# Patient Record
Sex: Male | Born: 1955
Health system: Southern US, Community
[De-identification: ages and names within clinical notes are randomized; demographics above are authoritative.]

## PROBLEM LIST (undated history)

## (undated) DIAGNOSIS — Z8719 Personal history of other diseases of the digestive system: Secondary | ICD-10-CM

## (undated) DIAGNOSIS — R51 Headache: Secondary | ICD-10-CM

## (undated) DIAGNOSIS — N400 Enlarged prostate without lower urinary tract symptoms: Secondary | ICD-10-CM

## (undated) DIAGNOSIS — K219 Gastro-esophageal reflux disease without esophagitis: Secondary | ICD-10-CM

## (undated) DIAGNOSIS — R519 Headache, unspecified: Secondary | ICD-10-CM

## (undated) DIAGNOSIS — K5792 Diverticulitis of intestine, part unspecified, without perforation or abscess without bleeding: Secondary | ICD-10-CM

## (undated) DIAGNOSIS — M1990 Unspecified osteoarthritis, unspecified site: Secondary | ICD-10-CM

## (undated) DIAGNOSIS — J449 Chronic obstructive pulmonary disease, unspecified: Secondary | ICD-10-CM

## (undated) HISTORY — DX: Unspecified osteoarthritis, unspecified site: M19.90

## (undated) HISTORY — DX: Diverticulitis of intestine, part unspecified, without perforation or abscess without bleeding: K57.92

## (undated) HISTORY — PX: NO PAST SURGERIES: SHX2092

## (undated) HISTORY — DX: Chronic obstructive pulmonary disease, unspecified: J44.9

---

## 2001-07-24 ENCOUNTER — Encounter: Admission: RE | Admit: 2001-07-24 | Discharge: 2001-10-22 | Payer: Self-pay | Admitting: Family Medicine

## 2002-02-12 ENCOUNTER — Encounter: Admission: RE | Admit: 2002-02-12 | Discharge: 2002-05-13 | Payer: Self-pay | Admitting: Family Medicine

## 2002-08-04 ENCOUNTER — Ambulatory Visit (HOSPITAL_COMMUNITY): Admission: RE | Admit: 2002-08-04 | Discharge: 2002-08-04 | Payer: Self-pay | Admitting: Family Medicine

## 2002-08-05 ENCOUNTER — Ambulatory Visit (HOSPITAL_BASED_OUTPATIENT_CLINIC_OR_DEPARTMENT_OTHER): Admission: RE | Admit: 2002-08-05 | Discharge: 2002-08-05 | Payer: Self-pay | Admitting: Family Medicine

## 2002-08-13 ENCOUNTER — Encounter: Admission: RE | Admit: 2002-08-13 | Discharge: 2002-11-11 | Payer: Self-pay | Admitting: Family Medicine

## 2002-11-25 ENCOUNTER — Emergency Department (HOSPITAL_COMMUNITY): Admission: EM | Admit: 2002-11-25 | Discharge: 2002-11-25 | Payer: Self-pay | Admitting: Emergency Medicine

## 2002-11-25 ENCOUNTER — Encounter: Payer: Self-pay | Admitting: Emergency Medicine

## 2012-09-11 ENCOUNTER — Emergency Department (HOSPITAL_COMMUNITY): Payer: Worker's Compensation

## 2012-09-11 ENCOUNTER — Encounter (HOSPITAL_COMMUNITY): Payer: Self-pay | Admitting: Family Medicine

## 2012-09-11 ENCOUNTER — Emergency Department (HOSPITAL_COMMUNITY)
Admission: EM | Admit: 2012-09-11 | Discharge: 2012-09-11 | Disposition: A | Discharge status: Home / Self Care | Payer: Worker's Compensation | Attending: Emergency Medicine | Admitting: Emergency Medicine

## 2012-09-11 DIAGNOSIS — Y9389 Activity, other specified: Secondary | ICD-10-CM | POA: Insufficient documentation

## 2012-09-11 DIAGNOSIS — F17200 Nicotine dependence, unspecified, uncomplicated: Secondary | ICD-10-CM | POA: Insufficient documentation

## 2012-09-11 DIAGNOSIS — Z23 Encounter for immunization: Secondary | ICD-10-CM | POA: Insufficient documentation

## 2012-09-11 DIAGNOSIS — Y9289 Other specified places as the place of occurrence of the external cause: Secondary | ICD-10-CM | POA: Insufficient documentation

## 2012-09-11 DIAGNOSIS — Y990 Civilian activity done for income or pay: Secondary | ICD-10-CM | POA: Insufficient documentation

## 2012-09-11 DIAGNOSIS — S61219A Laceration without foreign body of unspecified finger without damage to nail, initial encounter: Secondary | ICD-10-CM

## 2012-09-11 DIAGNOSIS — S61209A Unspecified open wound of unspecified finger without damage to nail, initial encounter: Secondary | ICD-10-CM | POA: Insufficient documentation

## 2012-09-11 DIAGNOSIS — W230XXA Caught, crushed, jammed, or pinched between moving objects, initial encounter: Secondary | ICD-10-CM | POA: Insufficient documentation

## 2012-09-11 DIAGNOSIS — S62609B Fracture of unspecified phalanx of unspecified finger, initial encounter for open fracture: Secondary | ICD-10-CM | POA: Insufficient documentation

## 2012-09-11 MED ORDER — CEPHALEXIN 500 MG PO CAPS: 500.0000 mg | ORAL_CAPSULE | Freq: Four times a day (QID) | ORAL | Status: DC

## 2012-09-11 MED ORDER — OXYCODONE-ACETAMINOPHEN 5-325 MG PO TABS
2.0000 | ORAL_TABLET | Freq: Once | ORAL | Status: AC
  Administered 2012-09-11: 2 via ORAL
  Filled 2012-09-11: qty 2

## 2012-09-11 MED ORDER — TETANUS-DIPHTH-ACELL PERTUSSIS 5-2.5-18.5 LF-MCG/0.5 IM SUSP
0.5000 mL | Freq: Once | INTRAMUSCULAR | Status: AC
  Administered 2012-09-11: 0.5 mL via INTRAMUSCULAR
  Filled 2012-09-11 (×2): qty 0.5

## 2012-09-11 MED ORDER — HYDROCODONE-ACETAMINOPHEN 5-325 MG PO TABS: 1.0000 | ORAL_TABLET | Freq: Four times a day (QID) | ORAL | Status: DC | PRN

## 2012-09-11 NOTE — ED Notes (Signed)
MD at bedside. 

## 2012-09-11 NOTE — ED Notes (Signed)
Per pt closed finger in a door at work. Pt has lac to left small finger. Still bleeding.

## 2012-09-11 NOTE — ED Provider Notes (Signed)
History     CSN: [id number]  Arrival date & time 09/11/12  1229   First MD Initiated Contact with Patient 09/11/12 1340      Chief Complaint  Patient presents with  . Extremity Laceration    (Consider location/radiation/quality/duration/timing/severity/associated sxs/prior treatment) HPI Comments: Patient is a 57 year old male who presents for pain to his distal left fifth finger. Patient states that symptoms began 2 hours ago after closing his finger in a door at work. Patient states the pain is constant and throbbing in nature, aggravated with palpation, and without alleviating factors. Patient with laceration to distal phalanx 2/2 mechanical injury. Bleeding controlled. Patient denies fevers, joint swelling, numbness or tingling in his distal phalanx, and weakness in his right hand or fingers. Patient unsure of the date his last tetanus shot.  The history is provided by the patient. No language interpreter was used.    History reviewed. No pertinent past medical history.  History reviewed. No pertinent past surgical history.  History reviewed. No pertinent family history.  History  Substance Use Topics  . Smoking status: Current Every Day Smoker  . Smokeless tobacco: Not on file  . Alcohol Use: No     Review of Systems  Constitutional: Negative for fever.  Musculoskeletal: Positive for myalgias and arthralgias. Negative for joint swelling.  Skin: Positive for wound. Negative for color change and pallor.  Neurological: Negative for weakness and numbness.  All other systems reviewed and are negative.    Allergies  Review of patient's allergies indicates no known allergies.  Home Medications   Current Outpatient Rx  Name  Route  Sig  Dispense  Refill  . acetaminophen (TYLENOL) 325 MG tablet   Oral   Take by mouth every 6 (six) hours as needed for pain.         . Multiple Vitamins-Minerals (MULTIVITAMIN WITH MINERALS) tablet   Oral   Take 1 tablet by mouth  daily.         . cephALEXin (KEFLEX) 500 MG capsule   Oral   Take 1 capsule (500 mg total) by mouth 4 (four) times daily.   40 capsule   0   . HYDROcodone-acetaminophen (NORCO/VICODIN) 5-325 MG per tablet   Oral   Take 1 tablet by mouth every 6 (six) hours as needed for pain.   15 tablet   0     BP 143/76  Pulse 73  Temp(Src) 98.1 F (36.7 C) (Oral)  Resp 16  SpO2 99%  Physical Exam  Nursing note and vitals reviewed. Constitutional: He is oriented to person, place, and time. He appears well-developed and well-nourished. No distress.  HENT:  Head: Normocephalic and atraumatic.  Eyes: Conjunctivae and EOM are normal. No scleral icterus.  Neck: Normal range of motion.  Cardiovascular: Normal rate, regular rhythm and intact distal pulses.   Distal radial pulses 2+ bilaterally. Capillary refill normal and RUE and L 5th finger.   Pulmonary/Chest: Effort normal. No respiratory distress.  Musculoskeletal: Normal range of motion. He exhibits tenderness.  3 cm laceration to the fifth left distal phalanx extending from the lateral edge of the nailbed to finger pad distal to DIP joint. There is no pallor and capillary refill is normal. 5/5 strength against resistance of FDP, FDS, and extensors. +TTP  Neurological: He is alert and oriented to person, place, and time.  Equal grip strength bilaterally with no sensory or motor deficits appreciated  Skin: Skin is warm and dry. No rash noted. He is not diaphoretic. No  erythema. No pallor.  Psychiatric: He has a normal mood and affect. His behavior is normal.    ED Course  Procedures (including critical care time)  Labs Reviewed - No data to display Dg Finger Little Left  09/11/2012   *RADIOLOGY REPORT*  Clinical Data: Recent traumatic injury to the distal fifth digit  LEFT LITTLE FINGER 2+V  Comparison: None.  Findings: There is soft tissue abnormality consistent with the patient's given clinical history.  Fracture of the phalangeal  tuft laterally is noted with mild displacement.  No other focal abnormality is seen.  IMPRESSION: Fifth Distal phalangeal tuft fracture with associated soft tissue injury.   Original Report Authenticated By: Alcide Clever, M.D.   LACERATION REPAIR Performed by: Jari Favre PA-S Authorized by: Antony Madura Consent: Verbal consent obtained. Risks and benefits: risks, benefits and alternatives were discussed Consent given by: patient Patient identity confirmed: provided demographic data Prepped and Draped in normal sterile fashion Wound explored  Laceration Location: Distal L 5th finger  Laceration Length: 3cm  No Foreign Bodies seen or palpated  Anesthesia: digital block  Local anesthetic: lidocaine 2% without epinephrine  Anesthetic total: 3.5 ml  Irrigation method: syringe Amount of cleaning: standard  Skin closure: 4-0 Chromic  Number of sutures: 7  Technique: 2 subcutaneous and 5 simple interrupted  Patient tolerance: Patient tolerated the procedure well with no immediate complications.   1. Finger laceration, initial encounter   2. Open fracture of distal phalangeal tuft, initial encounter     MDM  Uncomplicated finger laceration with open tuft's fracture of L little finger. Patient neurovascularly intact with 5 out of 5 strength against resistance of FDS, FDP, and extensors. There is no pallor, pulselessness, poikilothermia, or paresthesias appreciated. Laceration closed with 4-0 chromic sutures in ED and dressed with gauze dressing. Patient appropriate for discharge with hand specialist followup and course of Keflex to prevent wound infection and osteomyelitis. Norco prescribed as needed for pain control. Indications for ED return provided. Patient verbalizes comfort and understanding with plan with no unaddressed concerns.        Antony Madura, PA-C 09/11/12 [phone]

## 2012-09-11 NOTE — ED Notes (Signed)
PT comfortable with d/c and f/u instructions. Prescriptions x2 

## 2012-09-12 NOTE — ED Provider Notes (Signed)
Medical screening examination/treatment/procedure(s) were performed by non-physician practitioner and as supervising physician I was immediately available for consultation/collaboration.   Carleene Cooper III, MD 09/12/12 1007

## 2013-11-04 ENCOUNTER — Emergency Department (HOSPITAL_COMMUNITY)
Admission: EM | Admit: 2013-11-04 | Discharge: 2013-11-04 | Disposition: A | Discharge status: Home / Self Care | Payer: 59 | Attending: Emergency Medicine | Admitting: Emergency Medicine

## 2013-11-04 ENCOUNTER — Encounter (HOSPITAL_COMMUNITY): Payer: Self-pay | Admitting: Emergency Medicine

## 2013-11-04 ENCOUNTER — Emergency Department (HOSPITAL_COMMUNITY): Payer: 59

## 2013-11-04 DIAGNOSIS — F17200 Nicotine dependence, unspecified, uncomplicated: Secondary | ICD-10-CM | POA: Diagnosis not present

## 2013-11-04 DIAGNOSIS — R079 Chest pain, unspecified: Secondary | ICD-10-CM | POA: Insufficient documentation

## 2013-11-04 DIAGNOSIS — B359 Dermatophytosis, unspecified: Secondary | ICD-10-CM | POA: Diagnosis not present

## 2013-11-04 DIAGNOSIS — Z79899 Other long term (current) drug therapy: Secondary | ICD-10-CM | POA: Insufficient documentation

## 2013-11-04 DIAGNOSIS — R071 Chest pain on breathing: Secondary | ICD-10-CM | POA: Insufficient documentation

## 2013-11-04 LAB — CBC
HEMATOCRIT: 46.1 % (ref 39.0–52.0)
Hemoglobin: 15.5 g/dL (ref 13.0–17.0)
MCH: 30.9 pg (ref 26.0–34.0)
MCHC: 33.6 g/dL (ref 30.0–36.0)
MCV: 91.8 fL (ref 78.0–100.0)
Platelets: 260 10*3/uL (ref 150–400)
RBC: 5.02 MIL/uL (ref 4.22–5.81)
RDW: 13.2 % (ref 11.5–15.5)
WBC: 9.6 10*3/uL (ref 4.0–10.5)

## 2013-11-04 LAB — I-STAT TROPONIN, ED
TROPONIN I, POC: 0 ng/mL (ref 0.00–0.08)
Troponin i, poc: 0 ng/mL (ref 0.00–0.08)

## 2013-11-04 LAB — BASIC METABOLIC PANEL
Anion gap: 14 (ref 5–15)
BUN: 16 mg/dL (ref 6–23)
CHLORIDE: 105 meq/L (ref 96–112)
CO2: 26 mEq/L (ref 19–32)
Calcium: 9.4 mg/dL (ref 8.4–10.5)
Creatinine, Ser: 1 mg/dL (ref 0.50–1.35)
GFR calc Af Amer: >90 mL/min (ref >90)
GFR calc non Af Amer: 80 mL/min — ABNORMAL LOW (ref >90)
GLUCOSE: 99 mg/dL (ref 70–99)
Potassium: 4.6 mEq/L (ref 3.7–5.3)
Sodium: 145 mEq/L (ref 137–147)

## 2013-11-04 MED ORDER — KETOROLAC TROMETHAMINE 60 MG/2ML IM SOLN
60.0000 mg | Freq: Once | INTRAMUSCULAR | Status: AC
  Administered 2013-11-04: 60 mg via INTRAMUSCULAR
  Filled 2013-11-04: qty 2

## 2013-11-04 MED ORDER — CLOTRIMAZOLE 1 % EX CREA: TOPICAL_CREAM | CUTANEOUS | Status: DC

## 2013-11-04 MED ORDER — OXYCODONE-ACETAMINOPHEN 5-325 MG PO TABS: 1.0000 | ORAL_TABLET | ORAL | Status: DC | PRN

## 2013-11-04 NOTE — Discharge Instructions (Signed)
Chest Pain (Nonspecific) °It is often hard to give a specific diagnosis for the cause of chest pain. There is always a chance that your pain could be related to something serious, such as a heart attack or a blood clot in the lungs. You need to follow up with your health care provider for further evaluation. °CAUSES  °· Heartburn. °· Pneumonia or bronchitis. °· Anxiety or stress. °· Inflammation around your heart (pericarditis) or lung (pleuritis or pleurisy). °· A blood clot in the lung. °· A collapsed lung (pneumothorax). It can develop suddenly on its own (spontaneous pneumothorax) or from trauma to the chest. °· Shingles infection (herpes zoster virus). °The chest wall is composed of bones, muscles, and cartilage. Any of these can be the source of the pain. °· The bones can be bruised by injury. °· The muscles or cartilage can be strained by coughing or overwork. °· The cartilage can be affected by inflammation and become sore (costochondritis). °DIAGNOSIS  °Lab tests or other studies may be needed to find the cause of your pain. Your health care provider may have you take a test called an ambulatory electrocardiogram (ECG). An ECG records your heartbeat patterns over a 24-hour period. You may also have other tests, such as: °· Transthoracic echocardiogram (TTE). During echocardiography, sound waves are used to evaluate how blood flows through your heart. °· Transesophageal echocardiogram (TEE). °· Cardiac monitoring. This allows your health care provider to monitor your heart rate and rhythm in real time. °· Holter monitor. This is a portable device that records your heartbeat and can help diagnose heart arrhythmias. It allows your health care provider to track your heart activity for several days, if needed. °· Stress tests by exercise or by giving medicine that makes the heart beat faster. °TREATMENT  °· Treatment depends on what may be causing your chest pain. Treatment may include: °¨ Acid blockers for  heartburn. °¨ Anti-inflammatory medicine. °¨ Pain medicine for inflammatory conditions. °¨ Antibiotics if an infection is present. °· You may be advised to change lifestyle habits. This includes stopping smoking and avoiding alcohol, caffeine, and chocolate. °· You may be advised to keep your head raised (elevated) when sleeping. This reduces the chance of acid going backward from your stomach into your esophagus. °Most of the time, nonspecific chest pain will improve within 2-3 days with rest and mild pain medicine.  °HOME CARE INSTRUCTIONS  °· If antibiotics were prescribed, take them as directed. Finish them even if you start to feel better. °· For the next few days, avoid physical activities that bring on chest pain. Continue physical activities as directed. °· Do not use any tobacco products, including cigarettes, chewing tobacco, or electronic cigarettes. °· Avoid drinking alcohol. °· Only take medicine as directed by your health care provider. °· Follow your health care provider's suggestions for further testing if your chest pain does not go away. °· Keep any follow-up appointments you made. If you do not go to an appointment, you could develop lasting (chronic) problems with pain. If there is any problem keeping an appointment, call to reschedule. °SEEK MEDICAL CARE IF:  °· Your chest pain does not go away, even after treatment. °· You have a rash with blisters on your chest. °· You have a fever. °SEEK IMMEDIATE MEDICAL CARE IF:  °· You have increased chest pain or pain that spreads to your arm, neck, jaw, back, or abdomen. °· You have shortness of breath. °· You have an increasing cough, or you cough   up blood. °· You have severe back or abdominal pain. °· You feel nauseous or vomit. °· You have severe weakness. °· You faint. °· You have chills. °This is an emergency. Do not wait to see if the pain will go away. Get medical help at once. Call your local emergency services (911 in [place] Do not drive  yourself to the hospital. °MAKE SURE YOU:  °· Understand these instructions. °· Will watch your condition. °· Will get help right away if you are not doing well or get worse. °Document Released: 12/21/2004 Document Revised: 03/18/2013 Document Reviewed: 10/17/2007 °ExitCare® Patient Information ©2015 [employer]. This information is not intended to replace advice given to you by your health care provider. Make sure you discuss any questions you have with your health care provider. ° ° °Emergency Department Resource Guide °1) Find a Doctor and Pay Out of Pocket °Although you won't have to find out who is covered by your insurance plan, it is a good idea to ask around and get recommendations. You will then need to call the office and see if the doctor you have chosen will accept you as a new patient and what types of options they offer for patients who are self-pay. Some doctors offer discounts or will set up payment plans for their patients who do not have insurance, but you will need to ask so you aren't surprised when you get to your appointment. ° °2) Contact Your Local Health Department °Not all health departments have doctors that can see patients for sick visits, but many do, so it is worth a call to see if yours does. If you don't know where your local health department is, you can check in your phone book. The CDC also has a tool to help you locate your state's health department, and many state websites also have listings of all of their local health departments. ° °3) Find a Walk-in Clinic °If your illness is not likely to be very severe or complicated, you may want to try a walk in clinic. These are popping up all over the country in pharmacies, drugstores, and shopping centers. They're usually staffed by nurse practitioners or physician assistants that have been trained to treat common illnesses and complaints. They're usually fairly quick and inexpensive. However, if you have serious medical issues or  chronic medical problems, these are probably not your best option. ° °No Primary Care Doctor: °- Call [facility] at  832-8000 - they can help you locate a primary care doctor that  accepts your insurance, provides certain services, etc. °- Physician Referral Service- 1-800-533-3463 ° °Chronic Pain Problems: °Organization         Address  Phone   Notes  ° [facility]  (336) 297-2271 Patients need to be referred by their primary care doctor.  ° °Medication Assistance: °Organization         Address  Phone   Notes  °[place] [facility] [address] °[place], [place] [address] (336) 641-8030 --Must be a resident of [place] °-- Must have NO insurance coverage whatsoever (no Medicaid/ Medicare, etc.) °-- The pt. MUST have a primary care doctor that directs their care regularly and follows them in the community °  °[facility]  (866) 331-1348   °[employer]  (888) 892-1162   ° °Agencies that provide inexpensive medical care: °Organization         Address  Phone   Notes  °[facility]  (  336) 832-8035   °[facility]    (336) 832-7272   °[facility] [address] °[place], [place] [address] (336) 832-4777   °[facility] [address], °[place] (336) 271-4999   °[facility]    (336) 373-0678   °[facility]    (336) 272-1050   °[facility] ° [address], [place] Phone:  (336) 832-4444, Fax:  (336) 832-4440 Hours of Operation:  9 am - 6 pm, M-F.  Also accepts Medicaid/Medicare and self-pay.  °[facility] ° [address], [place] Phone: (336) 832-3150, Fax: (336) [phone]. Hours of Operation:  8:30 am - 5:30 pm, M-F.  Also accepts Medicaid and self-pay.  °[facility] [place] [address], [place] Phone: (336) 878-6027   °[facility] [address], [place], [place]  (336)723-1848, Ext. 123 Mondays & Thursdays: 7-9 AM.  First 15 patients are seen on a first come, first serve basis. °  ° °Medicaid-accepting [place] Providers: ° °Organization         Address  Phone   Notes  °[facility] [address], [place] (336) 641-2100 Also accepts self-pay patients.  °[facility] [address], [place] ° (336) 856-9996   °[facility] [address], [place] (336) 288-8857   °[facility] [address] [place] [address], [place] (336) 299-7000   °Veita Bland [address], [place]  ° (336) 373-1557 Only accepts [place] Access Medicaid patients after they have their name applied to their card.  ° °Self-Pay (no insurance) in [place]: ° °Organization         Address  Phone   Notes  °Sickle Cell Patients, [facility] [address], [place] (336) 832-1970   °[facility] [address], [place] (336) 832-4400   ° [facility] ° [address], [place] (336) 992-4800   °[facility]/Dr. Osei-Bonsu ° [address] [place] [address], [place] or [address], [place] (336) [phone] Phone number for both [place] and [place] locations is the same.  °[facility] [address], [place] (336) 299-0000   °[facility] [address] [place] [address], [place] or [address] (336) 852-7530 °(336) 878-2260   °[facility] [address], [place] (336) 350-1642, phone; (336) 294-5005, fax Sees patients 1st and 3rd Saturday of every month.  Must not qualify for public or private insurance (i.e. Medicaid, Medicare, [place] Health Choice, Veterans' Benefits) • Household income should be no more than 200% of the poverty level •The clinic cannot treat you if you are pregnant or think you are pregnant • Sexually transmitted  diseases are not treated at the clinic.  ° ° °Dental Care: °Organization         Address  Phone  Notes  °[facility] [address],  (336) 641-6152 Accepts children up to age 21 who are enrolled in Medicaid or [place] Health Choice; pregnant women with a Medicaid card; and children who have applied for Medicaid or  Health Choice, but were declined, whose parents can pay a reduced fee at time of service.  °[facility] [place]    [address], [place] (336) 641-7733 Accepts children up to age 21 who are enrolled in Medicaid or [place] Health Choice; pregnant women with a Medicaid card; and children who have applied for Medicaid or [place] Health Choice, but were declined, whose parents can pay a reduced fee at time of service.  °[facility] ° [address], [place] (336) 641-4533 Patients are seen by appointment only. Walk-ins are not accepted. [facility] will see patients 18 years of age and older. °Monday - Tuesday (8am-5pm) °Most Wednesdays (8:30-5pm) °$30 per visit, cash only  °[facility] ° [address], [place] (336) 641-4533 Patients are seen by appointment only. Walk-ins are not accepted. [facility] will see patients 18 years of age and older. °One Wednesday Evening (Monthly: Volunteer Based).  $30 per visit, cash only  °[facility]  (919) 537-3737 for adults; Children under age 4, call Graduate Pediatric Dentistry at ([phone]) [phone]. Children aged 4-14, please call (919) [phone] to request a pediatric application. ° Dental services are provided in all areas of dental care including fillings, crowns and bridges, complete and partial dentures, implants, gum treatment, root canals, and extractions. Preventive care is also provided. Treatment is provided to both adults and children. °Patients are selected via a  lottery and there is often a waiting list. °  °[facility] [address], °[place] ° (336) 763-8833 [email or web address] °  °[facility] [address], [place], [place] (336)723-1848, Ext. 123 Second and Fourth Thursday of each month, opens at 6:30 AM; Clinic ends at 9 AM.  Patients are seen on a first-come first-served basis, and a limited number are seen during each clinic.  ° °[facility] ° [address], [place], [place] (336) 723-7904   Eligibility Requirements °You must have lived in [place], [place], or [place] counties for at least the last three months. °  You cannot be eligible for state or federal sponsored healthcare insurance, including Veterans Administration, Medicaid, or Medicare. °  You generally cannot be eligible for healthcare insurance through your employer.  °  How to apply: °Eligibility screenings are held every Tuesday and Wednesday afternoon from 1:00 pm until 4:00 pm. You do not need an appointment for the interview!  °[facility] [address], [place], [place] 336-631-2330   °[facility]  336-342-8273   °[facility]  336-703-3100   °[facility]  336-570-6415   ° °Behavioral Health Resources in the Community: °Intensive Outpatient Programs °Organization         Address  Phone  Notes  °[facility] [address], [place], [place] 336-878-6098   °[facility] [address], [place], [place] 336-832-9800   °[facility] [address], [place], [place] ° 336-882-2125   °[facility] [address],  °[place], [place] 1-800-853-5163 or 336-641-4981   °Substance Abuse Resources °Organization         Address  Phone  Notes  °[facility]  336-882-2125   °[facility]  336-784-9470   °[facility] [place] [facility]  336-285-9073   °[facility]  336-845-3988   °Residential &  Outpatient Substance Abuse Program  1-800-659-3381   °Psychological Services °Organization         Address  Phone  Notes  °[facility]  336- 832-9600   °  [facility]  336- 378-7881   °[facility] [address], [place] 1-800-853-5163 or 336-641-4981   ° °Mobile Crisis Teams °Organization         Address  Phone  Notes  °[facility], Mobile Crisis Care Unit  1-877-626-1772   °Assertive °[facility] ° [address] [place], [place] 336-834-9664   °Sharon DeEsch [address] °[place] 336-554-5454   ° °Self-Help/Support Groups °Organization         Address  Phone             Notes  °[facility] [place] - variety of support groups  336- 373-1402 Call for more information  °Narcotics Anonymous (NA), [facility] [address], °[place]  2 meetings at this location  ° °Residential Treatment Programs °Organization         Address  Phone  Notes  °[facility] [address],    °[place]  1-866-801-8205   °[facility] ° [address], [place], [place] 704-293-8524   °[facility] [address], [place] 336-845-3988 Admissions: 8am-3pm M-F  °[facility] [address]    °[place], [place] 336-841-1104   °[facility] [address], [place], [place] 336-379-7146   °[facility] [place] [facility] [address]  °[place], [place] 336-285-9073   °[facility] - Intensive Outpatient [address], [place], [place] 336-852-3033   °[facility] [address]  °[place] 1-877-615-2722 or 336-784-9470   °[facility] [address] [place], [place] 336-227-7417 Accepts Medicaid  °[facility] [address]  °[place] 1-800-659-3381 Substance Abuse/Addiction Treatment  ° °[place] Behavioral Health Resources °Organization          Address  Phone  Notes  °[facility]  (888) 581-9988   °Julie Brannon, PhD [address] [place], [place]   (336) 349-5553 or (336) 951-0000   °[facility]   [address] °[place], [place] (336) 349-4454   °[facility] [address], [place], [place] (336) 342-8316 Insurance/Medicaid/sponsorship through [facility]  °[facility] [address]                                    [place], [place] (336) 342-8316 Therapy/tele-psych/case  °[facility] [address]  ° [place], [place] (336) 349-2233    °Dr. Arfeen  (336) 349-4544   °[facility] [place]  [employer] [place] [facility] 1) [address], [place] °2) [address], [place] °3)  [address], [place] (336) 349-3220 °(336) 342-7768 ° °(336) 342-8140   °[place] [facility] (336) 342-1394 or (336) 342-3537 (After Hours)    ° ° ° °

## 2013-11-04 NOTE — ED Notes (Signed)
Pt c/o left sided CP worse with some movements and inspiration; pt sts some SOB

## 2013-11-04 NOTE — ED Notes (Signed)
MD in to evaluate rash.

## 2013-11-04 NOTE — ED Notes (Signed)
Pt reports he has had a rash on lower back, buttocks and upper posterior legs x 6 months, baseball size red raised rash on right side near axillary area since Friday night.  Itching, sometimes burns.

## 2013-11-05 NOTE — ED Provider Notes (Addendum)
CSN: [id number]     Arrival date & time 11/04/13  1705 History   First MD Initiated Contact with Patient 11/04/13 1842     Chief Complaint  Patient presents with  . Chest Pain     (Consider location/radiation/quality/duration/timing/severity/associated sxs/prior Treatment) Patient is a 58 y.o. male presenting with chest pain. The history is provided by the patient.  Chest Pain Pain location:  L lateral chest Pain quality: aching   Pain radiates to:  Does not radiate Pain radiates to the back: no   Pain severity:  Severe Onset quality:  Sudden Timing:  Constant Progression:  Unchanged Chronicity:  New Context comment:  Was pulling himself into his truck with his L arm and began having L sided chest pains Relieved by:  Rest Worsened by:  Movement Ineffective treatments:  None tried Associated symptoms: no abdominal pain, no back pain, no cough, no dizziness, no fever, no shortness of breath and not vomiting     History reviewed. No pertinent past medical history. History reviewed. No pertinent past surgical history. History reviewed. No pertinent family history. History  Substance Use Topics  . Smoking status: Current Every Day Smoker  . Smokeless tobacco: Not on file  . Alcohol Use: Yes     Comment: occ    Review of Systems  Constitutional: Negative for fever and chills.  Respiratory: Negative for cough and shortness of breath.   Cardiovascular: Positive for chest pain.  Gastrointestinal: Negative for vomiting and abdominal pain.  Musculoskeletal: Negative for back pain.  Neurological: Negative for dizziness.  All other systems reviewed and are negative.     Allergies  Review of patient's allergies indicates no known allergies.  Home Medications   Prior to Admission medications   Medication Sig Start Date End Date Taking? Authorizing Provider  Ascorbic Acid (VITAMIN C) 1000 MG tablet Take 1,000 mg by mouth daily.   Yes Historical Provider, MD   aspirin-acetaminophen-caffeine (EXCEDRIN MIGRAINE) [phone] MG per tablet Take 1 tablet by mouth every 6 (six) hours as needed for headache.   Yes Historical Provider, MD  Multiple Vitamins-Minerals (MULTIVITAMIN WITH MINERALS) tablet Take 1 tablet by mouth daily.   Yes Historical Provider, MD  clotrimazole (LOTRIMIN) 1 % cream Apply to affected area 2 times daily 11/04/13   Elwin MochaBlair Durward Matranga, MD  oxyCODONE-acetaminophen (PERCOCET) 5-325 MG per tablet Take 1 tablet by mouth every 4 (four) hours as needed. 11/04/13   Elwin MochaBlair Jhayden Demuro, MD   BP 120/70  Pulse 55  Temp(Src) 98.5 F (36.9 C) (Oral)  Resp 16  SpO2 96% Physical Exam  Constitutional: He is oriented to person, place, and time. He appears well-developed and well-nourished. No distress.  HENT:  Head: Normocephalic and atraumatic.  Mouth/Throat: Oropharynx is clear and moist. No oropharyngeal exudate.  Eyes: EOM are normal. Pupils are equal, round, and reactive to light.  Neck: Normal range of motion. Neck supple.  Cardiovascular: Normal rate and regular rhythm.  Exam reveals no friction rub.   No murmur heard. Pulmonary/Chest: Effort normal and breath sounds normal. No respiratory distress. He has no wheezes. He has no rales. He exhibits tenderness (L chest wall).  Abdominal: Soft. He exhibits no distension. There is no tenderness. There is no rebound.  Musculoskeletal: Normal range of motion. He exhibits no edema.  Neurological: He is alert and oriented to person, place, and time.  Skin: Skin is warm. Rash (No left chest wall rash. Does have annular patches of erythematous macules that are excoriated from scratching.. No surrounding cellulitis)  noted. He is not diaphoretic.    ED Course  Procedures (including critical care time) Labs Review Labs Reviewed  BASIC METABOLIC PANEL - Abnormal; Notable for the following:    GFR calc non Af Amer 80 (*)    All other components within normal limits  CBC  I-STAT TROPOININ, ED  Rosezena Sensor, ED    Imaging Review Dg Chest 2 View  11/04/2013   CLINICAL DATA:  Left-sided sharp chest pain. Shortness of breath. Smoker.  EXAM: CHEST  2 VIEW  COMPARISON:  None.  FINDINGS: Mild hyperinflation. Mild convex right thoracic spine curvature. Midline trachea. Normal heart size and mediastinal contours. No pleural effusion or pneumothorax. Mild biapical pleural thickening.  IMPRESSION: Mild hyperinflation/COPD.  No acute superimposed process.   Electronically Signed   By: Jeronimo Greaves M.D.   On: 11/04/2013 17:56     EKG Interpretation   Date/Time:  Tuesday November 04 2013 17:07:55 EDT Ventricular Rate:  75 PR Interval:  138 QRS Duration: 84 QT Interval:  356 QTC Calculation: 397 R Axis:   101 Text Interpretation:  Normal sinus rhythm Rightward axis Borderline ECG  Similar to prior Confirmed by Gwendolyn Grant  MD, Robin Petrakis ([id number]) on 11/05/2013  3:44:22 PM      MDM   Final diagnoses:  Chest pain, unspecified chest pain type  Ringworm    [name] here with L sided chest pain that began when using his L arm to get into his truck. Worse with movement, palpation. EKG clear. On exam, has L sided chest pain with palpation, diffusely through L lateral chest. No fevers, cough, N/V. Clear lungs. Exam and history c/w left chest wall pain. Serial troponins ok.  Also has ringworm on lower back, R lateral chest, given anti-fungal Rx. Stable for discharge.     Elwin Mocha, MD 11/05/13 [phone]

## 2017-06-05 ENCOUNTER — Ambulatory Visit: Payer: Self-pay | Admitting: Urology

## 2017-06-05 ENCOUNTER — Encounter: Payer: Self-pay | Admitting: Urology

## 2017-07-27 ENCOUNTER — Ambulatory Visit (INDEPENDENT_AMBULATORY_CARE_PROVIDER_SITE_OTHER): Payer: BLUE CROSS/BLUE SHIELD | Admitting: Urology

## 2017-07-27 ENCOUNTER — Encounter: Payer: Self-pay | Admitting: Urology

## 2017-07-27 VITALS — BP 143/82 | HR 68 | Ht 77.0 in | Wt 209.4 lb

## 2017-07-27 DIAGNOSIS — R3129 Other microscopic hematuria: Secondary | ICD-10-CM

## 2017-07-27 DIAGNOSIS — R39198 Other difficulties with micturition: Secondary | ICD-10-CM

## 2017-07-27 DIAGNOSIS — Z125 Encounter for screening for malignant neoplasm of prostate: Secondary | ICD-10-CM

## 2017-07-27 DIAGNOSIS — N400 Enlarged prostate without lower urinary tract symptoms: Secondary | ICD-10-CM

## 2017-07-27 LAB — BLADDER SCAN AMB NON-IMAGING: Scan Result: 132

## 2017-07-27 LAB — URINALYSIS, COMPLETE
BILIRUBIN UA: NEGATIVE
Glucose, UA: NEGATIVE
Ketones, UA: NEGATIVE
Leukocytes, UA: NEGATIVE
Nitrite, UA: NEGATIVE
PH UA: 6.5 (ref 5.0–7.5)
Protein, UA: NEGATIVE
Specific Gravity, UA: 1.015 (ref 1.005–1.030)
UUROB: 0.2 mg/dL (ref 0.2–1.0)

## 2017-07-27 LAB — MICROSCOPIC EXAMINATION
BACTERIA UA: NONE SEEN
Epithelial Cells (non renal): NONE SEEN /hpf (ref 0–10)
WBC UA: NONE SEEN /HPF (ref 0–5)

## 2017-07-27 MED ORDER — TAMSULOSIN HCL 0.4 MG PO CAPS: 0.4000 mg | ORAL_CAPSULE | Freq: Every day | ORAL | 11 refills | Status: DC

## 2017-07-27 NOTE — Progress Notes (Signed)
07/27/2017 10:44 AM   Ryan Gallagher 03/04/1956 [id number]  Referring provider: No referring provider defined for this encounter.  CC: Trouble urinating  HPI: The patient is a 62 year old gentleman who presents today with chief complaint of trouble urinating.  1.  BPH The patient complains of trouble with urination that began  and worsened over the last year.  His biggest complaint is nocturia x2.  He also has urinary hesitancy up to 2 to 3 minutes.  He has a very weak stream.  He is unsure if he empties his bladder.  He does have significant urgency especially at night.  He does have occasional urge incontinence as well.  He is very bothered by symptoms and is interested in medication to improve his urinary quality of life.  PVR was 132 cc.   2.  Microscopic hematuria Today's urine analysis shows 3-10 red blood cells per high-powered field.  Patient denies any history of gross hematuria.  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  He has never been worked up for this.  He has no history of nephrolithiasis.  He is a current smoker with a 20-pack-year history.  No industrial chemical exposure.  3.  Prostate cancer screening The patient has never had a PSA or DRE.  PMH: History reviewed. No pertinent past medical history.  Surgical History: History reviewed. No pertinent surgical history.  Home Medications:  Allergies as of 07/27/2017   No Known Allergies     Medication List        Accurate as of 07/27/17 10:44 AM. Always use your most recent med list.          aspirin-acetaminophen-caffeine 250-250-65 MG tablet Commonly known as:  EXCEDRIN MIGRAINE Take 1 tablet by mouth every 6 (six) hours as needed for headache.   clotrimazole 1 % cream Commonly known as:  LOTRIMIN Apply to affected area 2 times daily   multivitamin with minerals tablet Take 1 tablet by mouth daily.   tamsulosin 0.4 MG Caps capsule Commonly known as:  FLOMAX Take 1  capsule (0.4 mg total) by mouth daily.   vitamin C 1000 MG tablet Take 1,000 mg by mouth daily.       Allergies: No Known Allergies  Family History: Family History  Problem Relation Age of Onset  . Prostate cancer Neg Hx   . Bladder Cancer Neg Hx   . Kidney cancer Neg Hx     Social History:  reports that he has been smoking.  He has never used smokeless tobacco. He reports that he drinks alcohol. He reports that he does not use drugs.  ROS: UROLOGY Frequent Urination?: Yes Hard to postpone urination?: Yes Burning/pain with urination?: No Get up at night to urinate?: Yes Leakage of urine?: Yes Urine stream starts and stops?: Yes Trouble starting stream?: Yes Do you have to strain to urinate?: Yes Blood in urine?: No Urinary tract infection?: No Sexually transmitted disease?: No Injury to kidneys or bladder?: No Painful intercourse?: No Weak stream?: Yes Erection problems?: No Penile pain?: No  Gastrointestinal Nausea?: No Vomiting?: No Indigestion/heartburn?: No Diarrhea?: No Constipation?: No  Constitutional Fever: No Night sweats?: Yes Weight loss?: No Fatigue?: Yes  Skin Skin rash/lesions?: Yes Itching?: Yes  Eyes Blurred vision?: Yes Double vision?: No  Ears/Nose/Throat Sore throat?: No Sinus problems?: No  Hematologic/Lymphatic Swollen glands?: No Easy bruising?: No  Cardiovascular Leg swelling?: No Chest pain?: No  Respiratory Cough?: Yes Shortness of breath?: No  Endocrine Excessive thirst?: No  Musculoskeletal Back pain?: Yes Joint pain?: Yes  Neurological Headaches?: Yes Dizziness?: Yes  Psychologic Depression?: No Anxiety?: Yes  Physical Exam: BP (!) 143/82 (BP Location: Right Arm, Patient Position: Sitting, Cuff Size: Normal)   Pulse 68   Ht  (1.956 m)   Wt 209 lb 6.4 oz (95 kg)   BMI 24.83 kg/m   Constitutional:  Alert and oriented, No acute distress. HEENT: [place] AT, moist mucus membranes.  Trachea  midline, no masses. Cardiovascular: No clubbing, cyanosis, or edema. Respiratory: Normal respiratory effort, no increased work of breathing. GI: Abdomen is soft, nontender, nondistended, no abdominal masses GU: No CVA tenderness.  Normal phallus.  Testicles descended equally bilaterally.  Benign.  DRE: 40 g benign. Skin: No rashes, bruises or suspicious lesions. Lymph: No cervical or inguinal adenopathy. Neurologic: Grossly intact, no focal deficits, moving all 4 extremities. Psychiatric: Normal mood and affect.  Laboratory Data: Lab Results  Component Value Date   WBC 9.6 11/04/2013   HGB 15.5 11/04/2013   HCT 46.1 11/04/2013   MCV 91.8 11/04/2013   PLT 260 11/04/2013    Lab Results  Component Value Date   CREATININE 1.00 11/04/2013    No results found for: PSA  No results found for: TESTOSTERONE  No results found for: HGBA1C  Urinalysis No results found for: COLORURINE, APPEARANCEUR, LABSPEC, PHURINE, GLUCOSEU, HGBUR, BILIRUBINUR, KETONESUR, PROTEINUR, UROBILINOGEN, NITRITE, LEUKOCYTESUR   Assessment & Plan:    1. BPH We will start the patient on Flomax for his urinary symptoms.  He was warned of the risk of orthostatic hypotension and retrograde ejaculation.  2.  Microscopic hematuria He has never been worked up for this before.  Given his smoking history, he will undergo formal hematuria work-up with a CT urogram followed by office cystoscopy.  3.  Prostate cancer screening DRE unremarkable.  Will check PSA today.  Return for after CT for cysto.  Hildred Laser, MD  [facility] [address] [place], [place] [address] [phone]

## 2017-07-28 LAB — PSA: Prostate Specific Ag, Serum: 2.4 ng/mL (ref 0.0–4.0)

## 2017-07-30 ENCOUNTER — Telehealth: Payer: Self-pay

## 2017-07-30 NOTE — Telephone Encounter (Signed)
-----   Message from Hildred Laser, MD sent at 07/30/2017 10:05 AM EDT ----- Please let patient know PSA is normal. F/u as scheduled. Thanks.   ----- Message ----- From: Vickki Hearing, CMA Sent: 07/30/2017   9:34 AM To: Hildred Laser, MD    ----- Message ----- From: Interface, [facility] Lab Results In Sent: 07/27/2017  11:38 AM To: Jennette [facility]

## 2017-07-30 NOTE — Telephone Encounter (Signed)
Pt informed

## 2017-08-21 ENCOUNTER — Ambulatory Visit: Payer: BLUE CROSS/BLUE SHIELD

## 2017-08-23 ENCOUNTER — Telehealth: Payer: Self-pay | Admitting: Urology

## 2017-08-23 NOTE — Telephone Encounter (Signed)
Patient canceled CT and CT due to the cost. Said that once he gets the CT done he will call back to reschd the cysto. But he has not met his deductible and it will cost him $3000.00 for the CT scan and he can not afford I t.   Sharyn Lull

## 2017-08-24 ENCOUNTER — Other Ambulatory Visit: Payer: BLUE CROSS/BLUE SHIELD

## 2018-03-23 ENCOUNTER — Encounter: Payer: Self-pay | Admitting: Emergency Medicine

## 2018-03-23 ENCOUNTER — Other Ambulatory Visit: Payer: Self-pay

## 2018-03-23 ENCOUNTER — Emergency Department
Admission: EM | Admit: 2018-03-23 | Discharge: 2018-03-24 | Disposition: A | Discharge status: Home / Self Care | Payer: BLUE CROSS/BLUE SHIELD | Attending: Emergency Medicine | Admitting: Emergency Medicine

## 2018-03-23 DIAGNOSIS — F17210 Nicotine dependence, cigarettes, uncomplicated: Secondary | ICD-10-CM | POA: Insufficient documentation

## 2018-03-23 DIAGNOSIS — R339 Retention of urine, unspecified: Secondary | ICD-10-CM

## 2018-03-23 DIAGNOSIS — R079 Chest pain, unspecified: Secondary | ICD-10-CM

## 2018-03-23 DIAGNOSIS — Z79899 Other long term (current) drug therapy: Secondary | ICD-10-CM | POA: Insufficient documentation

## 2018-03-23 DIAGNOSIS — N390 Urinary tract infection, site not specified: Secondary | ICD-10-CM

## 2018-03-23 HISTORY — DX: Benign prostatic hyperplasia without lower urinary tract symptoms: N40.0

## 2018-03-23 NOTE — ED Triage Notes (Signed)
Pt says he's been taking Flomax since June for enlarged prostate; pt says for the last 7-8 hours he's voiding only in small amounts; does not feel like he's emptying his bladder; immense suprapubic pain; feels the need to void frequently

## 2018-03-23 NOTE — ED Notes (Signed)
Pt room att c/o pressure in bladder with hx of BPH,   Bladder scan post void

## 2018-03-23 NOTE — ED Provider Notes (Signed)
[facility] Emergency Department Provider Note   ____________________________________________   First MD Initiated Contact with Patient 03/23/18 2321     (approximate)  I have reviewed the triage vital signs and the nursing notes.   HISTORY  Chief Complaint Urinary Retention    HPI Ryan Gallagher is a 62 y.o. male who presents to the ED from home with a chief complaint of urinary retention.  Patient has a history of BPH, takes Flomax daily.  [facility].  Reports for the last 8 hours or so he is only voiding tiny amounts and does not feel like he is completely emptying his bladder.  Complaining of suprapubic discomfort.  Denies associated fever, chills, chest pain, shortness of breath, dysuria, nausea or vomiting.  Denies recent travel or trauma.  Denies constipation.   Past Medical History:  Diagnosis Date  . Enlarged prostate     There are no active problems to display for this patient.   History reviewed. No pertinent surgical history.  Prior to Admission medications   Medication Sig Start Date End Date Taking? Authorizing Provider  Ascorbic Acid (VITAMIN C) 1000 MG tablet Take 1,000 mg by mouth daily.    [provider]  aspirin-acetaminophen-caffeine (EXCEDRIN MIGRAINE) [id number] MG per tablet Take 1 tablet by mouth every 6 (six) hours as needed for headache.    [provider]  ciprofloxacin (CIPRO) 500 MG tablet Take 1 tablet (500 mg total) by mouth 2 (two) times daily. 03/24/18   Irean HongSung, Jade J, MD  clotrimazole (LOTRIMIN) 1 % cream Apply to affected area 2 times daily 11/04/13   Elwin MochaWalden, Blair, MD  Multiple Vitamins-Minerals (MULTIVITAMIN WITH MINERALS) tablet Take 1 tablet by mouth daily.    [provider]  tamsulosin (FLOMAX) 0.4 MG CAPS capsule Take 1 capsule (0.4 mg total) by mouth daily. 07/27/17   Hildred LaserBudzyn, Brian James, MD    Allergies Patient has no known  allergies.  Family History  Problem Relation Age of Onset  . Prostate cancer Neg Hx   . Bladder Cancer Neg Hx   . Kidney cancer Neg Hx     Social History Social History   Tobacco Use  . Smoking status: Current Every Day Smoker    Types: Cigarettes  . Smokeless tobacco: Never Used  Substance Use Topics  . Alcohol use: Yes    Comment: occ  . Drug use: No    Review of Systems  Constitutional: No fever/chills Eyes: No visual changes. ENT: No sore throat. Cardiovascular: Denies chest pain. Respiratory: Denies shortness of breath. Gastrointestinal: No abdominal pain.  No nausea, no vomiting.  No diarrhea.  No constipation. Genitourinary: Positive for urinary retention. Negative for dysuria. Musculoskeletal: Negative for back pain. Skin: Negative for rash. Neurological: Negative for headaches, focal weakness or numbness.   ____________________________________________   PHYSICAL EXAM:  VITAL SIGNS: ED Triage Vitals  Enc Vitals Group     BP 03/23/18 2225 135/77     Pulse Rate 03/23/18 2225 94     Resp 03/23/18 2225 18     Temp 03/23/18 2225 98.1 F (36.7 C)     Temp Source 03/23/18 2225 Oral     SpO2 03/23/18 2225 100 %     Weight 03/23/18 2229 220 lb (99.8 kg)     Height 03/23/18 2229 6\' 5"  (1.956 m)     Head Circumference --      Peak Flow --      Pain  Score --      Pain Loc --      Pain Edu? --      Excl. in GC? --    Examined after Foley insertion: Constitutional: Alert and oriented. Well appearing and in no acute distress. Eyes: Conjunctivae are normal. PERRL. EOMI. Head: Atraumatic. Nose: No congestion/rhinnorhea. Mouth/Throat: Mucous membranes are moist.  Oropharynx non-erythematous. Neck: No stridor.   Cardiovascular: Normal rate, regular rhythm. Grossly normal heart sounds.  Good peripheral circulation. Respiratory: Normal respiratory effort.  No retractions. Lungs CTAB. Gastrointestinal: Soft and nontender to light or deep palpation. No  distention. No abdominal bruits. No CVA tenderness. Genitourinary: Foley catheter in place with return of amber urine. Musculoskeletal: No lower extremity tenderness nor edema.  No joint effusions. Neurologic:  Normal speech and language. No gross focal neurologic deficits are appreciated. No gait instability. Skin:  Skin is warm, dry and intact. No rash noted. Psychiatric: Mood and affect are normal. Speech and behavior are normal.  ____________________________________________   LABS (all labs ordered are listed, but only abnormal results are displayed)  Labs Reviewed  CBC WITH DIFFERENTIAL/PLATELET - Abnormal; Notable for the following components:      Result Value   WBC 12.4 (*)    Neutro Abs 9.7 (*)    All other components within normal limits  BASIC METABOLIC PANEL - Abnormal; Notable for the following components:   Glucose, Bld 158 (*)    All other components within normal limits  URINALYSIS, COMPLETE (UACMP) WITH MICROSCOPIC - Abnormal; Notable for the following components:   Color, Urine YELLOW (*)    APPearance CLEAR (*)    Hgb urine dipstick MODERATE (*)    Leukocytes, UA TRACE (*)    All other components within normal limits  URINE CULTURE  TROPONIN I   ____________________________________________  EKG  ED ECG REPORT I, SUNG,JADE J, the attending physician, personally viewed and interpreted this ECG.   Date: 03/24/2018  EKG Time: 0042  Rate: 68  Rhythm: normal EKG, normal sinus rhythm  Axis: RAD  Intervals:none  ST&T Change: Nonspecific  ____________________________________________  RADIOLOGY  ED MD interpretation:  None  Official radiology report(s): No results found.  ____________________________________________   PROCEDURES  Procedure(s) performed: None  Procedures  Critical Care performed: No  ____________________________________________   INITIAL IMPRESSION / ASSESSMENT AND PLAN / ED COURSE  As part of my medical decision making, I  reviewed the following data within the electronic MEDICAL RECORD NUMBER History obtained from family, Nursing notes reviewed and incorporated, Labs reviewed, EKG interpreted, Old chart reviewed and Notes from prior ED visits   62 year old male with a history of BPH on Flomax who presents with urinary retention. Differential diagnosis includes, but is not limited to, acute appendicitis, renal colic, testicular torsion, urinary tract infection/pyelonephritis, prostatitis,  epididymitis, diverticulitis, small bowel obstruction or ileus, colitis, abdominal aortic aneurysm, gastroenteritis, hernia, etc.  Bladder scan revealed 771 mL post void.  Foley catheter was inserted with immediate relief of patient's symptoms.  Will check electrolytes and urinalysis.   Clinical Course as of Mar 25 235  Wynelle LinkSun Mar 24, 2018  0119 Patient complained of chest pain; thus EKG and troponin were added.  Chest is reproducible to palpation.  Patient has no history of CAD.  Toradol given with relief of symptoms.   [JS]  0235 Updated patient and spouse of all test results.  Patient had immediate relief of chest pain upon changing his position and chest pain was gone before Toradol was administered.  Very  low suspicion for ACS.  Do not think patient requires repeat troponin.  Will provide cardiology information for follow-up.  Instructed patient to wear Foley catheter for 7 to 10 days and to follow-up with his urologist.  He will continue Flomax daily.  Will discharge home with Cipro and nurse will instruct patient on Foley catheter care.  Strict return precautions given.  Both verbalize understanding and agree with plan of care.   [JS]    Clinical Course User Index [JS] Irean Hong, MD     ____________________________________________   FINAL CLINICAL IMPRESSION(S) / ED DIAGNOSES  Final diagnoses:  Urinary retention  Lower urinary tract infectious disease  Chest pain, unspecified type     ED Discharge Orders          Ordered    ciprofloxacin (CIPRO) 500 MG tablet  2 times daily     03/24/18 0157           Note:  This document was prepared using Dragon voice recognition software and may include unintentional dictation errors.    Irean Hong, MD 03/24/18 [phone]

## 2018-03-24 LAB — CBC WITH DIFFERENTIAL/PLATELET
Abs Immature Granulocytes: 0.05 10*3/uL (ref 0.00–0.07)
BASOS ABS: 0.1 10*3/uL (ref 0.0–0.1)
BASOS PCT: 1 %
EOS ABS: 0.1 10*3/uL (ref 0.0–0.5)
Eosinophils Relative: 1 %
HEMATOCRIT: 46 % (ref 39.0–52.0)
Hemoglobin: 15.2 g/dL (ref 13.0–17.0)
IMMATURE GRANULOCYTES: 0 %
LYMPHS ABS: 1.9 10*3/uL (ref 0.7–4.0)
Lymphocytes Relative: 15 %
MCH: 30.3 pg (ref 26.0–34.0)
MCHC: 33 g/dL (ref 30.0–36.0)
MCV: 91.8 fL (ref 80.0–100.0)
Monocytes Absolute: 0.6 10*3/uL (ref 0.1–1.0)
Monocytes Relative: 5 %
NEUTROS PCT: 78 %
Neutro Abs: 9.7 10*3/uL — ABNORMAL HIGH (ref 1.7–7.7)
PLATELETS: 258 10*3/uL (ref 150–400)
RBC: 5.01 MIL/uL (ref 4.22–5.81)
RDW: 13.1 % (ref 11.5–15.5)
WBC: 12.4 10*3/uL — AB (ref 4.0–10.5)
nRBC: 0 % (ref 0.0–0.2)

## 2018-03-24 LAB — BASIC METABOLIC PANEL
Anion gap: 8 (ref 5–15)
BUN: 16 mg/dL (ref 8–23)
CO2: 23 mmol/L (ref 22–32)
CREATININE: 0.85 mg/dL (ref 0.61–1.24)
Calcium: 8.9 mg/dL (ref 8.9–10.3)
Chloride: 105 mmol/L (ref 98–111)
GFR calc non Af Amer: >60 mL/min (ref >60)
Glucose, Bld: 158 mg/dL — ABNORMAL HIGH (ref 70–99)
Potassium: 3.9 mmol/L (ref 3.5–5.1)
SODIUM: 136 mmol/L (ref 135–145)

## 2018-03-24 LAB — URINALYSIS, COMPLETE (UACMP) WITH MICROSCOPIC
Bacteria, UA: NONE SEEN
Bilirubin Urine: NEGATIVE
Glucose, UA: NEGATIVE mg/dL
Ketones, ur: NEGATIVE mg/dL
Nitrite: NEGATIVE
Protein, ur: NEGATIVE mg/dL
SPECIFIC GRAVITY, URINE: 1.009 (ref 1.005–1.030)
pH: 6 (ref 5.0–8.0)

## 2018-03-24 LAB — TROPONIN I: Troponin I: <0.03 ng/mL (ref <0.03)

## 2018-03-24 MED ORDER — KETOROLAC TROMETHAMINE 30 MG/ML IJ SOLN
INTRAMUSCULAR | Status: AC
  Administered 2018-03-24: 15 mg via INTRAVENOUS
  Filled 2018-03-24: qty 1

## 2018-03-24 MED ORDER — KETOROLAC TROMETHAMINE 30 MG/ML IJ SOLN
15.0000 mg | Freq: Once | INTRAMUSCULAR | Status: AC
  Administered 2018-03-24: 15 mg via INTRAVENOUS

## 2018-03-24 MED ORDER — CIPROFLOXACIN HCL 500 MG PO TABS
500.0000 mg | ORAL_TABLET | Freq: Once | ORAL | Status: AC
  Administered 2018-03-24: 500 mg via ORAL
  Filled 2018-03-24: qty 1

## 2018-03-24 MED ORDER — CIPROFLOXACIN HCL 500 MG PO TABS: 500.0000 mg | ORAL_TABLET | Freq: Two times a day (BID) | ORAL | 0 refills | Status: DC

## 2018-03-24 NOTE — ED Notes (Signed)
Peripheral IV discontinued. Catheter intact. No signs of infiltration or redness. Gauze applied to IV site.   Discharge instructions reviewed with patient. Questions fielded by this RN. Patient verbalizes understanding of instructions. Patient discharged home in stable condition per sung. No acute distress noted at time of discharge.    

## 2018-03-24 NOTE — ED Notes (Signed)
Pt reports left chest pain 6/10 Dr Dolores FrameSung informed

## 2018-03-24 NOTE — Discharge Instructions (Addendum)
Keep Foley catheter in place until seen by your urologist in 7-10 days. Continue Flomax daily. Take antibiotic as prescribed (Cipro 500mg  twice daily x 7 days). Return to the ER for worsening symptoms, persistent vomiting, fever, difficulty breathing or other concerns.

## 2018-03-25 LAB — URINE CULTURE: CULTURE: NO GROWTH

## 2018-03-28 ENCOUNTER — Encounter: Payer: Self-pay | Admitting: Urology

## 2018-03-28 ENCOUNTER — Ambulatory Visit (INDEPENDENT_AMBULATORY_CARE_PROVIDER_SITE_OTHER): Payer: Self-pay | Admitting: Urology

## 2018-03-28 VITALS — BP 141/71 | HR 76 | Ht 77.0 in | Wt 212.0 lb

## 2018-03-28 DIAGNOSIS — N401 Enlarged prostate with lower urinary tract symptoms: Secondary | ICD-10-CM

## 2018-03-28 DIAGNOSIS — N138 Other obstructive and reflux uropathy: Secondary | ICD-10-CM

## 2018-03-28 DIAGNOSIS — R339 Retention of urine, unspecified: Secondary | ICD-10-CM

## 2018-03-28 DIAGNOSIS — R3129 Other microscopic hematuria: Secondary | ICD-10-CM

## 2018-03-28 DIAGNOSIS — R39198 Other difficulties with micturition: Secondary | ICD-10-CM

## 2018-03-28 NOTE — Progress Notes (Signed)
Fill and Pull Catheter Removal  Patient is present today for a catheter removal.  Patient was cleaned and prepped in a sterile fashion 240ml of sterile water/ saline was instilled into the bladder when the patient felt the urge to urinate. 8ml of water was then drained from the balloon.  A 14FR foley cath was removed from the bladder no complications were noted .  Patient as then given some time to void on their own.  Patient can void  220ml on their own after some time.  Patient tolerated well.  Preformed by: Eligha BridegroomSarah Kinnick Maus, CMA  Follow up/ Additional notes: cysto  Continuous Intermittent Catheterization  Due to urinary retention due to BPH patient is present today for a teaching of self I & O Catheterization. Patient was given detailed verbal and printed instructions of self catheterization. Patient was cleaned and prepped in a sterile fashion.  With instruction and assistance patient inserted a 16FR flex coude and urine return was noted 20 ml, urine was yellow in color. Patient tolerated well, no complications were noted Patient was given a sample bag with supplies to take home.  Patient is only to use if unable to urinate. He passed voiding trial today.   Preformed by: Eligha BridegroomSarah Lewanda Perea, CMA  Additional Notes: if patient is not able to urinate later today or this week he is to use flex coude and contact the office for more samples and a script will be [facility] to [facility]

## 2018-03-28 NOTE — Patient Instructions (Addendum)
Transurethral Resection of the Prostate    Transurethral resection of the prostate (TURP) is the removal (resection) of part of the gland that produces semen (prostate gland). This procedure is done to treat benign prostatic hyperplasia (BPH). BPH is an abnormal, noncancerous (benign) increase in the number of cells that make up the prostate tissue. BPH causes the prostate to get bigger. The enlarged prostate can push against or block the tube that drains urine from the bladder out of the body (urethra). BPH can affect normal urine flow by causing bladder infections, difficulty controlling bladder function, and difficulty emptying the bladder. The goal of TURP is to remove enough prostate tissue to allow for a normal flow of urine.  In a transurethral resection, a thin telescope with a light, a tiny camera, and an electric cutting edge (resectoscope) is passed through the urethra and into the prostate. The opening of the urethra is at the end of the penis.  Tell a health care provider about:   Any allergies you have.   All medicines you are taking, including vitamins, herbs, eye drops, creams, and over-the-counter medicines.   Any problems you or family members have had with anesthetic medicines.   Any blood disorders you have.   Any surgeries you have had.   Any medical conditions you have.   Any prostate infections you have had.  What are the risks?  Generally, this is a safe procedure. However, problems may occur, including:   Infection.   Bleeding.   Allergic reactions to medicines.   Damage to other structures or organs, such as:  ? The urethra.  ? The bladder.  ? Muscles that surround the prostate.   Difficulty getting an erection.   Difficulty controlling urination (incontinence).   Scarring, which may cause problems with urine flow.  What happens before the procedure?   Follow instructions from your health care provider about eating or drinking restrictions.   Ask your health care provider  about:  ? Changing or stopping your regular medicines. This is especially important if you are taking diabetes medicines or blood thinners.  ? Taking medicines such as aspirin and ibuprofen. These medicines can thin your blood. Do not take these medicines before your procedure if your health care provider instructs you not to.   You may have a physical exam.   You may have a blood or urine sample taken.   You may be given antibiotic medicine to help prevent infection.   Ask your health care provider how your surgical site will be marked or identified.   Plan to have someone take you home after the procedure. You may not be able to drive for up to 10 days after your procedure.  What happens during the procedure?   To reduce your risk of infection:  ? Your health care team will wash or sanitize their hands.  ? Your skin will be washed with soap.   An IV tube will be inserted into one of your veins.   You will be given one or more of the following:  ? A medicine to help you relax (sedative).  ? A medicine to make you fall asleep (general anesthetic).  ? A medicine that is injected into your spine to numb the area below and slightly above the injection site (spinal anesthetic).   Your legs will be placed in foot rests (stirrups) so that your legs are apart and your knees are bent.   The resectoscope will be passed through your urethra   to your prostate.   Parts of your prostate will be resected using the cutting edge of the resectoscope.   The resectoscope will be removed.   A thin, flexible tube (catheter) will be passed through your urethra and into your bladder. The catheter will drain urine into a bag outside of your body.  ? Fluid may be passed through the catheter to keep the catheter open.  The procedure may vary among health care providers and hospitals.  What happens after the procedure?   Your blood pressure, heart rate, breathing rate, and blood oxygen level will be monitored often until the  medicines you were given have worn off.   You may continue to receive fluids and medicines through an IV tube.   You may have some pain. Pain medicine will be available to help you.   You will have a catheter draining your urine.  ? You may have blood in your urine. Your catheter may be kept in until your urine is clear.  ? Your urinary drainage will be monitored. If necessary, your bladder may be rinsed out (irrigated) through your catheter.   You will be encouraged to walk around as soon as possible.   You may have to wear compression stockings. These stockings help prevent blood clots and reduce swelling in your legs.   Do not drive for 24 hours if you received a sedative.  This information is not intended to replace advice given to you by your health care provider. Make sure you discuss any questions you have with your health care provider.  Document Released: 03/13/2005 Document Revised: 11/14/2015 Document Reviewed: 12/03/2014  Elsevier Interactive Patient Education  2019 [employer]

## 2018-03-28 NOTE — Progress Notes (Signed)
   03/28/2018 4:03 PM   Ronnell Freshwater 30-May-1955 [id number]  Reason for visit: Follow up urinary retention  HPI: I saw Mr. Kozub in urology clinic today for the first time for urinary retention.  He was previously followed by Dr. Sherryl Barters.  He is a 63 year old healthy male who was originally seen in May 2019 with urinary symptoms and weak stream as well as microscopic hematuria.  He was started on Flomax at that time.  He did not follow-up for CT urogram and cystoscopy as recommended.  He reports he developed acute onset of urinary retention on 03/24/2018 after having 4-5 alcoholic drinks.  A Foley catheter was placed in the emergency department with return of clear yellow urine.  Urine culture was negative, but there was persistent microscopic hematuria with 6-10 RBCs.  There are no aggravating or alleviating factors.  Severity is moderate.  His DRE in May was reportedly 40 g with no abnormalities.  PSA was 2.4.   ROS: Please see flowsheet from today's date for complete review of systems.  Physical Exam: BP (!) 141/71 (BP Location: Left Arm, Patient Position: Sitting)   Pulse 76   Ht 6\' 5"  (1.956 m)   Wt 212 lb (96.2 kg)   BMI 25.14 kg/m    Constitutional:  Alert and oriented, No acute distress. Respiratory: Normal respiratory effort, no increased work of breathing. GI: Abdomen is soft, nontender, nondistended, no abdominal masses GU: No CVA tenderness, phallus without lesions, widely patent meatus, urine clear yellow per Foley Skin: No rashes, bruises or suspicious lesions. Neurologic: Grossly intact, no focal deficits, moving all 4 extremities. Psychiatric: Normal mood and affect  Laboratory Data: PSA 2.4 Urine culture negative, 6-10 RBCs  Pertinent Imaging: None to review  Assessment & Plan:   In summary, Mr. Nong is a healthy 63 year old male with history of urinary symptoms, including weak stream and urinary frequency, urinary retention 03/24/2018 managed with  Foley catheter, and history of microscopic hematuria that has not yet undergone work-up.  He passed a void trial today in clinic.  I suspect his acute onset of urinary retention was secondary to heavy alcohol use on top of his baseline BPH.  We discussed common possible etiologies of hematuria including BPH, malignancy, urolithiasis, medical renal disease, and idiopathic. Standard workup recommended by the AUA includes imaging with CT urogram to assess the upper tracts, and cystoscopy. Cytology is performed on patient's with gross hematuria to look for malignant cells in the urine.  We briefly discussed transurethral resection of the prostate and other outlet procedures including HOLEP.  He would like to proceed with TURP if hematuria work-up is negative.  We also discussed the importance of prostate size and choosing a surgery for bladder outlet obstruction, which the CT will provide [place].  Follow-up for CT urogram and cystoscopy, discuss outlet procedures further based on prostate size  Sondra Come, MD  [facility] [address], [address] [place], [place] [address] [phone]

## 2018-04-05 ENCOUNTER — Ambulatory Visit: Payer: Self-pay | Admitting: Family Medicine

## 2018-04-05 ENCOUNTER — Ambulatory Visit
Admission: RE | Admit: 2018-04-05 | Discharge: 2018-04-05 | Disposition: A | Discharge status: Home / Self Care | Payer: Self-pay | Source: Ambulatory Visit | Attending: Urology | Admitting: Urology

## 2018-04-05 DIAGNOSIS — R339 Retention of urine, unspecified: Secondary | ICD-10-CM

## 2018-04-05 DIAGNOSIS — R3129 Other microscopic hematuria: Secondary | ICD-10-CM

## 2018-04-05 LAB — MICROSCOPIC EXAMINATION
BACTERIA UA: NONE SEEN
EPITHELIAL CELLS (NON RENAL): NONE SEEN /HPF (ref 0–10)
RBC, UA: >30 /hpf — ABNORMAL HIGH (ref 0–2)
WBC, UA: NONE SEEN /hpf (ref 0–5)

## 2018-04-05 LAB — URINALYSIS, COMPLETE
Bilirubin, UA: NEGATIVE
Glucose, UA: NEGATIVE
Ketones, UA: NEGATIVE
Leukocytes, UA: NEGATIVE
NITRITE UA: NEGATIVE
PH UA: 7 (ref 5.0–7.5)
Protein, UA: NEGATIVE
Specific Gravity, UA: 1.015 (ref 1.005–1.030)
UUROB: 0.2 mg/dL (ref 0.2–1.0)

## 2018-04-05 LAB — BLADDER SCAN AMB NON-IMAGING: Scan Result: 770

## 2018-04-05 MED ORDER — CIPROFLOXACIN HCL 500 MG PO TABS: 500.0000 mg | ORAL_TABLET | Freq: Two times a day (BID) | ORAL | 0 refills | Status: DC

## 2018-04-05 MED ORDER — IOPAMIDOL (ISOVUE-370) INJECTION 76%
125.0000 mL | Freq: Once | INTRAVENOUS | Status: AC | PRN
  Administered 2018-04-05: 125 mL via INTRAVENOUS

## 2018-04-05 MED ORDER — IOPAMIDOL (ISOVUE-300) INJECTION 61%: 125.0000 mL | Freq: Once | INTRAVENOUS | Status: DC | PRN

## 2018-04-05 NOTE — Progress Notes (Signed)
Cath placement  Patient is present today for a catheter change due to urinary retention. A 16 FR foley cath was placed into the bladder no complications were noted Urine return was noted and urine was dark yellow in color. The balloon was filled with 84ml of sterile water. A leg bag was attached for drainage.  A night bag was also given to the patient and patient was given instruction on how to change from one bag to another. Patient was given proper instruction on catheter care.    Preformed by: Teressa Lower, CMA

## 2018-04-08 ENCOUNTER — Other Ambulatory Visit: Payer: Self-pay | Admitting: Radiology

## 2018-04-08 ENCOUNTER — Telehealth: Payer: Self-pay | Admitting: Radiology

## 2018-04-08 ENCOUNTER — Ambulatory Visit (INDEPENDENT_AMBULATORY_CARE_PROVIDER_SITE_OTHER): Payer: Self-pay | Admitting: Urology

## 2018-04-08 ENCOUNTER — Encounter: Payer: Self-pay | Admitting: Urology

## 2018-04-08 VITALS — BP 135/72 | HR 72 | Ht 77.0 in | Wt 212.0 lb

## 2018-04-08 DIAGNOSIS — R339 Retention of urine, unspecified: Secondary | ICD-10-CM

## 2018-04-08 DIAGNOSIS — R3129 Other microscopic hematuria: Secondary | ICD-10-CM

## 2018-04-08 DIAGNOSIS — N138 Other obstructive and reflux uropathy: Secondary | ICD-10-CM

## 2018-04-08 DIAGNOSIS — N401 Enlarged prostate with lower urinary tract symptoms: Secondary | ICD-10-CM

## 2018-04-08 NOTE — Progress Notes (Signed)
Cystoscopy Procedure Note:  Indication: Microscopic hematuria, recurrent urinary retention  After informed consent and discussion of the procedure and its risks, Tresten Elwood was positioned and prepped in the standard fashion. Cystoscopy was performed with a flexible cystoscope. The urethra, bladder neck and entire bladder was visualized in a standard fashion. The prostate was large with coapting lateral lobes and high bladder neck. Mild irritation at posterior bladder wall from indwelling foley catheter, no tumors or lesions. Retroflexion demonstrated significant intravesical protrusion of the prostate.  Imaging: CT urogram 04/05/2018 personally reviewed. Prostate measures 90cc, no urolithiasis or hydronephrosis, no renal masses or filling defects.  Findings: Enlarged prostate, no suspicious bladder lesions  Assessment and Plan: In summary, Mr. Merrithew is a healthy 63 year old male with recurrent Foley dependent urinary retention and 90 cc prostate on CT.  His microscopic hematuria work-up is negative, aside from an enlarged prostate.  PSA is within normal limits at 2.4.  We discussed the risks and benefits of HOLEP at length.  The procedure requires general anesthesia and takes 2 to 3 hours, and a holmium laser is used to enucleate the prostate and push this tissue into the bladder.  A morcellator is then used to remove this tissue, which is sent for pathology.  Majority of patients are able to discharge the same day with a catheter in place for 2 to 3 days, and will follow-up in clinic for a voiding trial.  Approximately 10% of patients will be admitted overnight to monitor the urine, or if they have multiple comorbidities.  We specifically discussed the risks of bleeding, infection, retrograde ejaculation, temporary urgency and urge incontinence, very low risk of long-term incontinence, and possible need for additional procedures.  Schedule HOLEP, plan to [place]  Legrand Rams, MD 04/08/2018

## 2018-04-08 NOTE — Telephone Encounter (Signed)
Patient was given the [facility] Surgery Information form below as well as the Instructions for Pre-Admission Testing form & a map of [facility].   [address], [address] [place], [place] [address] Telephone: [phone] Fax: [phone]   Thank you for choosing [facility] for your upcoming surgery!  We are always here to assist in your urological needs.  Please read the following information with specific details for your upcoming appointments related to your surgery. Please contact Tayjah Lobdell at [phone] Option 3 with any questions.  The Name of Your Surgery: Holmium laser enucleation of the prostate Your Surgery Date: 04/19/2018 Your Surgeon: Legrand Rams  Please call Same Day Surgery at [phone] between the hours of 1pm-3pm one day prior to your surgery. They will inform you of the time to arrive at Same Day Surgery which is located on the second floor of the [facility].   Please refer to the attached letter regarding instructions for Pre-Admission Testing. You will receive a call from the Pre-Admission Testing office regarding your appointment with them.  The Pre-Admission Testing office is located at [address] [facility], on the first floor of the [facility] in [address] (office is to the right as you enter through the [employer] of the [employer]). Please have all medications you are currently taking and your insurance card available.   Patient was advised to have nothing to eat or drink after midnight the night prior to surgery except that he may have only water until 2 hours before surgery with nothing to drink within 2 hours of surgery.  The patient states he currently takes no blood thinners. Patient's questions were answered and he expressed understanding of these instructions.

## 2018-04-08 NOTE — Progress Notes (Signed)
Catheter Removal  Patient is present today for a catheter removal.  53ml of water was drained from the balloon. A 16FR foley cath was removed from the bladder no complications were noted . Patient tolerated well.  Preformed by: sarah watts, CMA  Bladder Scan Patient can void: 0 ml Performed By: Eligha Bridegroom, CMA  CIC instructions were reviewed again with patient and wife and samples of 45fr flex coude to use as needed. Will call if more samples are needed prior to surgery

## 2018-04-12 ENCOUNTER — Other Ambulatory Visit: Payer: Self-pay

## 2018-04-12 DIAGNOSIS — N138 Other obstructive and reflux uropathy: Secondary | ICD-10-CM

## 2018-04-12 DIAGNOSIS — R339 Retention of urine, unspecified: Secondary | ICD-10-CM

## 2018-04-12 DIAGNOSIS — N401 Enlarged prostate with lower urinary tract symptoms: Secondary | ICD-10-CM

## 2018-04-12 LAB — MICROSCOPIC EXAMINATION
BACTERIA UA: NONE SEEN
Epithelial Cells (non renal): NONE SEEN /hpf (ref 0–10)
WBC, UA: NONE SEEN /hpf (ref 0–5)

## 2018-04-12 LAB — URINALYSIS, COMPLETE
Bilirubin, UA: NEGATIVE
Glucose, UA: NEGATIVE
Ketones, UA: NEGATIVE
Leukocytes, UA: NEGATIVE
Nitrite, UA: NEGATIVE
PH UA: 7 (ref 5.0–7.5)
PROTEIN UA: NEGATIVE
Specific Gravity, UA: 1.015 (ref 1.005–1.030)
Urobilinogen, Ur: 0.2 mg/dL (ref 0.2–1.0)

## 2018-04-14 LAB — CULTURE, URINE COMPREHENSIVE

## 2018-04-16 ENCOUNTER — Other Ambulatory Visit: Payer: Self-pay | Admitting: Urology

## 2018-04-16 ENCOUNTER — Other Ambulatory Visit: Payer: Self-pay

## 2018-04-16 ENCOUNTER — Encounter
Admission: RE | Admit: 2018-04-16 | Discharge: 2018-04-16 | Disposition: A | Discharge status: Home / Self Care | Payer: Self-pay | Source: Ambulatory Visit | Attending: Urology | Admitting: Urology

## 2018-04-16 HISTORY — DX: Headache: R51

## 2018-04-16 HISTORY — DX: Headache, unspecified: R51.9

## 2018-04-16 HISTORY — DX: Personal history of other diseases of the digestive system: Z87.19

## 2018-04-16 HISTORY — DX: Gastro-esophageal reflux disease without esophagitis: K21.9

## 2018-04-16 NOTE — Patient Instructions (Signed)
Your procedure is scheduled on: 04-19-18 Report to Same Day Surgery 2nd floor medical mall [facility] Entrance-take elevator on left to 2nd floor.  Check in with surgery information desk.) To find out your arrival time please call [phone] between 1PM - 3PM on 04-18-18  Remember: Instructions that are not followed completely may result in serious medical risk, up to and including death, or upon the discretion of your surgeon and anesthesiologist your surgery may need to be rescheduled.    _x___ 1. Do not eat food after midnight the night before your procedure. You may drink clear liquids up to 2 hours before you are scheduled to arrive at the hospital for your procedure.  Do not drink clear liquids within 2 hours of your scheduled arrival to the hospital.  Clear liquids include  --Water or Apple juice without pulp  --Clear carbohydrate beverage such as ClearFast or Gatorade  --Black Coffee or Clear Tea (No milk, no creamers, do not add anything to the coffee or Tea   ____Ensure clear carbohydrate drink on the way to the hospital for bariatric patients  ____Ensure clear carbohydrate drink 3 hours before surgery for Dr Rutherford Nail patients if physician instructed.   No gum chewing or hard candies.     __x__ 2. No Alcohol for 24 hours before or after surgery.   __x__3. No Smoking or e-cigarettes for 24 prior to surgery.  Do not use any chewable tobacco products for at least 6 hour prior to surgery   ____  4. Bring all medications with you on the day of surgery if instructed.    __x__ 5. Notify your doctor if there is any change in your medical condition     (cold, fever, infections).    x___6. On the morning of surgery brush your teeth with toothpaste and water.  You may rinse your mouth with mouth wash if you wish.  Do not swallow any toothpaste or mouthwash.   Do not wear jewelry, make-up, hairpins, clips or nail polish.  Do not wear lotions, powders, or perfumes. You may wear  deodorant.  Do not shave 48 hours prior to surgery. Men may shave face and neck.  Do not bring valuables to the hospital.    [facility] is not responsible for any belongings or valuables.               Contacts, dentures or bridgework may not be worn into surgery.  Leave your suitcase in the car. After surgery it may be brought to your room.  For patients admitted to the hospital, discharge time is determined by your treatment team.  _  Patients discharged the day of surgery will not be allowed to drive home.  You will need someone to drive you home and stay with you the night of your procedure.    Please read over the following fact sheets that you were given:   [facility] Preparing for Surgery   _x___ TAKE THE FOLLOWING MEDICATION THE MORNING OF SURGERY WITH A SMALL SIP OF WATER. These include:  1. FLOMAX  2.  3.  4.  5.  6.  ____Fleets enema or Magnesium Citrate as directed.   ____ Use CHG Soap or sage wipes as directed on instruction sheet   ____ Use inhalers on the day of surgery and bring to hospital day of surgery  ____ Stop Metformin and Janumet 2 days prior to surgery.    ____ Take 1/2 of usual insulin dose the night before surgery  and none on the morning surgery.   ____ Follow recommendations from Cardiologist, Pulmonologist or PCP regarding stopping Aspirin, Coumadin, Plavix ,Eliquis, Effient, or Pradaxa, and Pletal.  X____Stop Anti-inflammatories such as Advil, Aleve, Ibuprofen, Motrin, Naproxen, Naprosyn, Goodies powders or aspirin products NOW-OK to take Tylenol    ____ Stop supplements until after surgery.     ____ Bring C-Pap to the hospital.

## 2018-04-18 MED ORDER — CIPROFLOXACIN IN D5W 400 MG/200ML IV SOLN
400.0000 mg | INTRAVENOUS | Status: AC
  Administered 2018-04-19: 400 mg via INTRAVENOUS

## 2018-04-19 ENCOUNTER — Other Ambulatory Visit: Payer: Self-pay

## 2018-04-19 ENCOUNTER — Ambulatory Visit: Payer: Self-pay | Admitting: Anesthesiology

## 2018-04-19 ENCOUNTER — Encounter
Admission: RE | Disposition: A | Discharge status: Home / Self Care | Payer: Self-pay | Source: Home / Self Care | Attending: Urology

## 2018-04-19 ENCOUNTER — Observation Stay
Admission: RE | Admit: 2018-04-19 | Discharge: 2018-04-20 | Disposition: A | Discharge status: Home / Self Care | Payer: Self-pay | Attending: Urology | Admitting: Urology

## 2018-04-19 DIAGNOSIS — N401 Enlarged prostate with lower urinary tract symptoms: Secondary | ICD-10-CM

## 2018-04-19 DIAGNOSIS — N138 Other obstructive and reflux uropathy: Secondary | ICD-10-CM | POA: Diagnosis present

## 2018-04-19 DIAGNOSIS — Z23 Encounter for immunization: Secondary | ICD-10-CM | POA: Insufficient documentation

## 2018-04-19 DIAGNOSIS — R338 Other retention of urine: Secondary | ICD-10-CM

## 2018-04-19 DIAGNOSIS — R339 Retention of urine, unspecified: Secondary | ICD-10-CM

## 2018-04-19 HISTORY — PX: HOLEP-LASER ENUCLEATION OF THE PROSTATE WITH MORCELLATION: SHX6641

## 2018-04-19 SURGERY — ENUCLEATION, PROSTATE, USING LASER, WITH MORCELLATION
Anesthesia: General

## 2018-04-19 MED ORDER — SEVOFLURANE IN SOLN
RESPIRATORY_TRACT | Status: AC
  Filled 2018-04-19: qty 250

## 2018-04-19 MED ORDER — FENTANYL CITRATE (PF) 100 MCG/2ML IJ SOLN
INTRAMUSCULAR | Status: DC | PRN
  Administered 2018-04-19 (×2): 50 ug via INTRAVENOUS
  Administered 2018-04-19: 100 ug via INTRAVENOUS

## 2018-04-19 MED ORDER — DEXAMETHASONE SODIUM PHOSPHATE 10 MG/ML IJ SOLN
INTRAMUSCULAR | Status: DC | PRN
  Administered 2018-04-19: 10 mg via INTRAVENOUS

## 2018-04-19 MED ORDER — INFLUENZA VAC SPLIT QUAD 0.5 ML IM SUSY
0.5000 mL | PREFILLED_SYRINGE | INTRAMUSCULAR | Status: AC
  Administered 2018-04-20: 0.5 mL via INTRAMUSCULAR
  Filled 2018-04-19: qty 0.5

## 2018-04-19 MED ORDER — ACETAMINOPHEN 10 MG/ML IV SOLN
INTRAVENOUS | Status: AC
  Filled 2018-04-19: qty 100

## 2018-04-19 MED ORDER — HYDROMORPHONE HCL 1 MG/ML IJ SOLN
INTRAMUSCULAR | Status: AC
  Filled 2018-04-19: qty 1

## 2018-04-19 MED ORDER — SUGAMMADEX SODIUM 200 MG/2ML IV SOLN
INTRAVENOUS | Status: AC
  Filled 2018-04-19: qty 2

## 2018-04-19 MED ORDER — EPHEDRINE SULFATE 50 MG/ML IJ SOLN
INTRAMUSCULAR | Status: DC | PRN
  Administered 2018-04-19 (×5): 5 mg via INTRAVENOUS

## 2018-04-19 MED ORDER — ONDANSETRON HCL 4 MG/2ML IJ SOLN
INTRAMUSCULAR | Status: AC
  Filled 2018-04-19: qty 2

## 2018-04-19 MED ORDER — HYDROMORPHONE HCL 1 MG/ML IJ SOLN
0.5000 mg | INTRAMUSCULAR | Status: DC | PRN
  Administered 2018-04-19 (×2): 0.5 mg via INTRAVENOUS
  Filled 2018-04-19 (×2): qty 1

## 2018-04-19 MED ORDER — PHENYLEPHRINE HCL 10 MG/ML IJ SOLN
INTRAMUSCULAR | Status: DC | PRN
  Administered 2018-04-19 (×4): 100 ug via INTRAVENOUS
  Administered 2018-04-19: 200 ug via INTRAVENOUS
  Administered 2018-04-19 (×2): 100 ug via INTRAVENOUS
  Administered 2018-04-19: 200 ug via INTRAVENOUS
  Administered 2018-04-19 (×3): 100 ug via INTRAVENOUS
  Administered 2018-04-19: 200 ug via INTRAVENOUS

## 2018-04-19 MED ORDER — LACTATED RINGERS IV SOLN
INTRAVENOUS | Status: DC
  Administered 2018-04-19 (×2): via INTRAVENOUS

## 2018-04-19 MED ORDER — PROPOFOL 10 MG/ML IV BOLUS
INTRAVENOUS | Status: DC | PRN
  Administered 2018-04-19: 200 mg via INTRAVENOUS
  Administered 2018-04-19: 50 mg via INTRAVENOUS

## 2018-04-19 MED ORDER — ROCURONIUM BROMIDE 100 MG/10ML IV SOLN
INTRAVENOUS | Status: DC | PRN
  Administered 2018-04-19: 45 mg via INTRAVENOUS
  Administered 2018-04-19: 20 mg via INTRAVENOUS
  Administered 2018-04-19: 10 mg via INTRAVENOUS
  Administered 2018-04-19: 5 mg via INTRAVENOUS
  Administered 2018-04-19: 10 mg via INTRAVENOUS

## 2018-04-19 MED ORDER — SUGAMMADEX SODIUM 200 MG/2ML IV SOLN
INTRAVENOUS | Status: DC | PRN
  Administered 2018-04-19: 200 mg via INTRAVENOUS

## 2018-04-19 MED ORDER — FAMOTIDINE 20 MG PO TABS
20.0000 mg | ORAL_TABLET | Freq: Once | ORAL | Status: AC
  Administered 2018-04-19: 20 mg via ORAL

## 2018-04-19 MED ORDER — FAMOTIDINE 20 MG PO TABS
ORAL_TABLET | ORAL | Status: AC
  Administered 2018-04-19: 20 mg via ORAL
  Filled 2018-04-19: qty 1

## 2018-04-19 MED ORDER — BELLADONNA ALKALOIDS-OPIUM 16.2-60 MG RE SUPP
1.0000 | Freq: Four times a day (QID) | RECTAL | Status: DC | PRN
  Administered 2018-04-19: 1 via RECTAL
  Filled 2018-04-19: qty 1

## 2018-04-19 MED ORDER — SUCCINYLCHOLINE CHLORIDE 20 MG/ML IJ SOLN
INTRAMUSCULAR | Status: AC
  Filled 2018-04-19: qty 1

## 2018-04-19 MED ORDER — LIDOCAINE HCL (CARDIAC) PF 100 MG/5ML IV SOSY
PREFILLED_SYRINGE | INTRAVENOUS | Status: DC | PRN
  Administered 2018-04-19: 100 mg via INTRAVENOUS

## 2018-04-19 MED ORDER — SODIUM CHLORIDE 0.9 % IR SOLN
3000.0000 mL | Status: DC
  Administered 2018-04-19: 3000 mL

## 2018-04-19 MED ORDER — PHENYLEPHRINE HCL 10 MG/ML IJ SOLN
INTRAMUSCULAR | Status: AC
  Filled 2018-04-19: qty 1

## 2018-04-19 MED ORDER — HYDROMORPHONE HCL 1 MG/ML IJ SOLN
0.2500 mg | INTRAMUSCULAR | Status: DC | PRN
  Administered 2018-04-19 (×4): 0.25 mg via INTRAVENOUS

## 2018-04-19 MED ORDER — ONDANSETRON HCL 4 MG/2ML IJ SOLN
INTRAMUSCULAR | Status: DC | PRN
  Administered 2018-04-19: 4 mg via INTRAVENOUS

## 2018-04-19 MED ORDER — MIDAZOLAM HCL 2 MG/2ML IJ SOLN
INTRAMUSCULAR | Status: AC
  Filled 2018-04-19: qty 2

## 2018-04-19 MED ORDER — OXYCODONE-ACETAMINOPHEN 5-325 MG PO TABS
1.0000 | ORAL_TABLET | ORAL | Status: DC | PRN
  Administered 2018-04-19: 2 via ORAL
  Administered 2018-04-19: 1 via ORAL
  Filled 2018-04-19: qty 2
  Filled 2018-04-19: qty 1

## 2018-04-19 MED ORDER — LIDOCAINE HCL (PF) 2 % IJ SOLN
INTRAMUSCULAR | Status: AC
  Filled 2018-04-19: qty 10

## 2018-04-19 MED ORDER — ACETAMINOPHEN 325 MG PO TABS: 650.0000 mg | ORAL_TABLET | ORAL | Status: DC | PRN

## 2018-04-19 MED ORDER — PROPOFOL 10 MG/ML IV BOLUS
INTRAVENOUS | Status: AC
  Filled 2018-04-19: qty 40

## 2018-04-19 MED ORDER — FENTANYL CITRATE (PF) 250 MCG/5ML IJ SOLN
INTRAMUSCULAR | Status: AC
  Filled 2018-04-19: qty 5

## 2018-04-19 MED ORDER — ONDANSETRON HCL 4 MG/2ML IJ SOLN: 4.0000 mg | INTRAMUSCULAR | Status: DC | PRN

## 2018-04-19 MED ORDER — DEXAMETHASONE SODIUM PHOSPHATE 10 MG/ML IJ SOLN
INTRAMUSCULAR | Status: AC
  Filled 2018-04-19: qty 1

## 2018-04-19 MED ORDER — ACETAMINOPHEN 10 MG/ML IV SOLN
INTRAVENOUS | Status: DC | PRN
  Administered 2018-04-19: 1000 mg via INTRAVENOUS

## 2018-04-19 MED ORDER — MIDAZOLAM HCL 2 MG/2ML IJ SOLN
INTRAMUSCULAR | Status: DC | PRN
  Administered 2018-04-19: 2 mg via INTRAVENOUS

## 2018-04-19 MED ORDER — BELLADONNA ALKALOIDS-OPIUM 16.2-60 MG RE SUPP: 1.0000 | Freq: Four times a day (QID) | RECTAL | Status: DC | PRN

## 2018-04-19 MED ORDER — EPHEDRINE SULFATE 50 MG/ML IJ SOLN
INTRAMUSCULAR | Status: AC
  Filled 2018-04-19: qty 1

## 2018-04-19 MED ORDER — SUCCINYLCHOLINE CHLORIDE 20 MG/ML IJ SOLN
INTRAMUSCULAR | Status: DC | PRN
  Administered 2018-04-19: 120 mg via INTRAVENOUS

## 2018-04-19 MED ORDER — ROCURONIUM BROMIDE 50 MG/5ML IV SOLN
INTRAVENOUS | Status: AC
  Filled 2018-04-19: qty 1

## 2018-04-19 MED ORDER — CIPROFLOXACIN IN D5W 400 MG/200ML IV SOLN
INTRAVENOUS | Status: AC
  Filled 2018-04-19: qty 200

## 2018-04-19 SURGICAL SUPPLY — 36 items
ADAPTER IRRIG TUBE 2 SPIKE SOL (ADAPTER) ×6 IMPLANT
BAG URINE DRAINAGE (UROLOGICAL SUPPLIES) IMPLANT
BAG URO DRAIN 4000ML (MISCELLANEOUS) ×2 IMPLANT
CATH FOL 2WAY LX 20X30 (CATHETERS) IMPLANT
CATH FOL 2WAY LX 22X30 (CATHETERS) IMPLANT
CATH FOLEY 3WAY 30CC 22FR (CATHETERS) IMPLANT
CATH FOLEY 3WAY 30CC 24FR (CATHETERS) ×2
CATH URETL 5X70 OPEN END (CATHETERS) ×3 IMPLANT
CATH URTH STD 24FR FL 3W 2 (CATHETERS) IMPLANT
CONTAINER COLLECT MORCELLATR (MISCELLANEOUS) ×1 IMPLANT
DRAPE SHEET LG 3/4 BI-LAMINATE (DRAPES) ×3 IMPLANT
DRAPE UTILITY 15X26 TOWEL STRL (DRAPES) ×2 IMPLANT
FILTER OVERFLOW MORCELLATOR (FILTER) ×1 IMPLANT
GLOVE BIOGEL PI IND STRL 7.5 (GLOVE) ×1 IMPLANT
GLOVE BIOGEL PI INDICATOR 7.5 (GLOVE) ×6
GOWN STRL REUS W/ TWL LRG LVL3 (GOWN DISPOSABLE) ×1 IMPLANT
GOWN STRL REUS W/ TWL XL LVL3 (GOWN DISPOSABLE) ×1 IMPLANT
GOWN STRL REUS W/TWL LRG LVL3 (GOWN DISPOSABLE) ×4
GOWN STRL REUS W/TWL XL LVL3 (GOWN DISPOSABLE) ×2
HOLDER FOLEY CATH W/STRAP (MISCELLANEOUS) ×3 IMPLANT
KIT TURNOVER CYSTO (KITS) ×3 IMPLANT
LASER FIBER 550M SMARTSCOPE (Laser) ×3 IMPLANT
MORCELLATOR COLLECT CONTAINER (MISCELLANEOUS) ×3
MORCELLATOR OVERFLOW FILTER (FILTER) ×3
MORCELLATOR ROTATION 4.75 335 (MISCELLANEOUS) ×3 IMPLANT
PACK CYSTO AR (MISCELLANEOUS) ×3 IMPLANT
SENSORWIRE 0.038 NOT ANGLED (WIRE)
SET CYSTO W/LG BORE CLAMP LF (SET/KITS/TRAYS/PACK) ×3 IMPLANT
SET IRRIG Y TYPE TUR BLADDER L (SET/KITS/TRAYS/PACK) ×3 IMPLANT
SLEEVE PROTECTION STRL DISP (MISCELLANEOUS) ×6 IMPLANT
SOL .9 NS 3000ML IRR  AL (IV SOLUTION) ×22
SOL .9 NS 3000ML IRR UROMATIC (IV SOLUTION) ×4 IMPLANT
SYRINGE IRR TOOMEY STRL 70CC (SYRINGE) ×3 IMPLANT
TUBE PUMP MORCELLATOR PIRANHA (TUBING) ×3 IMPLANT
WATER STERILE IRR 1000ML POUR (IV SOLUTION) ×3 IMPLANT
WIRE SENSOR 0.038 NOT ANGLED (WIRE) IMPLANT

## 2018-04-19 NOTE — Anesthesia Preprocedure Evaluation (Addendum)
Anesthesia Evaluation  Patient identified by MRN, date of birth, ID band Patient awake    Reviewed: Allergy & Precautions, H&P , NPO status , Patient's Chart, lab work & pertinent test results  Airway Mallampati: III       Dental  (+) Teeth Intact   Pulmonary neg COPD, neg recent URI, Current Smoker,           Cardiovascular (-) angina(-) DOE negative cardio ROS       Neuro/Psych  Headaches, negative psych ROS   GI/Hepatic Neg liver ROS, hiatal hernia, GERD  Controlled,  Endo/Other  negative endocrine ROS  Renal/GU      Musculoskeletal   Abdominal   Peds  Hematology negative hematology ROS (+)   Anesthesia Other Findings Past Medical History: No date: Enlarged prostate No date: GERD (gastroesophageal reflux disease)     Comment:  OCC No date: Headache     Comment:  MIGRAINES No date: History of hiatal hernia  Past Surgical History: No date: NO PAST SURGERIES  BMI    Body Mass Index:  25.85 kg/m      Reproductive/Obstetrics negative OB ROS                            Anesthesia Physical Anesthesia Plan  ASA: II  Anesthesia Plan: General ETT   Post-op Pain Management:    Induction:   PONV Risk Score and Plan: Ondansetron, Dexamethasone, Midazolam and Treatment may vary due to age or medical condition  Airway Management Planned:   Additional Equipment:   Intra-op Plan:   Post-operative Plan:   Informed Consent: I have reviewed the patients History and Physical, chart, labs and discussed the procedure including the risks, benefits and alternatives for the proposed anesthesia with the patient or authorized representative who has indicated his/her understanding and acceptance.     Dental Advisory Given  Plan Discussed with: Anesthesiologist and CRNA  Anesthesia Plan Comments:         Anesthesia Quick Evaluation

## 2018-04-19 NOTE — Anesthesia Procedure Notes (Signed)
Procedure Name: Intubation Date/Time: 04/19/2018 7:46 AM Performed by: Oletta LamasBlackwell, Letica Giaimo Delores, CRNA Pre-anesthesia Checklist: Patient identified, Emergency Drugs available, Suction available, Patient being monitored and Timeout performed Patient Re-evaluated:Patient Re-evaluated prior to induction Oxygen Delivery Method: Circle system utilized Preoxygenation: Pre-oxygenation with 100% oxygen Induction Type: IV induction Ventilation: Mask ventilation without difficulty Laryngoscope Size: Glidescope Grade View: Grade I Tube type: Oral Tube size: 7.5 mm Number of attempts: 1 Airway Equipment and Method: Stylet Placement Confirmation: ETT inserted through vocal cords under direct vision,  positive ETCO2 and breath sounds checked- equal and bilateral Secured at: 23 cm Tube secured with: Tape Dental Injury: Teeth and Oropharynx as per pre-operative assessment

## 2018-04-19 NOTE — H&P (Signed)
UROLOGY H&P UPDATE  Agree with prior H&P dated 03/28/2018. 63 yo healthy M with 90g gland on CT, multiple episodes of urinary retention and LUTS.  Cardiac: RRR Lungs: CTA bilaterally  Laterality: N/A Procedure: HOLEP  Urine: Urine cx 1/17 no growth  We discussed the risks and benefits of HOLEP at length.  The procedure requires general anesthesia and takes 2 to 3 hours, and a holmium laser is used to enucleate the prostate and push this tissue into the bladder.  A morcellator is then used to remove this tissue, which is sent for pathology.  Majority of patients are able to discharge the same day with a catheter in place for 2 to 3 days, and will follow-up in clinic for a voiding trial.  Approximately 10% of patients will be admitted overnight to monitor the urine, or if they have multiple comorbidities.  We specifically discussed the risks of bleeding, infection, retrograde ejaculation, temporary urgency and urge incontinence, very low risk of long-term incontinence, and possible need for additional procedures.  Will plan to admit [place]   Sondra Come, MD 04/19/2018

## 2018-04-19 NOTE — Op Note (Signed)
Date of procedure: 04/19/18  Preoperative diagnosis:  1. BPH with urinary retention  Postoperative diagnosis:  1. Same  Procedure: 1. HoLEP  Surgeon: Legrand Rams, MD  Anesthesia: General  Complications: None  Intraoperative findings:  1.  Severe bladder trabeculations, ureteral orifices orthotopic bilaterally 2.  Lateral lobe prostatic hypertrophy, high bladder neck  EBL: 25 cc  Specimens: Prostate chips  Enucleation time: 75 mins  Morcellation time: 8 minutes  Intra-op weight: 31g  Drains: 24 French three-way catheter  Indication: Ryan Gallagher is a 63 y.o. patient with BPH and multiple episodes of urinary retention.  Prostate measured 90 cc on CT scan.  After reviewing the management options for treatment, they elected to proceed with the above surgical procedure(s). We have discussed the potential benefits and risks of the procedure, side effects of the proposed treatment, the likelihood of the patient achieving the goals of the procedure, and any potential problems that might occur during the procedure or recuperation.  We specifically discussed the risks of bleeding, infection, hematuria and clot retention, need for additional procedures, possible overnight hospital stay, temporary urgency and urge incontinence, and retrograde ejaculation.  Informed consent has been obtained.   Description of procedure:  The patient was taken to the operating room and general anesthesia was induced.  The patient was placed in the dorsal lithotomy position, prepped and draped in the usual sterile fashion, and preoperative antibiotics(Cipro) were administered.  SCDs were placed for DVT prophylaxis.  A preoperative time-out was performed.   The 22 French continuous flow resectoscope was inserted into the urethra using the visual obturator  The prostate was moderate in size with a high bladder neck and lateral lobe hypertrophy. The bladder was thoroughly inspected and there were severe  bladder trabeculations.  There were no tumors, stones, or other suspicious lesions.  The ureteral orifices were located in orthotopic position.  The laser was set to 2 J and 50 Hz and was used to make an incision at the 6 o'clock position to the level of the capsule from the bladder neck to the verumontanum.  The lateral lobes were then incised circumferentially until they were disconnected from the surrounding tissue.  The capsule was examined and laser was used for meticulous hemostasis.  The 37 French resectoscope was then switched out for the 26 French nephroscope and the lobes were morcellated and the tissue sent to pathology.  A 22 French three-way catheter was inserted easily into the bladder, and CBI was initiated.  30cc were placed in the balloon.  Urine was faint pink on fast CBI.  The patient tolerated the procedure well without any immediate complications and was extubated and transferred to the recovery room in stable condition.  Urine was clear on fast CBI.  Disposition: Stable to PACU  Plan: Admit overnight, wean CBI in the morning Follow-up Monday for voiding trial in clinic  Legrand Rams, MD

## 2018-04-19 NOTE — Transfer of Care (Signed)
Immediate Anesthesia Transfer of Care Note  Patient: Ryan Gallagher  Procedure(s) Performed: HOLEP-LASER ENUCLEATION OF THE PROSTATE WITH MORCELLATION (N/A )  Patient Location: PACU  Anesthesia Type:General  Level of Consciousness: drowsy  Airway & Oxygen Therapy: Patient Spontanous Breathing and Patient connected to face mask oxygen  Post-op Assessment: Report given to RN and Post -op Vital signs reviewed and stable  Post vital signs: stable  Last Vitals:  Vitals Value Taken Time  BP 115/72 04/19/2018 10:04 AM  Temp    Pulse 81 04/19/2018 10:10 AM  Resp 15 04/19/2018 10:10 AM  SpO2 100 % 04/19/2018 10:10 AM  Vitals shown include unvalidated device data.  Last Pain:  Vitals:   04/19/18 0618  TempSrc: Temporal  PainSc: 0-No pain         Complications: No apparent anesthesia complications

## 2018-04-19 NOTE — Anesthesia Post-op Follow-up Note (Signed)
Anesthesia QCDR form completed.        

## 2018-04-20 DIAGNOSIS — N400 Enlarged prostate without lower urinary tract symptoms: Secondary | ICD-10-CM

## 2018-04-20 MED ORDER — OXYCODONE-ACETAMINOPHEN 5-325 MG PO TABS: 1.0000 | ORAL_TABLET | ORAL | 0 refills | Status: DC | PRN

## 2018-04-20 MED ORDER — OXYBUTYNIN CHLORIDE ER 10 MG PO TB24: 10.0000 mg | ORAL_TABLET | Freq: Every day | ORAL | 0 refills | Status: AC

## 2018-04-20 MED ORDER — OXYBUTYNIN CHLORIDE ER 5 MG PO TB24
10.0000 mg | ORAL_TABLET | Freq: Once | ORAL | Status: AC
  Administered 2018-04-20: 10 mg via ORAL
  Filled 2018-04-20: qty 2

## 2018-04-20 MED ORDER — SULFAMETHOXAZOLE-TRIMETHOPRIM 800-160 MG PO TABS: 1.0000 | ORAL_TABLET | Freq: Every day | ORAL | 0 refills | Status: DC

## 2018-04-20 NOTE — Progress Notes (Signed)
   Subjective Afebrile, NAE, pain well controlled Urine with light red urine with CBI off  Physical Exam: BP (!) 99/59 (BP Location: Left Arm)   Pulse (!) 52   Temp 98.2 F (36.8 C) (Oral)   Resp 20   Ht 6\' 5"  (1.956 m)   Wt 98.9 kg   SpO2 96%   BMI 25.85 kg/m    Constitutional:  Alert and oriented, No acute distress. Respiratory: Normal respiratory effort, no increased work of breathing. GI: Abdomen is soft, non-tender, non-distended Drains: 54F 3-way light red with CBI off  Foley irrigated with 500cc NS, return of 10cc old dark clot. 20cc added to balloon for a total of 50cc. Foley irrigated easily with return of clear fluid.  Assessment & Plan:   Healthy 63 yo M POD#1 HOLEP for BPH with history of urinary retention x2. Recovering appropriately, urine clear this morning after irrigation and adding 20cc to balloon for a total of 50cc in setting of large resection.  Cap CBI, continue foley to drainage Discharge home today Follow up Monday in clinic for foley removal  Ryan ComeBrian C Chrysta Fulcher, MD

## 2018-04-20 NOTE — Discharge Summary (Signed)
Physician Discharge Summary  Patient ID: Ryan Gallagher MRN: [id number] DOB/AGE: 65/20/1957 65 y.o.  Admit date: 04/19/2018 Discharge date: 04/20/2018  Admission Diagnoses: BPH with urinary obstruction/retention  Discharge Diagnoses:  Active Problems:   BPH with obstruction/lower urinary tract symptoms   Discharged Condition: good  Hospital Course:  Patient underwent uncomplicated HOLEP on 04/19/2018.  He was [place] for [place].  On postop day 1 catheter was irrigated for minimal old clot and urine remained faint pink off CBI.  A total of 50 cc were in the catheter balloon.  His pain was controlled, he was tolerating general diet, and he was discharged home in good condition.  Follow-up Monday 1/27 for Foley removal in clinic.  Consults: None  Significant Diagnostic Studies: None  Discharge Exam: Blood pressure (!) 99/59, pulse (!) 52, temperature 98.2 F (36.8 C), temperature source Oral, resp. rate 20, height 6\' 5"  (1.956 m), weight 98.9 kg, SpO2 96 %.  Alert, no acute distress Abdomen soft, nontender 24 French three-way Foley with faint pink urine off CBI  Disposition:   Discharge Instructions    Discharge instructions   Complete by:  As directed    You underwent HOLEP for BPH with urinary obstruction.  It is normal to have some pink to red urine in the catheter over the weekend.  Drink plenty of fluids to keep the bladder flushed.  No strenuous activity for 2 to 3 weeks.  Call the urology clinic or present to the emergency department if your catheter is not draining with lower abdominal pain and pressure, large clots bigger than a quarter in the urine, or inability to urinate.  Take Tylenol as needed for mild pain, narcotic as needed for more severe pain.  Take oxybutynin Saturday and Sunday to prevent bladder spasms with the catheter in place.  Do not take oxybutynin on Monday.  Follow-up Monday in  clinic for Foley removal.     Allergies as of 04/20/2018   No Known Allergies     Medication List    STOP taking these medications   ciprofloxacin 500 MG tablet Commonly known as:  CIPRO   tamsulosin 0.4 MG Caps capsule Commonly known as:  FLOMAX     TAKE these medications   aspirin-acetaminophen-caffeine 250-250-65 MG tablet Commonly known as:  EXCEDRIN MIGRAINE Take 1 tablet by mouth every 6 (six) hours as needed for headache.   calcium carbonate 500 MG chewable tablet Commonly known as:  TUMS - dosed in mg elemental calcium Chew 1 tablet by mouth as needed for indigestion or heartburn.   clotrimazole 1 % cream Commonly known as:  LOTRIMIN Apply to affected area 2 times daily   multivitamin with minerals tablet Take 1 tablet by mouth daily.   oxybutynin 10 MG 24 hr tablet Commonly known as:  DITROPAN XL Take 1 tablet (10 mg total) by mouth daily for 2 days. Take Saturday 1/25 and Sunday 1/26 in morning to prevent bladder spasms   oxyCODONE-acetaminophen 5-325 MG tablet Commonly known as:  PERCOCET/ROXICET Take 1-2 tablets by mouth every 4 (four) hours as needed for moderate pain.   sulfamethoxazole-trimethoprim 800-160 MG tablet Commonly known as:  BACTRIM DS,SEPTRA DS Take 1 tablet by mouth daily.        Signed: Sondra Come 04/20/2018, 9:06 AM

## 2018-04-21 NOTE — Anesthesia Postprocedure Evaluation (Signed)
Anesthesia Post Note  Patient: Ryan Gallagher  Procedure(s) Performed: HOLEP-LASER ENUCLEATION OF THE PROSTATE WITH MORCELLATION (N/A )  Patient location during evaluation: PACU Anesthesia Type: General Level of consciousness: awake and alert Pain management: pain level controlled Vital Signs Assessment: post-procedure vital signs reviewed and stable Respiratory status: spontaneous breathing, nonlabored ventilation and respiratory function stable Cardiovascular status: blood pressure returned to baseline and stable Postop Assessment: no apparent nausea or vomiting Anesthetic complications: no     Last Vitals:  Vitals:   04/19/18 2012 04/20/18 0412  BP: 101/64 (!) 99/59  Pulse: 63 (!) 52  Resp: 20 20  Temp: 36.7 C 36.8 C  SpO2: 95% 96%    Last Pain:  Vitals:   04/20/18 0730  TempSrc:   PainSc: 3                  Jovita GammaKathryn L Fitzgerald

## 2018-04-22 ENCOUNTER — Ambulatory Visit (INDEPENDENT_AMBULATORY_CARE_PROVIDER_SITE_OTHER): Payer: Self-pay | Admitting: Family Medicine

## 2018-04-22 DIAGNOSIS — N138 Other obstructive and reflux uropathy: Secondary | ICD-10-CM

## 2018-04-22 DIAGNOSIS — N401 Enlarged prostate with lower urinary tract symptoms: Secondary | ICD-10-CM

## 2018-04-22 NOTE — Progress Notes (Signed)
Catheter Removal  Patient is present today for a catheter removal.  84ml of water was drained from the balloon. A 24FR foley cath was removed from the bladder no complications were noted . Patient tolerated well.  Preformed by: Teressa Lower, CMA  Follow up/ Additional notes: 6 weeks

## 2018-04-23 ENCOUNTER — Telehealth: Payer: Self-pay

## 2018-04-23 LAB — SURGICAL PATHOLOGY

## 2018-04-23 NOTE — Telephone Encounter (Signed)
-----   Message from Sondra Come, MD sent at 04/23/2018  1:54 PM EST ----- No cancer was seen in the prostate chips from HOLEP surgery. Keep scheduled follow up in 6 weeks.  Legrand Rams, MD 04/23/2018

## 2018-04-23 NOTE — Telephone Encounter (Signed)
Left patient mess to call 

## 2018-04-26 NOTE — Telephone Encounter (Signed)
Patient notified

## 2018-05-30 ENCOUNTER — Ambulatory Visit (INDEPENDENT_AMBULATORY_CARE_PROVIDER_SITE_OTHER): Payer: Self-pay | Admitting: Urology

## 2018-05-30 ENCOUNTER — Encounter: Payer: Self-pay | Admitting: Urology

## 2018-05-30 VITALS — BP 136/78 | HR 76 | Ht 77.0 in | Wt 212.0 lb

## 2018-05-30 DIAGNOSIS — N138 Other obstructive and reflux uropathy: Secondary | ICD-10-CM

## 2018-05-30 DIAGNOSIS — N401 Enlarged prostate with lower urinary tract symptoms: Secondary | ICD-10-CM

## 2018-05-30 LAB — BLADDER SCAN AMB NON-IMAGING

## 2018-05-30 NOTE — Patient Instructions (Signed)
Kegel Exercises  Kegel exercises help strengthen the muscles that support the rectum, vagina, small intestine, bladder, and uterus. Doing Kegel exercises can help:   Improve bladder and bowel control.   Improve sexual response.   Reduce problems and discomfort during pregnancy.  Kegel exercises involve squeezing your pelvic floor muscles, which are the same muscles you squeeze when you try to stop the flow of urine. The exercises can be done while sitting, standing, or lying down, but it is best to vary your position.  Exercises  1. Squeeze your pelvic floor muscles tight. You should feel a tight lift in your rectal area. If you are a male, you should also feel a tightness in your vaginal area. Keep your stomach, buttocks, and legs relaxed.  2. Hold the muscles tight for up to 10 seconds.  3. Relax your muscles.  Repeat this exercise 50 times a day or as many times as told by your health care provider. Continue to do this exercise for at least 4-6 weeks or for as long as told by your health care provider.  This information is not intended to replace advice given to you by your health care provider. Make sure you discuss any questions you have with your health care provider.  Document Released: 02/28/2012 Document Revised: 07/24/2016 Document Reviewed: 01/31/2015  Elsevier Interactive Patient Education  2019 [employer]

## 2018-05-30 NOTE — Progress Notes (Signed)
   05/30/2018 8:46 AM   Ryan Gallagher [birth year] [id number]  Reason for visit: Follow up s/p HOLEP  HPI: I saw [name] back in urology clinic today in follow-up after HOLEP on 04/19/2018.  Briefly, he is a healthy 63 year old male with a 90g prostate that was in Foley dependent urinary retention.  He had severe bladder trabeculations on cystoscopy.  Pathology showed 39 g of benign prostatic tissue.  He reports he is doing well since surgery and voiding with a strong stream.  PVR in clinic today is 26 cc.  He is having mild to moderate urgency and frequency, but minimal leakage.  He has nocturia 1-2x per night.  Overall, he is very happy with his symptoms.  In summary, the patient is a healthy 63 year old male recovering as expected after HOLEP.  I encouraged him to perform Kegel exercises to strengthen the pelvic floor and help improve his residual LUTS.  RTC 4 months for PVR and symptom check  A total of 10 minutes were spent face-to-face with the patient, greater than 50% was spent in patient education, counseling, and coordination of care regarding postop expectations.   Sondra Come, MD  [facility] [address] [place], [place] [address] [phone]

## 2018-06-03 ENCOUNTER — Ambulatory Visit: Payer: Self-pay | Admitting: Urology

## 2018-07-25 ENCOUNTER — Ambulatory Visit (INDEPENDENT_AMBULATORY_CARE_PROVIDER_SITE_OTHER): Payer: Medicare Other | Admitting: Adult Health

## 2018-07-25 ENCOUNTER — Other Ambulatory Visit: Payer: Self-pay

## 2018-07-25 ENCOUNTER — Encounter: Payer: Self-pay | Admitting: Adult Health

## 2018-07-25 ENCOUNTER — Other Ambulatory Visit: Payer: Self-pay | Admitting: Adult Health

## 2018-07-25 VITALS — BP 118/68 | HR 67 | Resp 16 | Ht 77.0 in | Wt 214.0 lb

## 2018-07-25 DIAGNOSIS — G43119 Migraine with aura, intractable, without status migrainosus: Secondary | ICD-10-CM

## 2018-07-25 DIAGNOSIS — H9313 Tinnitus, bilateral: Secondary | ICD-10-CM

## 2018-07-25 DIAGNOSIS — G8929 Other chronic pain: Secondary | ICD-10-CM

## 2018-07-25 DIAGNOSIS — H9191 Unspecified hearing loss, right ear: Secondary | ICD-10-CM | POA: Diagnosis not present

## 2018-07-25 DIAGNOSIS — M25512 Pain in left shoulder: Secondary | ICD-10-CM | POA: Diagnosis not present

## 2018-07-25 DIAGNOSIS — Z1211 Encounter for screening for malignant neoplasm of colon: Secondary | ICD-10-CM | POA: Diagnosis not present

## 2018-07-25 DIAGNOSIS — R0683 Snoring: Secondary | ICD-10-CM

## 2018-07-25 DIAGNOSIS — M25511 Pain in right shoulder: Secondary | ICD-10-CM

## 2018-07-25 MED ORDER — LIDOCAINE 4 % EX PTCH: 1.0000 | MEDICATED_PATCH | Freq: Every day | CUTANEOUS | 1 refills | Status: DC | PRN

## 2018-07-25 MED ORDER — CELECOXIB 100 MG PO CAPS: 100.0000 mg | ORAL_CAPSULE | Freq: Two times a day (BID) | ORAL | 1 refills | Status: DC

## 2018-07-25 MED ORDER — PNEUMOCOCCAL 13-VAL CONJ VACC IM SUSP: 0.5000 mL | INTRAMUSCULAR | 0 refills | Status: AC

## 2018-07-25 NOTE — Progress Notes (Signed)
[facility] [address] [place], [place] [address]  Internal MEDICINE  Office Visit Note  Patient Name: Ryan Gallagher  [id number]  [id number]  Date of Service: 07/25/2018   Complaints/HPI Pt is here for establishment of PCP. Chief Complaint  Patient presents with  . Medical Management of Chronic Issues    new patient establish care, shoulders constant pain, pops and cracks can not get comfortable in the bed, pt complains of ringing in both ears   . Quality Metric Gaps    colonoscopy needed   HPI Pt is here to establish care. Pt reports he has not seen a provider in quite some time.  His main issue is that he has bilateral shoulder pain with constant "pops and cracks".  He has been to have difficulty sleeping due to this pain.  He denies any trauma or injuries.  He does have a intensely physical job that he is been doing for most of his life.  He is also complaining of some ringing in his ears.  He is in need of a colonoscopy to close his quality gaps.  He denies tobacco, alcohol, or illicit drug use.  Current Medication: Outpatient Encounter Medications as of 07/25/2018  Medication Sig  . aspirin-acetaminophen-caffeine (EXCEDRIN MIGRAINE) 250-250-65 MG per tablet Take 1 tablet by mouth every 6 (six) hours as needed for headache.  . calcium carbonate (TUMS - DOSED IN MG ELEMENTAL CALCIUM) 500 MG chewable tablet Chew 1 tablet by mouth as needed for indigestion or heartburn.  . Multiple Vitamins-Minerals (MULTIVITAMIN WITH MINERALS) tablet Take 1 tablet by mouth daily.  . [DISCONTINUED] clotrimazole (LOTRIMIN) 1 % cream Apply to affected area 2 times daily (Patient not taking: Reported on 04/09/2018)   No facility-administered encounter medications on file as of 07/25/2018.     Surgical History: Past Surgical History:  Procedure Laterality Date  . HOLEP-LASER ENUCLEATION OF THE PROSTATE WITH MORCELLATION N/A 04/19/2018   Procedure: HOLEP-LASER ENUCLEATION OF THE  PROSTATE WITH MORCELLATION;  Surgeon: Sondra ComeSninsky, Brian C, MD;  Location: [facility] ORS;  Service: Urology;  Laterality: N/A;  . NO PAST SURGERIES      Medical History: Past Medical History:  Diagnosis Date  . Arthritis   . Diverticulitis   . Enlarged prostate   . GERD (gastroesophageal reflux disease)    OCC  . Headache    MIGRAINES  . History of hiatal hernia     Family History: Family History  Problem Relation Age of Onset  . Alzheimer's disease Mother   . Alzheimer's disease Father   . Diabetes Paternal Uncle   . Prostate cancer Neg Hx   . Bladder Cancer Neg Hx   . Kidney cancer Neg Hx     Social History   Socioeconomic History  . Marital status: Single    Spouse name: Not on file  . Number of children: Not on file  . Years of education: Not on file  . Highest education level: Not on file  Occupational History  . Not on file  Social Needs  . Financial resource strain: Not on file  . Food insecurity:    Worry: Not on file    Inability: Not on file  . Transportation needs:    Medical: Not on file    Non-medical: Not on file  Tobacco Use  . Smoking status: Current Every Day Smoker    Packs/day: 0.50    Years: 50.00    Pack years: 25.00    Types: Cigarettes  . Smokeless  tobacco: Never Used  Substance and Sexual Activity  . Alcohol use: Yes    Comment: occ  . Drug use: Never  . Sexual activity: Yes    Birth control/protection: None  Lifestyle  . Physical activity:    Days per week: Not on file    Minutes per session: Not on file  . Stress: Not on file  Relationships  . Social connections:    Talks on phone: Not on file    Gets together: Not on file    Attends religious service: Not on file    Active member of club or organization: Not on file    Attends meetings of clubs or organizations: Not on file    Relationship status: Not on file  . Intimate partner violence:    Fear of current or ex partner: Not on file    Emotionally abused: Not on file     Physically abused: Not on file    Forced sexual activity: Not on file  Other Topics Concern  . Not on file  Social History Narrative  . Not on file     Review of Systems  Constitutional: Negative.  Negative for chills, fatigue and unexpected weight change.  HENT: Negative.  Negative for congestion, rhinorrhea, sneezing and sore throat.   Eyes: Negative for redness.  Respiratory: Negative.  Negative for cough, chest tightness and shortness of breath.   Cardiovascular: Negative.  Negative for chest pain and palpitations.  Gastrointestinal: Negative.  Negative for abdominal pain, constipation, diarrhea, nausea and vomiting.  Endocrine: Negative.   Genitourinary: Negative.  Negative for dysuria and frequency.  Musculoskeletal: Negative.  Negative for arthralgias, back pain, joint swelling and neck pain.  Skin: Negative.  Negative for rash.  Allergic/Immunologic: Negative.   Neurological: Negative.  Negative for tremors and numbness.  Hematological: Negative for adenopathy. Does not bruise/bleed easily.  Psychiatric/Behavioral: Negative.  Negative for behavioral problems, sleep disturbance and suicidal ideas. The patient is not nervous/anxious.     Vital Signs: BP 118/68 (BP Location: Left Arm, Patient Position: Sitting, Cuff Size: Normal)   Pulse 67   Resp 16   Ht  (1.956 m)   Wt 214 lb (97.1 kg)   SpO2 98%   BMI 25.38 kg/m    Physical Exam Vitals signs and nursing note reviewed.  Constitutional:      General: He is not in acute distress.    Appearance: He is well-developed. He is not diaphoretic.  HENT:     Head: Normocephalic and atraumatic.     Mouth/Throat:     Pharynx: No oropharyngeal exudate.  Eyes:     Pupils: Pupils are equal, round, and reactive to light.  Neck:     Musculoskeletal: Normal range of motion and neck supple.     Thyroid: No thyromegaly.     Vascular: No JVD.     Trachea: No tracheal deviation.  Cardiovascular:     Rate and Rhythm: Normal  rate and regular rhythm.     Heart sounds: Normal heart sounds. No murmur. No friction rub. No gallop.   Pulmonary:     Effort: Pulmonary effort is normal. No respiratory distress.     Breath sounds: Normal breath sounds. No wheezing or rales.  Chest:     Chest wall: No tenderness.  Abdominal:     Palpations: Abdomen is soft.     Tenderness: There is no abdominal tenderness. There is no guarding.  Musculoskeletal: Normal range of motion.  Lymphadenopathy:  Cervical: No cervical adenopathy.  Skin:    General: Skin is warm and dry.  Neurological:     Mental Status: He is alert and oriented to person, place, and time.     Cranial Nerves: No cranial nerve deficit.  Psychiatric:        Behavior: Behavior normal.        Thought Content: Thought content normal.        Judgment: Judgment normal.       Assessment/Plan: 1. Intractable migraine with aura without status migrainosus PT has migraines at times with aura. He reports about 7 headache days per month. We discussed injectable medications for management of these headaches.  2. Tinnitus of both ears ENT eval for Tinnitus as well as hearing loss.   3. Hearing loss of right ear, unspecified hearing loss type PT would like to see ENT for this hearing loss eval.   4. Loud snoring We discussed home sleep study, patient does not wish to do this currently, would like to get some things completed before.   5. Chronic pain of both shoulders Will try lidocaine patches and celebrex to help with arthritis pain in shoulders. PT will get xrays of each shoulder done in the coming weeks. - Lidocaine (HM LIDOCAINE PATCH) 4 % PTCH; Apply 1 patch topically daily as needed.  Dispense: 12 patch; Refill: 1 - celecoxib (CELEBREX) 100 MG capsule; Take 1 capsule (100 mg total) by mouth 2 (two) times daily.  Dispense: 60 capsule; Refill: 1 - DG Shoulder Left; Future - DG Shoulder Right; Future  6. Screening for colon cancer Cologuard forms sent  for patient.   General Counseling: kilyn pingleton understanding of the findings of todays visit and agrees with plan of treatment. I have discussed any further diagnostic evaluation that may be needed or ordered today. We also reviewed his medications today. he has been encouraged to call the office with any questions or concerns that should arise related to todays visit.  No orders of the defined types were placed in this encounter.   No orders of the defined types were placed in this encounter.   Time spent: 30 Minutes   This patient was seen by Blima Ledger Gallagher in Collaboration with Dr Lyndon Code as a part of collaborative care agreement  Ryan Gallagher Internal Medicine

## 2018-07-25 NOTE — Patient Instructions (Signed)

## 2018-07-26 ENCOUNTER — Telehealth: Payer: Self-pay | Admitting: Adult Health

## 2018-07-26 NOTE — Telephone Encounter (Signed)
Cologuard order form faxed to [employer].  [phone]

## 2018-08-05 ENCOUNTER — Encounter: Payer: Self-pay | Admitting: Adult Health

## 2018-08-07 DIAGNOSIS — Z1212 Encounter for screening for malignant neoplasm of rectum: Secondary | ICD-10-CM | POA: Diagnosis not present

## 2018-08-07 DIAGNOSIS — Z1211 Encounter for screening for malignant neoplasm of colon: Secondary | ICD-10-CM | POA: Diagnosis not present

## 2018-08-15 ENCOUNTER — Telehealth: Payer: Self-pay

## 2018-08-15 NOTE — Telephone Encounter (Signed)
lmom to call us back cologuard

## 2018-08-15 NOTE — Telephone Encounter (Signed)
Pt advised cologuard  Came back positive we send referral for GI consultation to [facility] clinic

## 2018-08-21 ENCOUNTER — Encounter: Payer: Self-pay | Admitting: Adult Health

## 2018-08-21 LAB — COLOGUARD: Cologuard: POSITIVE — AB

## 2018-08-23 ENCOUNTER — Other Ambulatory Visit: Payer: Self-pay

## 2018-08-23 ENCOUNTER — Ambulatory Visit
Admission: RE | Admit: 2018-08-23 | Discharge: 2018-08-23 | Disposition: A | Discharge status: Home / Self Care | Payer: Medicare Other | Attending: Adult Health | Admitting: Adult Health

## 2018-08-23 ENCOUNTER — Ambulatory Visit
Admission: RE | Admit: 2018-08-23 | Discharge: 2018-08-23 | Disposition: A | Discharge status: Home / Self Care | Payer: Medicare Other | Source: Ambulatory Visit | Attending: Adult Health | Admitting: Adult Health

## 2018-08-23 DIAGNOSIS — G8929 Other chronic pain: Secondary | ICD-10-CM | POA: Diagnosis not present

## 2018-08-23 DIAGNOSIS — M25511 Pain in right shoulder: Secondary | ICD-10-CM | POA: Diagnosis not present

## 2018-08-23 DIAGNOSIS — M25512 Pain in left shoulder: Secondary | ICD-10-CM | POA: Insufficient documentation

## 2018-09-09 ENCOUNTER — Ambulatory Visit (INDEPENDENT_AMBULATORY_CARE_PROVIDER_SITE_OTHER): Payer: Medicare Other | Admitting: Adult Health

## 2018-09-09 ENCOUNTER — Other Ambulatory Visit: Payer: Self-pay

## 2018-09-09 ENCOUNTER — Encounter: Payer: Self-pay | Admitting: Adult Health

## 2018-09-09 VITALS — BP 124/70 | HR 66 | Resp 16 | Ht 78.0 in | Wt 220.0 lb

## 2018-09-09 DIAGNOSIS — H9313 Tinnitus, bilateral: Secondary | ICD-10-CM | POA: Diagnosis not present

## 2018-09-09 DIAGNOSIS — M25511 Pain in right shoulder: Secondary | ICD-10-CM

## 2018-09-09 DIAGNOSIS — G8929 Other chronic pain: Secondary | ICD-10-CM

## 2018-09-09 DIAGNOSIS — G43119 Migraine with aura, intractable, without status migrainosus: Secondary | ICD-10-CM

## 2018-09-09 DIAGNOSIS — M25512 Pain in left shoulder: Secondary | ICD-10-CM

## 2018-09-09 NOTE — Progress Notes (Signed)
[facility] [address] [place], [place] [address]  Internal MEDICINE  Office Visit Note  Patient Name: Ryan SilvanKenneth G Gallagher  [id number] 24, 2057  [id number]  Date of Service: 09/09/2018  Chief Complaint  Patient presents with  . Medical Management of Chronic Issues    6 week follow up bilateral shoulders, review x-rays , shoulder pain coming and going waking him up at night     HPI Pt is here for follow up on shoulder pains and to review x-rays.  Pt's x-rays show bilateral degenerative changes but no acute abnormalities. Pt is taking Celebrex, and lidocaine patches. He reports some relief.  No new symptoms at this time.      Current Medication: Outpatient Encounter Medications as of 09/09/2018  Medication Sig  . aspirin-acetaminophen-caffeine (EXCEDRIN MIGRAINE) 250-250-65 MG per tablet Take 1 tablet by mouth every 6 (six) hours as needed for headache.  . calcium carbonate (TUMS - DOSED IN MG ELEMENTAL CALCIUM) 500 MG chewable tablet Chew 1 tablet by mouth as needed for indigestion or heartburn.  . celecoxib (CELEBREX) 100 MG capsule Take 1 capsule (100 mg total) by mouth 2 (two) times daily.  . Lidocaine (HM LIDOCAINE PATCH) 4 % PTCH Apply 1 patch topically daily as needed.  . Multiple Vitamins-Minerals (MULTIVITAMIN WITH MINERALS) tablet Take 1 tablet by mouth daily.   No facility-administered encounter medications on file as of 09/09/2018.     Surgical History: Past Surgical History:  Procedure Laterality Date  . HOLEP-LASER ENUCLEATION OF THE PROSTATE WITH MORCELLATION N/A 04/19/2018   Procedure: HOLEP-LASER ENUCLEATION OF THE PROSTATE WITH MORCELLATION;  Surgeon: Sondra ComeSninsky, Brian C, MD;  Location: [facility] ORS;  Service: Urology;  Laterality: N/A;  . NO PAST SURGERIES      Medical History: Past Medical History:  Diagnosis Date  . Arthritis   . Diverticulitis   . Enlarged prostate   . GERD (gastroesophageal reflux disease)    OCC  . Headache    MIGRAINES  . History of  hiatal hernia     Family History: Family History  Problem Relation Age of Onset  . Alzheimer's disease Mother   . Alzheimer's disease Father   . Diabetes Paternal Uncle   . Prostate cancer Neg Hx   . Bladder Cancer Neg Hx   . Kidney cancer Neg Hx     Social History   Socioeconomic History  . Marital status: Single    Spouse name: Not on file  . Number of children: Not on file  . Years of education: Not on file  . Highest education level: Not on file  Occupational History  . Not on file  Social Needs  . Financial resource strain: Not on file  . Food insecurity    Worry: Not on file    Inability: Not on file  . Transportation needs    Medical: Not on file    Non-medical: Not on file  Tobacco Use  . Smoking status: Current Every Day Smoker    Packs/day: 0.50    Years: 50.00    Pack years: 25.00    Types: Cigarettes  . Smokeless tobacco: Never Used  Substance and Sexual Activity  . Alcohol use: Yes    Comment: occ  . Drug use: Never  . Sexual activity: Yes    Birth control/protection: None  Lifestyle  . Physical activity    Days per week: Not on file    Minutes per session: Not on file  . Stress: Not on file  Relationships  .  Social Musicianconnections    Talks on phone: Not on file    Gets together: Not on file    Attends religious service: Not on file    Active member of club or organization: Not on file    Attends meetings of clubs or organizations: Not on file    Relationship status: Not on file  . Intimate partner violence    Fear of current or ex partner: Not on file    Emotionally abused: Not on file    Physically abused: Not on file    Forced sexual activity: Not on file  Other Topics Concern  . Not on file  Social History Narrative  . Not on file      Review of Systems  Constitutional: Negative.  Negative for chills, fatigue and unexpected weight change.  HENT: Negative.  Negative for congestion, rhinorrhea, sneezing and sore throat.   Eyes:  Negative for redness.  Respiratory: Negative.  Negative for cough, chest tightness and shortness of breath.   Cardiovascular: Negative.  Negative for chest pain and palpitations.  Gastrointestinal: Negative.  Negative for abdominal pain, constipation, diarrhea, nausea and vomiting.  Endocrine: Negative.   Genitourinary: Negative.  Negative for dysuria and frequency.  Musculoskeletal: Negative.  Negative for arthralgias, back pain, joint swelling and neck pain.  Skin: Negative.  Negative for rash.  Allergic/Immunologic: Negative.   Neurological: Negative.  Negative for tremors and numbness.  Hematological: Negative for adenopathy. Does not bruise/bleed easily.  Psychiatric/Behavioral: Negative.  Negative for behavioral problems, sleep disturbance and suicidal ideas. The patient is not nervous/anxious.     Vital Signs: BP 124/70   Pulse 66   Resp 16   Ht 6\' 6"  (1.981 m)   Wt 220 lb (99.8 kg)   SpO2 97%   BMI 25.42 kg/m    Physical Exam Vitals signs and nursing note reviewed.  Constitutional:      General: He is not in acute distress.    Appearance: He is well-developed. He is not diaphoretic.  HENT:     Head: Normocephalic and atraumatic.     Mouth/Throat:     Pharynx: No oropharyngeal exudate.  Eyes:     Pupils: Pupils are equal, round, and reactive to light.  Neck:     Musculoskeletal: Normal range of motion and neck supple.     Thyroid: No thyromegaly.     Vascular: No JVD.     Trachea: No tracheal deviation.  Cardiovascular:     Rate and Rhythm: Normal rate and regular rhythm.     Heart sounds: Normal heart sounds. No murmur. No friction rub. No gallop.   Pulmonary:     Effort: Pulmonary effort is normal. No respiratory distress.     Breath sounds: Normal breath sounds. No wheezing or rales.  Chest:     Chest wall: No tenderness.  Abdominal:     Palpations: Abdomen is soft.     Tenderness: There is no abdominal tenderness. There is no guarding.  Musculoskeletal:  Normal range of motion.  Lymphadenopathy:     Cervical: No cervical adenopathy.  Skin:    General: Skin is warm and dry.  Neurological:     Mental Status: He is alert and oriented to person, place, and time.     Cranial Nerves: No cranial nerve deficit.  Psychiatric:        Behavior: Behavior normal.        Thought Content: Thought content normal.        Judgment: Judgment  normal.    Assessment/Plan: 1. Chronic pain of both shoulders Continue Celebrex and lidocaine patches at this time. X rays show degenerative changes bilateraly.  We discussed ortho evaluation, which he said he would think about.   2. Tinnitus of both ears Pt has appt with ENT.  3. Intractable migraine with aura without status migrainosus No change.  Pt continues to take OTC medications as needed.   General Counseling: Ryan Gallagher understanding of the findings of todays visit and agrees with plan of treatment. I have discussed any further diagnostic evaluation that may be needed or ordered today. We also reviewed his medications today. he has been encouraged to call the office with any questions or concerns that should arise related to todays visit.    No orders of the defined types were placed in this encounter.   No orders of the defined types were placed in this encounter.   Time spent: 20 Minutes   This patient was seen by Orson Gear AGNP-C in Collaboration with Dr Lavera Guise as a part of collaborative care agreement     Kendell Bane AGNP-C Internal medicine

## 2018-09-10 ENCOUNTER — Encounter: Payer: Self-pay | Admitting: Adult Health

## 2018-09-10 LAB — COLOGUARD: Cologuard: POSITIVE — AB

## 2018-09-25 DIAGNOSIS — R195 Other fecal abnormalities: Secondary | ICD-10-CM | POA: Diagnosis not present

## 2018-09-25 DIAGNOSIS — Z01812 Encounter for preprocedural laboratory examination: Secondary | ICD-10-CM | POA: Diagnosis not present

## 2018-10-03 ENCOUNTER — Ambulatory Visit: Payer: Self-pay | Admitting: Urology

## 2018-12-12 ENCOUNTER — Ambulatory Visit: Payer: Medicare Other | Admitting: Nurse Practitioner

## 2018-12-12 ENCOUNTER — Ambulatory Visit: Payer: Medicare Other | Admitting: Adult Health

## 2018-12-30 ENCOUNTER — Ambulatory Visit: Payer: Medicare Other | Admitting: Adult Health

## 2019-01-09 DIAGNOSIS — Z23 Encounter for immunization: Secondary | ICD-10-CM | POA: Diagnosis not present

## 2019-09-09 IMAGING — CR RIGHT SHOULDER - 2+ VIEW
1 series · 3 of 3 positions shown · non-contrast
Comparison: 05/10/2010

CLINICAL DATA: Pain in both shoulders

EXAM:
RIGHT SHOULDER - 2+ VIEW

[Series 1: dg shoulder right · 0.14mm/px · 3 of 3 slices shown]
[im 1/3]
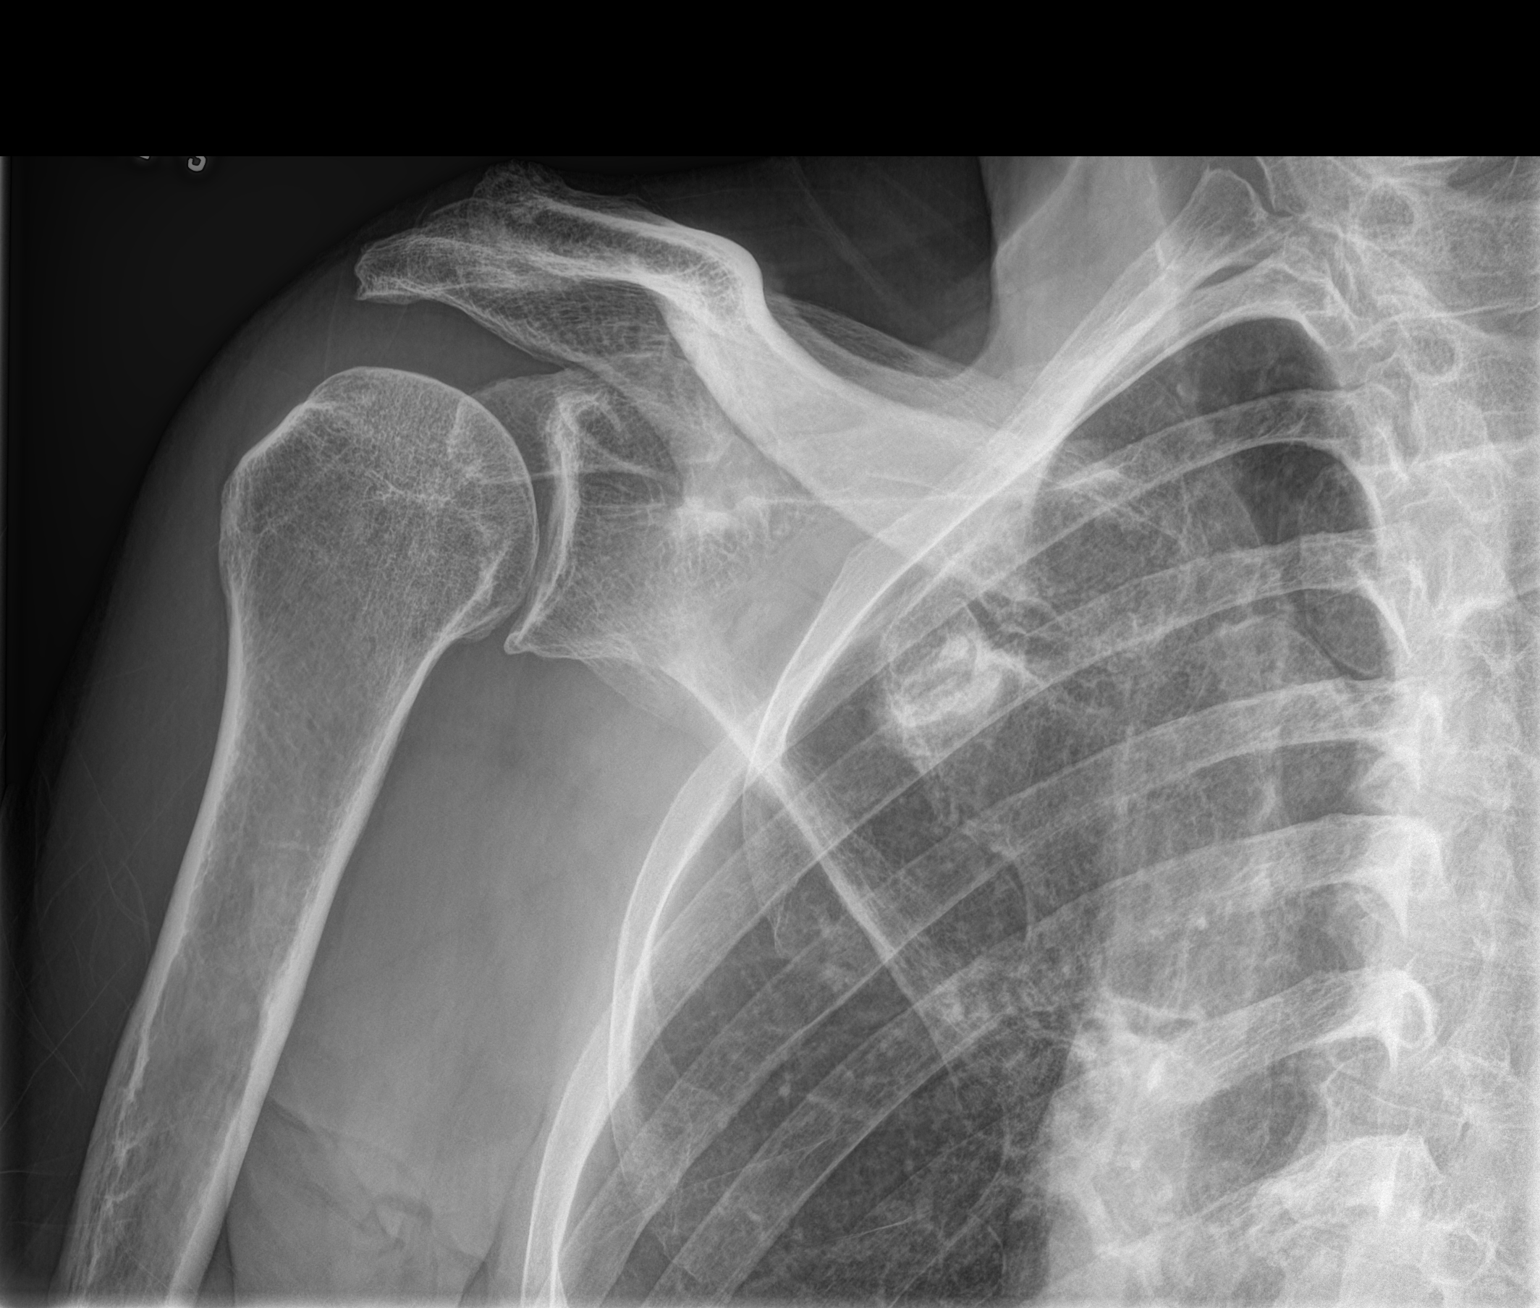
[im 2/3]
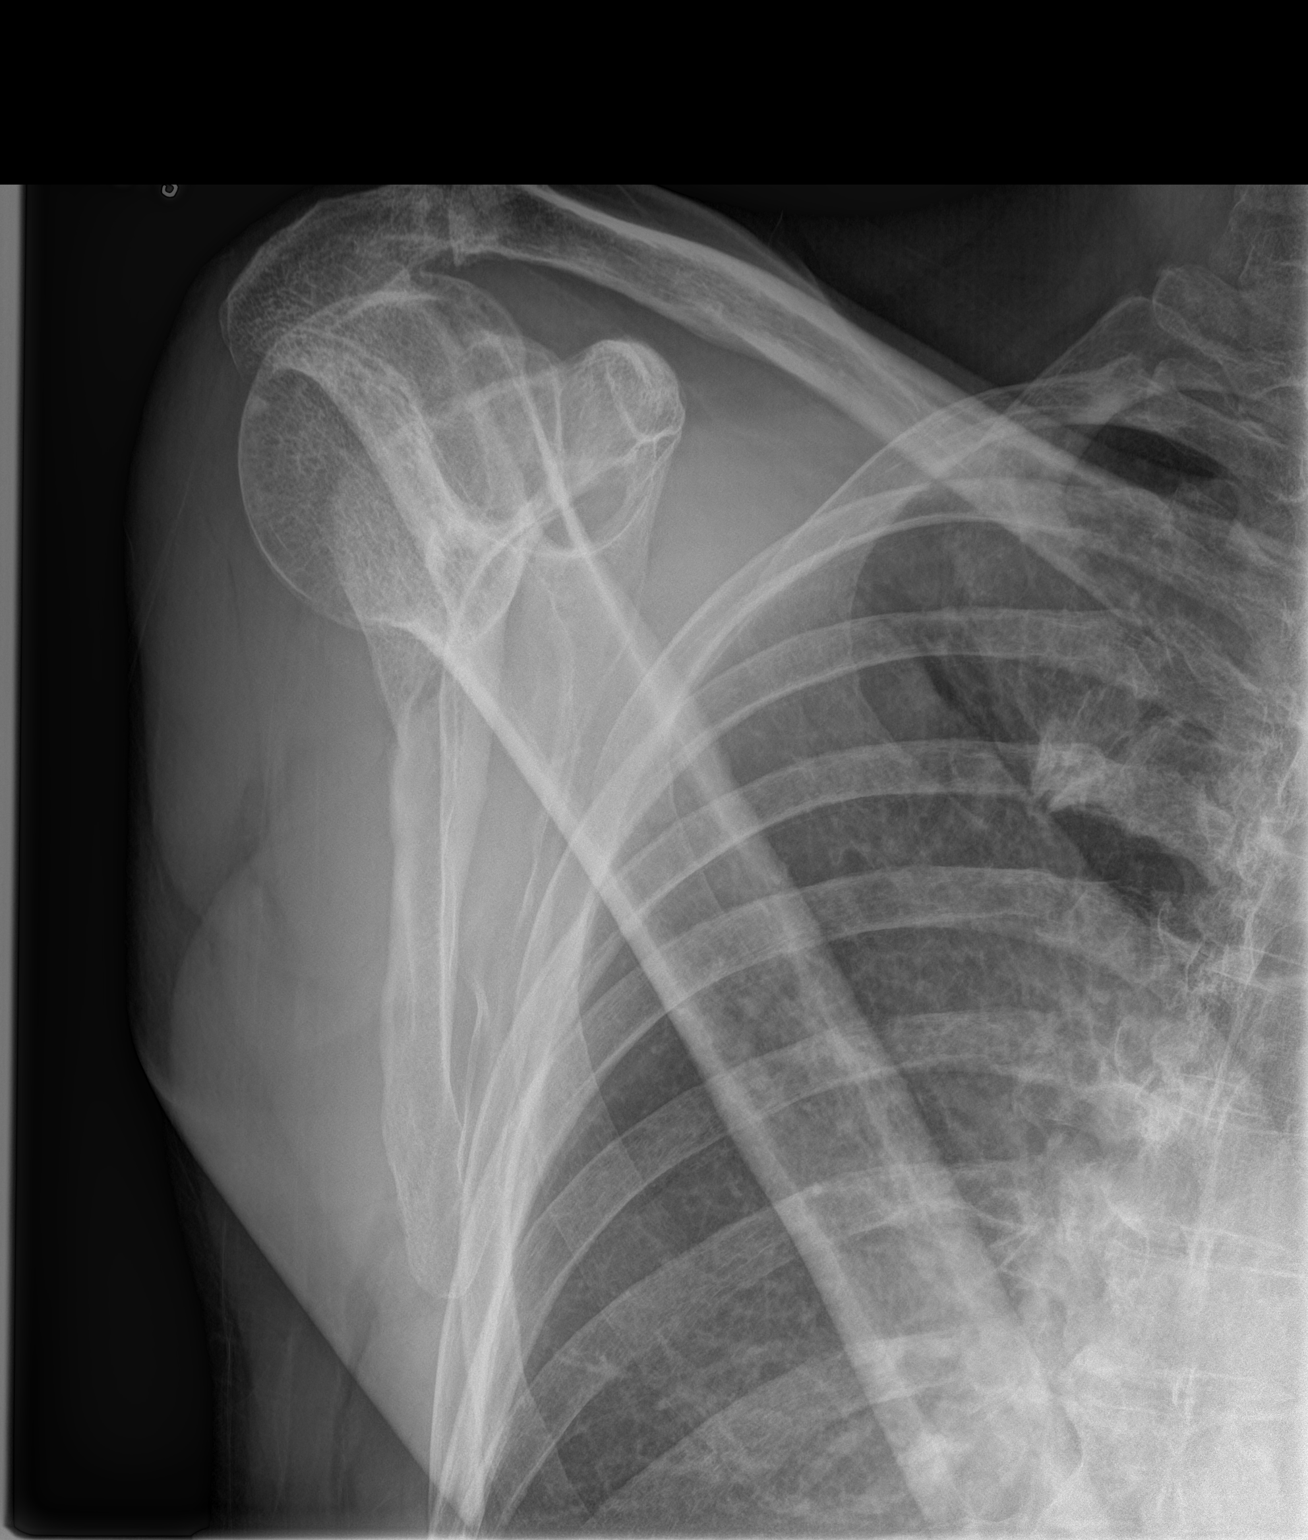
[im 3/3]
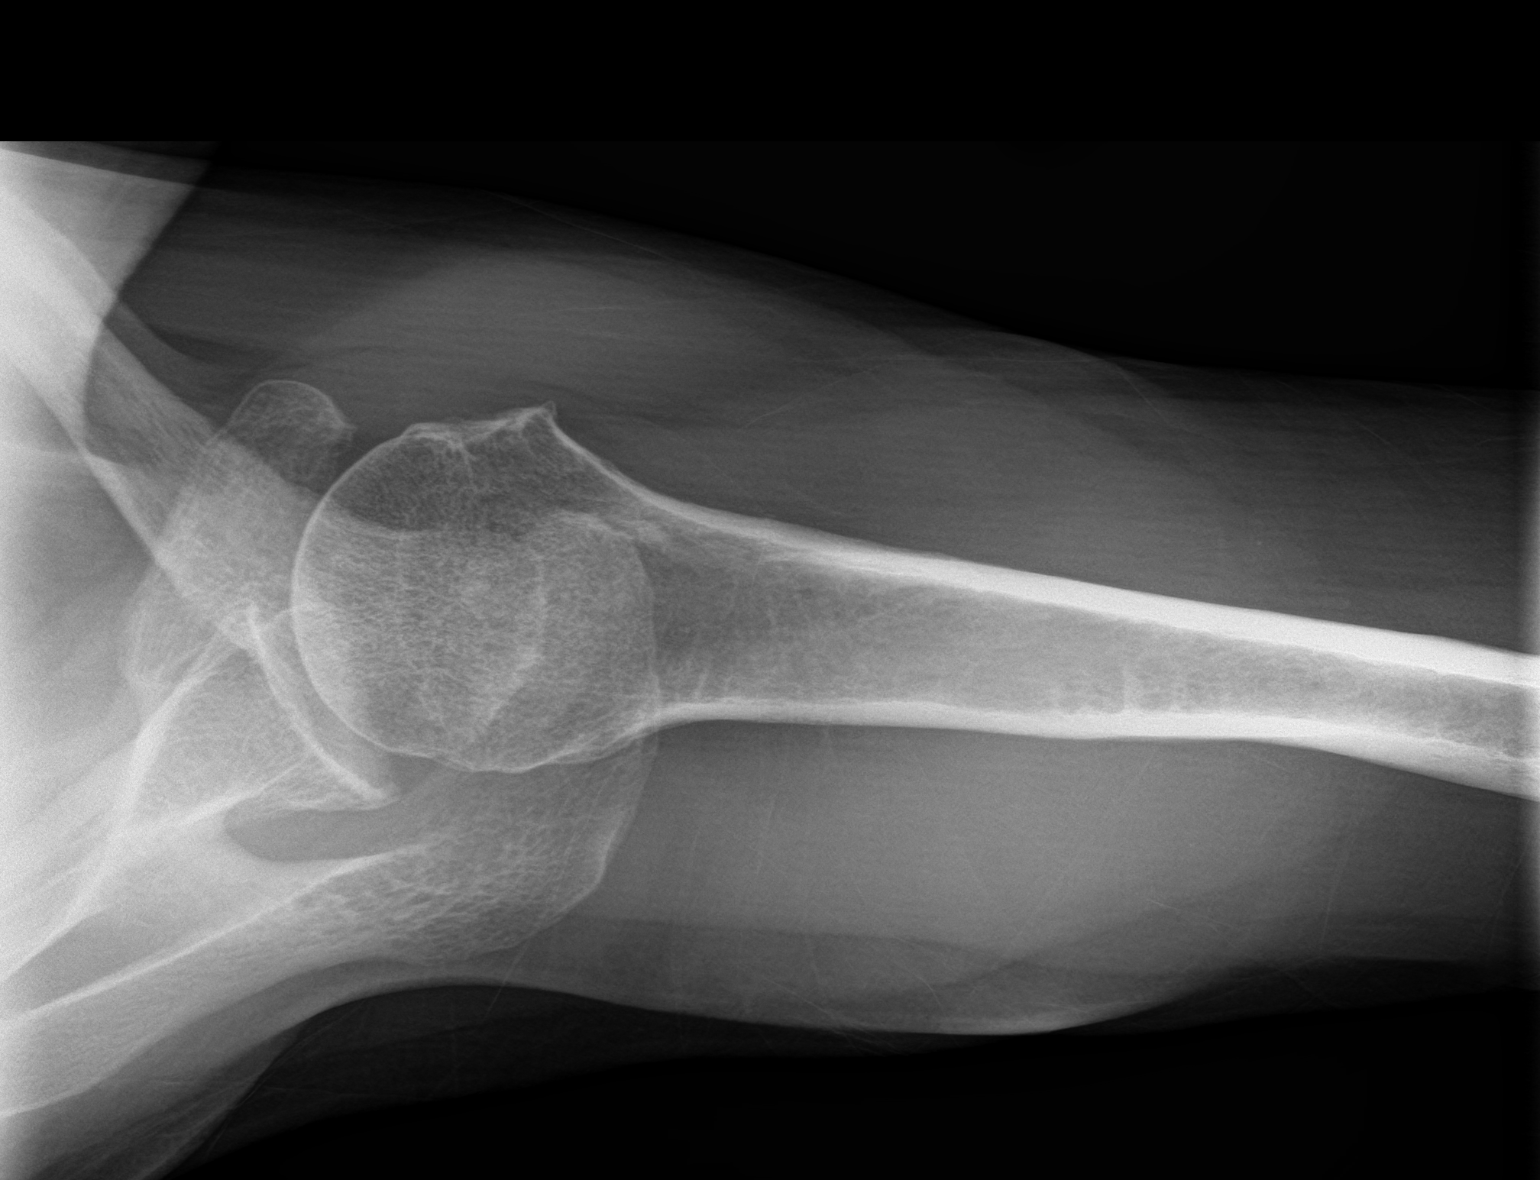

[3 of 3 positions shown; findings below may reference images not displayed]

FINDINGS: Degenerative changes in the right AC and glenohumeral joints with
joint space narrowing and spurring, most pronounced in the AC joint.
No acute bony abnormality. Specifically, no fracture, subluxation,
or dislocation.
IMPRESSION: Degenerative changes in the right shoulder as above. No acute bony
abnormality.

## 2019-09-09 IMAGING — CR LEFT SHOULDER - 2+ VIEW
1 series · 3 of 3 positions shown · non-contrast
Comparison: None.

CLINICAL DATA: Chronic pain in both shoulders

EXAM:
LEFT SHOULDER - 2+ VIEW

[Series 1: dg shoulder left · 0.14mm/px · 3 of 3 slices shown]
[im 1/3]
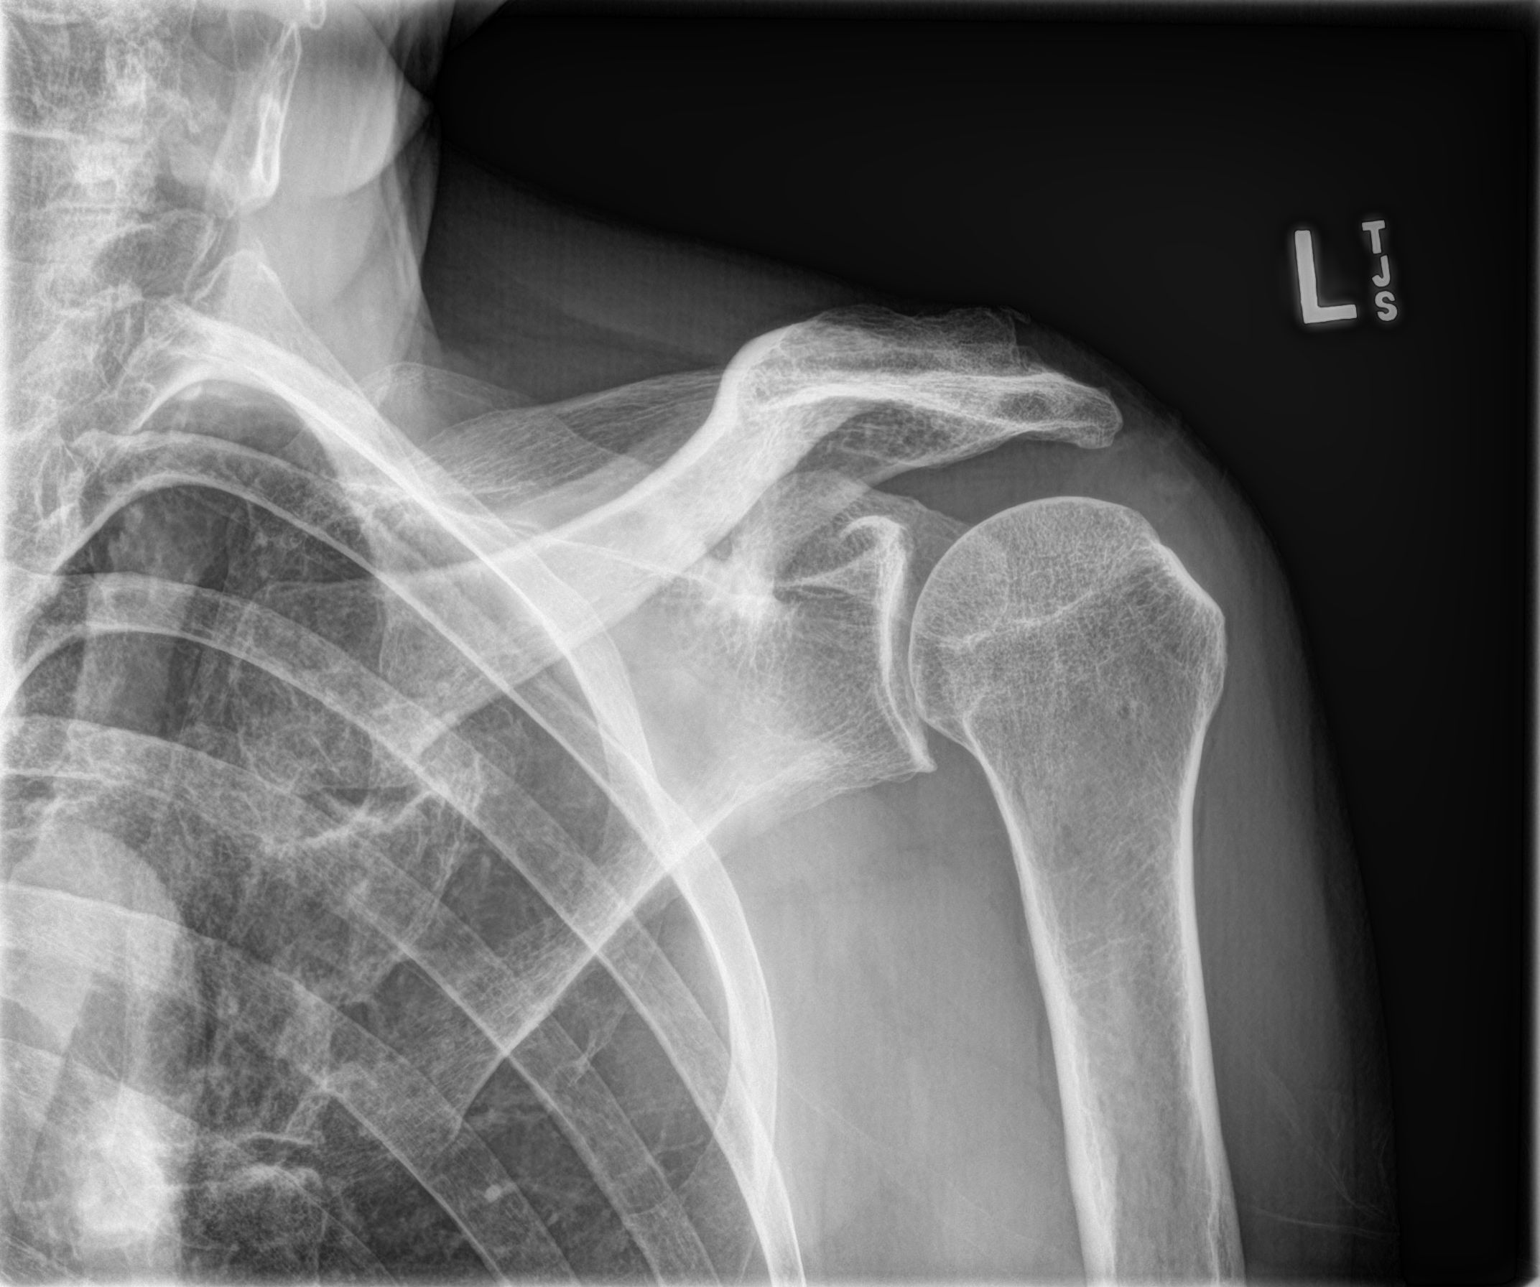
[im 2/3]
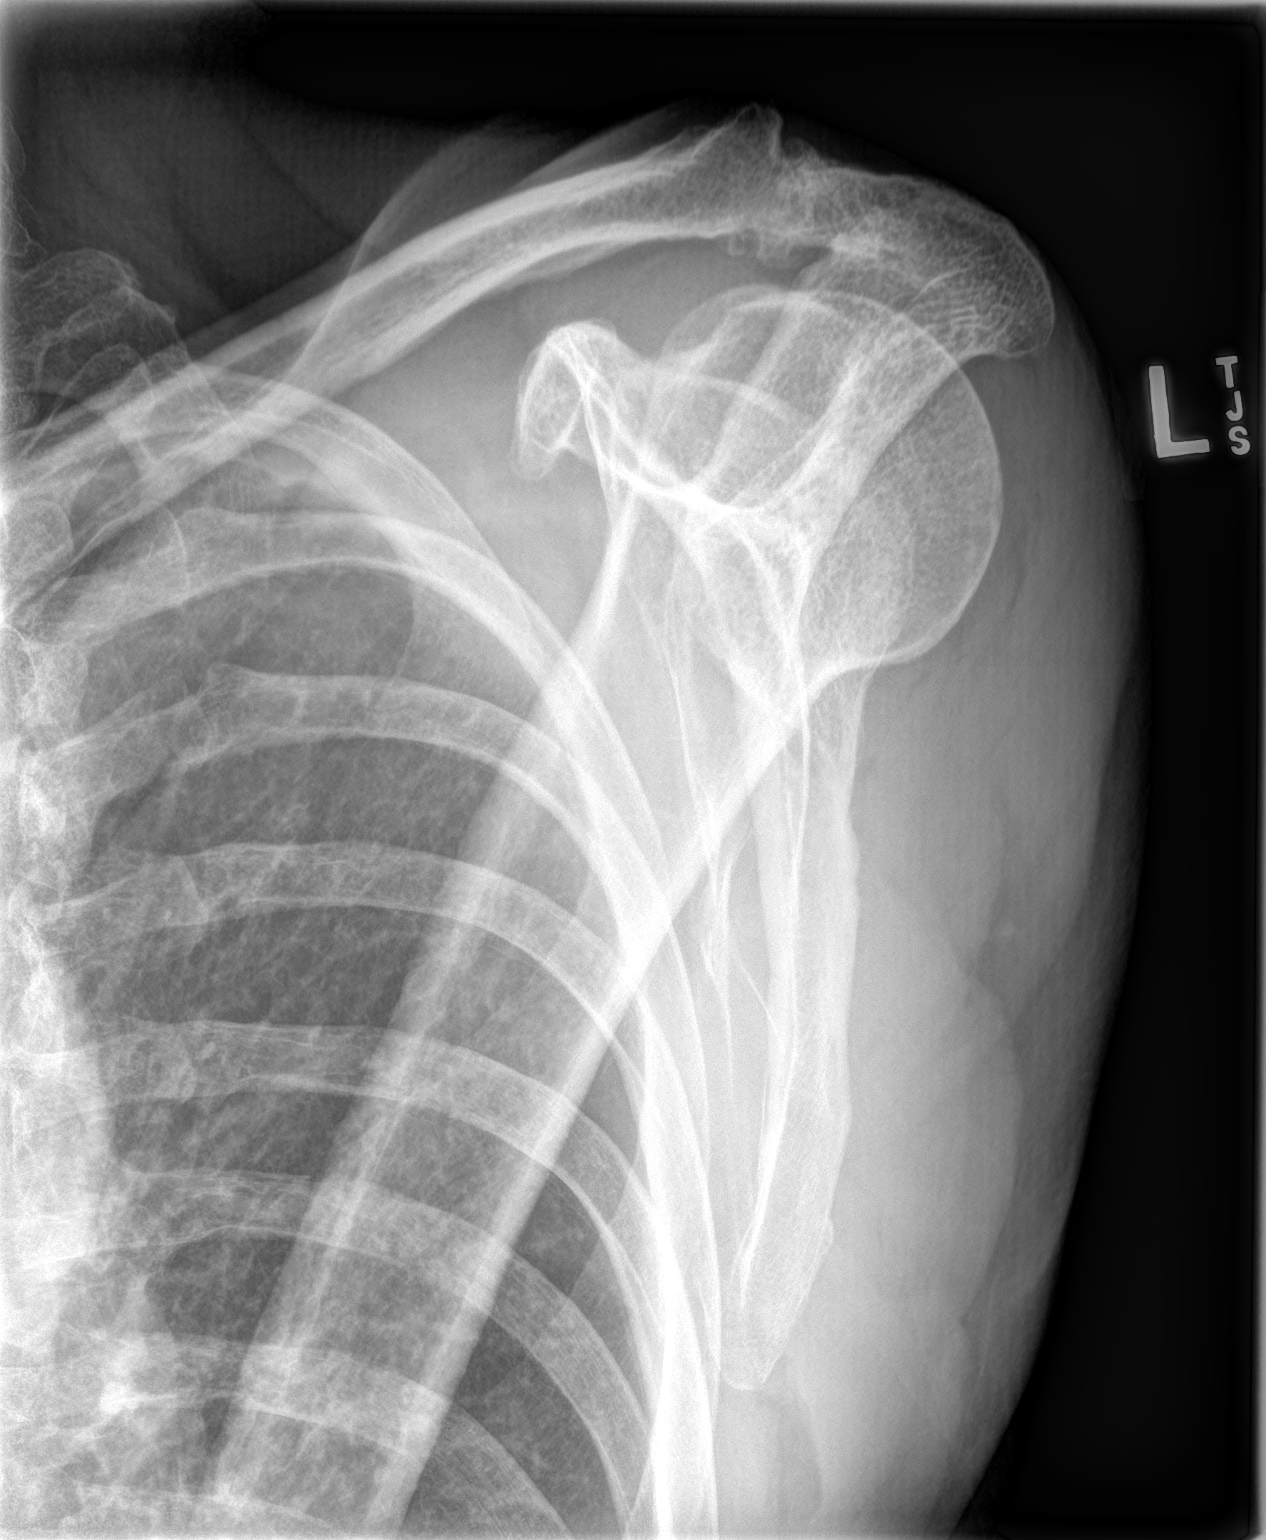
[im 3/3]
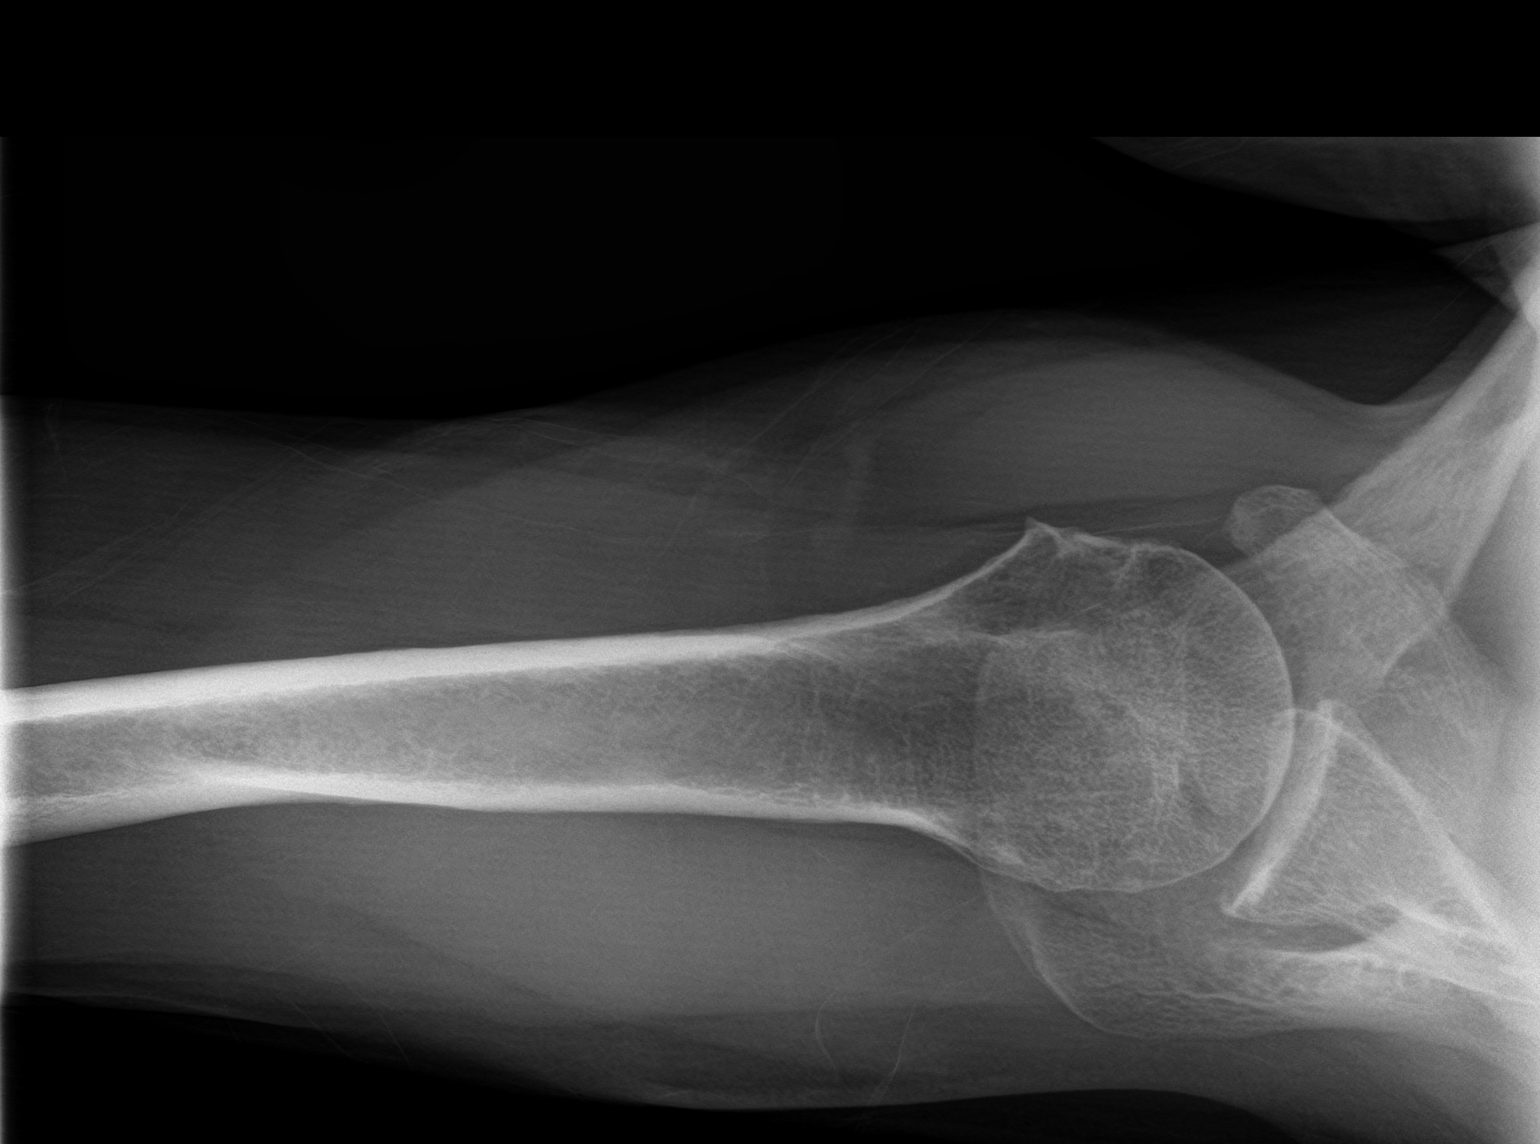

[3 of 3 positions shown; findings below may reference images not displayed]

FINDINGS: Degenerative changes in the AC joint with joint space narrowing and
spurring. Glenohumeral joint is maintained. No acute bony
abnormality. Specifically, no fracture, subluxation, or dislocation.
Soft tissues are intact.
IMPRESSION: Advanced degenerative changes in the left AC joint. No acute bony
abnormality.

## 2019-12-06 DIAGNOSIS — Z23 Encounter for immunization: Secondary | ICD-10-CM | POA: Diagnosis not present

## 2020-11-08 ENCOUNTER — Encounter: Payer: Self-pay | Admitting: Nurse Practitioner

## 2020-11-08 ENCOUNTER — Other Ambulatory Visit: Payer: Self-pay

## 2020-11-08 ENCOUNTER — Ambulatory Visit (INDEPENDENT_AMBULATORY_CARE_PROVIDER_SITE_OTHER): Payer: Medicare Other | Admitting: Nurse Practitioner

## 2020-11-08 VITALS — BP 128/70 | HR 70 | Temp 97.2°F | Resp 16 | Ht 77.0 in | Wt 221.0 lb

## 2020-11-08 DIAGNOSIS — M25511 Pain in right shoulder: Secondary | ICD-10-CM | POA: Diagnosis not present

## 2020-11-08 DIAGNOSIS — M542 Cervicalgia: Secondary | ICD-10-CM

## 2020-11-08 DIAGNOSIS — Z23 Encounter for immunization: Secondary | ICD-10-CM

## 2020-11-08 DIAGNOSIS — M25512 Pain in left shoulder: Secondary | ICD-10-CM

## 2020-11-08 DIAGNOSIS — L409 Psoriasis, unspecified: Secondary | ICD-10-CM | POA: Diagnosis not present

## 2020-11-08 DIAGNOSIS — G8929 Other chronic pain: Secondary | ICD-10-CM | POA: Diagnosis not present

## 2020-11-08 MED ORDER — PNEUMOCOCCAL 20-VAL CONJ VACC 0.5 ML IM SUSY: 0.5000 mL | PREFILLED_SYRINGE | INTRAMUSCULAR | 0 refills | Status: AC

## 2020-11-08 MED ORDER — ZOSTER VAC RECOMB ADJUVANTED 50 MCG/0.5ML IM SUSR: 0.5000 mL | Freq: Once | INTRAMUSCULAR | 0 refills | Status: AC

## 2020-11-08 MED ORDER — CLOBETASOL PROPIONATE 0.05 % EX OINT: 1.0000 "application " | TOPICAL_OINTMENT | Freq: Two times a day (BID) | CUTANEOUS | 2 refills | Status: DC | PRN

## 2020-11-08 MED ORDER — TIZANIDINE HCL 4 MG PO TABS: 4.0000 mg | ORAL_TABLET | Freq: Four times a day (QID) | ORAL | 0 refills | Status: DC | PRN

## 2020-11-08 NOTE — Progress Notes (Signed)
[facility] [address] [place], [place] [address]  Internal MEDICINE  Office Visit Note  Patient Name: Ryan Gallagher  [id number]  [id number]  Date of Service: 11/08/2020  Chief Complaint  Patient presents with   Acute Visit    Shoulder and neck pain   Rash    Spreading, itchy, back, butt, legs, pt noticed this about 4 years ago but feels like its getting worse, and seems worse in the summer     HPI Ryan Gallagher presents for an acute sick visit for a rash that he reports has been present for the past 4 years. He feels like it is getting worse. It seems to be spreading, and it is itchy. He reports that the rash is on the lower half of his back, flanks, hips, buttocks, thighs, abdomen, smaller areas on the lower legs as well. Seems worse during the summer time.  -No office visit encounters addressing the rash could be found from current or previous providers.  Also reports shoulder and neck pain, chronic- worse at night, having trouble sleeping.    Current Medication:  Outpatient Encounter Medications as of 11/08/2020  Medication Sig   aspirin-acetaminophen-caffeine (EXCEDRIN MIGRAINE) 250-250-65 MG per tablet Take 1 tablet by mouth every 6 (six) hours as needed for headache.   calcium carbonate (TUMS - DOSED IN MG ELEMENTAL CALCIUM) 500 MG chewable tablet Chew 1 tablet by mouth as needed for indigestion or heartburn.   clobetasol ointment (TEMOVATE) 0.05 % Apply 1 application topically 2 (two) times daily as needed (rash). Use as needed until the rash is gone.   diphenhydrAMINE HCl (BENADRYL ALLERGY PO) Take by mouth.   DIPHENHYDRAMINE HCL, TOPICAL, (BENADRYL ITCH STOPPING) 2 % GEL Apply topically.   Multiple Vitamins-Minerals (MULTIVITAMIN WITH MINERALS) tablet Take 1 tablet by mouth daily.   tiZANidine (ZANAFLEX) 4 MG tablet Take 1 tablet (4 mg total) by mouth every 6 (six) hours as needed for muscle spasms.   [DISCONTINUED] pneumococcal 20-Val Conj Vacc (PREVNAR 20)  0.5 ML injection Inject 0.5 mLs into the muscle tomorrow at 10 am.   [DISCONTINUED] Zoster Vaccine Adjuvanted [facility]) injection Inject 0.5 mLs into the muscle once.   [EXPIRED] pneumococcal 20-Val Conj Vacc (PREVNAR 20) 0.5 ML injection Inject 0.5 mLs into the muscle tomorrow at 10 am for 1 dose.   [EXPIRED] Zoster Vaccine Adjuvanted [facility]) injection Inject 0.5 mLs into the muscle once for 1 dose.   [DISCONTINUED] celecoxib (CELEBREX) 100 MG capsule Take 1 capsule (100 mg total) by mouth 2 (two) times daily. (Patient not taking: Reported on 11/08/2020)   [DISCONTINUED] Lidocaine (HM LIDOCAINE PATCH) 4 % PTCH Apply 1 patch topically daily as needed. (Patient not taking: Reported on 11/08/2020)   No facility-administered encounter medications on file as of 11/08/2020.      Medical History: Past Medical History:  Diagnosis Date   Arthritis    Diverticulitis    Enlarged prostate    GERD (gastroesophageal reflux disease)    OCC   Headache    MIGRAINES   History of hiatal hernia      Vital Signs: BP 128/70   Pulse 70   Temp (!) 97.2 F (36.2 C)   Resp 16   Ht 6\' 5"  (1.956 m)   Wt 221 lb (100.2 kg)   SpO2 98%   BMI 26.21 kg/m    Review of Systems  Constitutional:  Negative for chills, diaphoresis, fatigue, fever and unexpected weight change.  HENT: Negative.  Negative for congestion, rhinorrhea, sneezing  and sore throat.   Eyes:  Negative for redness.  Respiratory: Negative.  Negative for cough, chest tightness, shortness of breath and wheezing.   Cardiovascular: Negative.  Negative for chest pain and palpitations.  Gastrointestinal:  Negative for abdominal pain, constipation, diarrhea, nausea and vomiting.  Genitourinary:  Negative for dysuria and frequency.  Musculoskeletal:  Positive for arthralgias (chronic bilateral shoulder pain) and neck pain (chronic). Negative for back pain and joint swelling.  Skin:  Positive for rash (see HPI).  Neurological: Negative.   Negative for tremors and numbness.  Hematological:  Negative for adenopathy. Does not bruise/bleed easily.  Psychiatric/Behavioral:  Positive for sleep disturbance (due to neck and shoulder pain). Negative for behavioral problems (Depression), self-injury and suicidal ideas. The patient is not nervous/anxious.    Physical Exam Vitals reviewed.  Constitutional:      General: He is not in acute distress.    Appearance: Normal appearance. He is not ill-appearing.  HENT:     Head: Normocephalic and atraumatic.  Cardiovascular:     Rate and Rhythm: Normal rate and regular rhythm.     Pulses: Normal pulses.  Pulmonary:     Effort: Pulmonary effort is normal. No respiratory distress.  Skin:    General: Skin is warm and dry.     Capillary Refill: Capillary refill takes less than 2 seconds.     Findings: Rash present. Rash is macular, papular and scaling.     Comments: Large circular and oval-shaped overlapping raised plaques noted across the lower half of his back and wrapping around the sides of his torso. The edges of the plaques are the most raises areas with  the center being red and scaly. Smaller circular and oval-shaped plaques noted scattered across his abdomen. He also has some larger ones scattered across his buttocks and a few smaller ones on the inside of this thighs. There are a few small circular plaques on his lower legs near his ankles as well. These areas are itchy and if he scratches the areas they bleed easily.   Neurological:     Mental Status: He is alert and oriented to person, place, and time.      Assessment/Plan: 1. Psoriasis Clobetasol ointment prescribed. May use OTC benadryl for relief of itchiness. Dermatology referral ordered, patient may need further treatment interventions including biologicals.  - OTC diphenhydrAMINE HCl (BENADRYL ALLERGY PO); Take by mouth  25-50 mg every 6 hours as needed for itching - DIPHENHYDRAMINE HCL, TOPICAL, (BENADRYL ITCH STOPPING) 2  % GEL; Apply topically. - clobetasol ointment (TEMOVATE) 0.05 %; Apply 1 application topically 2 (two) times daily as needed (rash). Use as needed until the rash is gone.  Dispense: 60 g; Refill: 2 - Ambulatory referral to Dermatology  2. Neck pain, chronic Manageable during the day but has trouble sleeping at night because of it. Tizanidine prescribed.  - tiZANidine (ZANAFLEX) 4 MG tablet; Take 1 tablet (4 mg total) by mouth at bedtime.  Dispense: 30 tablet; Refill: 0  3. Chronic pain of both shoulders Manageable during the day but has trouble sleeping at night because of it. Tizanidine prescribed.  - tiZANidine (ZANAFLEX) 4 MG tablet; Take 1 tablet (4 mg total) by mouth at bedtime.  Dispense: 30 tablet; Refill: 0  4. Need for vaccination Orders sent to pharmacy - Zoster Vaccine Adjuvanted [facility]) injection; Inject 0.5 mLs into the muscle once for 1 dose.  Dispense: 0.5 mL; Refill: 0 - pneumococcal 20-Val Conj Vacc (PREVNAR 20) 0.5 ML injection; Inject 0.5  mLs into the muscle tomorrow at 10 am for 1 dose.  Dispense: 0.5 mL; Refill: 0   General Counseling: nthony lefferts understanding of the findings of todays visit and agrees with plan of treatment. I have discussed any further diagnostic evaluation that may be needed or ordered today. We also reviewed his medications today. he has been encouraged to call the office with any questions or concerns that should arise related to todays visit.    Counseling:    Orders Placed This Encounter  Procedures   Ambulatory referral to Dermatology    Meds ordered this encounter  Medications   Zoster Vaccine Adjuvanted [facility]) injection    Sig: Inject 0.5 mLs into the muscle once for 1 dose.    Dispense:  0.5 mL    Refill:  0   pneumococcal 20-Val Conj Vacc (PREVNAR 20) 0.5 ML injection    Sig: Inject 0.5 mLs into the muscle tomorrow at 10 am for 1 dose.    Dispense:  0.5 mL    Refill:  0   clobetasol ointment (TEMOVATE) 0.05 %     Sig: Apply 1 application topically 2 (two) times daily as needed (rash). Use as needed until the rash is gone.    Dispense:  60 g    Refill:  2   tiZANidine (ZANAFLEX) 4 MG tablet    Sig: Take 1 tablet (4 mg total) by mouth every 6 (six) hours as needed for muscle spasms.    Dispense:  30 tablet    Refill:  0    Return in about 1 month (around 12/09/2020) for F/U reassess skin and new med, Weldon Nouri PCP.  [place] Controlled Substance Database was reviewed by me for overdose risk score (ORS)  Time spent:20 Minutes Time spent with patient included reviewing progress notes, labs, imaging studies, and discussing plan for follow up.   This patient was seen by Sallyanne Kuster, FNP-C in collaboration with Dr. Beverely Risen as a part of collaborative care agreement.  Nikesha Kwasny R. Tedd Sias, MSN, FNP-C Internal Medicine

## 2020-11-14 MED ORDER — TIZANIDINE HCL 4 MG PO TABS: 4.0000 mg | ORAL_TABLET | Freq: Every day | ORAL | 0 refills | Status: DC

## 2020-11-26 ENCOUNTER — Encounter: Payer: Self-pay | Admitting: Internal Medicine

## 2020-11-26 ENCOUNTER — Inpatient Hospital Stay
Admission: EM | Admit: 2020-11-26 | Discharge: 2020-11-30 | DRG: 121 | Disposition: A | Discharge status: Home / Self Care | Payer: Medicare Other | Attending: Internal Medicine | Admitting: Internal Medicine

## 2020-11-26 ENCOUNTER — Other Ambulatory Visit: Payer: Self-pay

## 2020-11-26 ENCOUNTER — Emergency Department: Payer: Medicare Other

## 2020-11-26 DIAGNOSIS — M6088 Other myositis, other site: Secondary | ICD-10-CM | POA: Diagnosis not present

## 2020-11-26 DIAGNOSIS — Z20822 Contact with and (suspected) exposure to covid-19: Secondary | ICD-10-CM | POA: Diagnosis present

## 2020-11-26 DIAGNOSIS — H05012 Cellulitis of left orbit: Principal | ICD-10-CM

## 2020-11-26 DIAGNOSIS — F17210 Nicotine dependence, cigarettes, uncomplicated: Secondary | ICD-10-CM | POA: Diagnosis present

## 2020-11-26 DIAGNOSIS — J320 Chronic maxillary sinusitis: Secondary | ICD-10-CM | POA: Diagnosis not present

## 2020-11-26 DIAGNOSIS — N400 Enlarged prostate without lower urinary tract symptoms: Secondary | ICD-10-CM | POA: Diagnosis present

## 2020-11-26 DIAGNOSIS — H16032 Corneal ulcer with hypopyon, left eye: Secondary | ICD-10-CM | POA: Diagnosis present

## 2020-11-26 DIAGNOSIS — H168 Other keratitis: Secondary | ICD-10-CM | POA: Diagnosis present

## 2020-11-26 DIAGNOSIS — Z833 Family history of diabetes mellitus: Secondary | ICD-10-CM

## 2020-11-26 DIAGNOSIS — G43909 Migraine, unspecified, not intractable, without status migrainosus: Secondary | ICD-10-CM | POA: Diagnosis present

## 2020-11-26 DIAGNOSIS — H1820 Unspecified corneal edema: Secondary | ICD-10-CM | POA: Diagnosis not present

## 2020-11-26 DIAGNOSIS — J324 Chronic pansinusitis: Secondary | ICD-10-CM | POA: Diagnosis present

## 2020-11-26 DIAGNOSIS — B965 Pseudomonas (aeruginosa) (mallei) (pseudomallei) as the cause of diseases classified elsewhere: Secondary | ICD-10-CM | POA: Diagnosis present

## 2020-11-26 DIAGNOSIS — L03213 Periorbital cellulitis: Secondary | ICD-10-CM

## 2020-11-26 DIAGNOSIS — F17200 Nicotine dependence, unspecified, uncomplicated: Secondary | ICD-10-CM | POA: Diagnosis present

## 2020-11-26 DIAGNOSIS — R220 Localized swelling, mass and lump, head: Secondary | ICD-10-CM | POA: Diagnosis not present

## 2020-11-26 DIAGNOSIS — K219 Gastro-esophageal reflux disease without esophagitis: Secondary | ICD-10-CM | POA: Diagnosis present

## 2020-11-26 DIAGNOSIS — H16002 Unspecified corneal ulcer, left eye: Secondary | ICD-10-CM | POA: Diagnosis not present

## 2020-11-26 DIAGNOSIS — Z872 Personal history of diseases of the skin and subcutaneous tissue: Secondary | ICD-10-CM | POA: Diagnosis not present

## 2020-11-26 LAB — CBC WITH DIFFERENTIAL/PLATELET
Abs Immature Granulocytes: 0.06 10*3/uL (ref 0.00–0.07)
Basophils Absolute: 0.1 10*3/uL (ref 0.0–0.1)
Basophils Relative: 1 %
Eosinophils Absolute: 0 10*3/uL (ref 0.0–0.5)
Eosinophils Relative: 0 %
HCT: 46.5 % (ref 39.0–52.0)
Hemoglobin: 16.2 g/dL (ref 13.0–17.0)
Immature Granulocytes: 0 %
Lymphocytes Relative: 10 %
Lymphs Abs: 1.6 10*3/uL (ref 0.7–4.0)
MCH: 32.4 pg (ref 26.0–34.0)
MCHC: 34.8 g/dL (ref 30.0–36.0)
MCV: 93 fL (ref 80.0–100.0)
Monocytes Absolute: 1.2 10*3/uL — ABNORMAL HIGH (ref 0.1–1.0)
Monocytes Relative: 7 %
Neutro Abs: 13.1 10*3/uL — ABNORMAL HIGH (ref 1.7–7.7)
Neutrophils Relative %: 82 %
Platelets: 267 10*3/uL (ref 150–400)
RBC: 5 MIL/uL (ref 4.22–5.81)
RDW: 13 % (ref 11.5–15.5)
WBC: 16 10*3/uL — ABNORMAL HIGH (ref 4.0–10.5)
nRBC: 0 % (ref 0.0–0.2)

## 2020-11-26 LAB — BASIC METABOLIC PANEL
Anion gap: 9 (ref 5–15)
BUN: 11 mg/dL (ref 8–23)
CO2: 29 mmol/L (ref 22–32)
Calcium: 9.4 mg/dL (ref 8.9–10.3)
Chloride: 96 mmol/L — ABNORMAL LOW (ref 98–111)
Creatinine, Ser: 0.89 mg/dL (ref 0.61–1.24)
GFR, Estimated: >60 mL/min (ref >60)
Glucose, Bld: 120 mg/dL — ABNORMAL HIGH (ref 70–99)
Potassium: 4.3 mmol/L (ref 3.5–5.1)
Sodium: 134 mmol/L — ABNORMAL LOW (ref 135–145)

## 2020-11-26 LAB — RESP PANEL BY RT-PCR (FLU A&B, COVID) ARPGX2
Influenza A by PCR: NEGATIVE
Influenza B by PCR: NEGATIVE
SARS Coronavirus 2 by RT PCR: NEGATIVE

## 2020-11-26 MED ORDER — TIZANIDINE HCL 4 MG PO TABS
4.0000 mg | ORAL_TABLET | Freq: Every day | ORAL | Status: DC
  Administered 2020-11-26 – 2020-11-29 (×4): 4 mg via ORAL
  Filled 2020-11-26 (×6): qty 1

## 2020-11-26 MED ORDER — CALCIUM CARBONATE ANTACID 500 MG PO CHEW
1.0000 | CHEWABLE_TABLET | ORAL | Status: DC | PRN
  Filled 2020-11-26: qty 1

## 2020-11-26 MED ORDER — SODIUM CHLORIDE 0.9 % IV SOLN: INTRAVENOUS | Status: DC

## 2020-11-26 MED ORDER — SODIUM CHLORIDE 0.9 % IV SOLN: 2.0000 g | INTRAVENOUS | Status: DC

## 2020-11-26 MED ORDER — ASPIRIN-ACETAMINOPHEN-CAFFEINE 250-250-65 MG PO TABS
1.0000 | ORAL_TABLET | Freq: Four times a day (QID) | ORAL | Status: DC | PRN
  Administered 2020-11-27 – 2020-11-28 (×4): 1 via ORAL
  Filled 2020-11-26 (×8): qty 1

## 2020-11-26 MED ORDER — SODIUM CHLORIDE 0.9 % IV SOLN
1.0000 g | Freq: Once | INTRAVENOUS | Status: AC
  Administered 2020-11-26: 1 g via INTRAVENOUS
  Filled 2020-11-26: qty 10

## 2020-11-26 MED ORDER — TETRACAINE HCL 0.5 % OP SOLN
2.0000 [drp] | Freq: Once | OPHTHALMIC | Status: DC
  Filled 2020-11-26: qty 4

## 2020-11-26 MED ORDER — MORPHINE SULFATE (PF) 2 MG/ML IV SOLN
2.0000 mg | INTRAVENOUS | Status: DC | PRN
  Administered 2020-11-26 – 2020-11-28 (×6): 2 mg via INTRAVENOUS
  Filled 2020-11-26 (×6): qty 1

## 2020-11-26 MED ORDER — TOBRAMYCIN SULFATE 80 MG/2ML IJ SOLN
OPHTHALMIC | Status: DC
  Filled 2020-11-26 (×53): qty 0.1

## 2020-11-26 MED ORDER — ACETAMINOPHEN 650 MG RE SUPP: 650.0000 mg | Freq: Four times a day (QID) | RECTAL | Status: DC | PRN

## 2020-11-26 MED ORDER — VANCOMYCIN HCL 1500 MG/300ML IV SOLN
1500.0000 mg | Freq: Once | INTRAVENOUS | Status: AC
  Administered 2020-11-26: 1500 mg via INTRAVENOUS
  Filled 2020-11-26: qty 300

## 2020-11-26 MED ORDER — VANCOMYCIN HCL 1500 MG/300ML IV SOLN
1500.0000 mg | Freq: Two times a day (BID) | INTRAVENOUS | Status: DC
  Administered 2020-11-27 – 2020-11-29 (×5): 1500 mg via INTRAVENOUS
  Filled 2020-11-26 (×7): qty 300

## 2020-11-26 MED ORDER — SODIUM CHLORIDE 0.9 % IV SOLN
2.0000 g | INTRAVENOUS | Status: DC
  Administered 2020-11-27 – 2020-11-28 (×2): 2 g via INTRAVENOUS
  Filled 2020-11-26: qty 2
  Filled 2020-11-26: qty 20

## 2020-11-26 MED ORDER — NICOTINE 14 MG/24HR TD PT24
14.0000 mg | MEDICATED_PATCH | Freq: Every day | TRANSDERMAL | Status: DC
  Administered 2020-11-27 – 2020-11-30 (×4): 14 mg via TRANSDERMAL
  Filled 2020-11-26 (×5): qty 1

## 2020-11-26 MED ORDER — HYDROMORPHONE HCL 1 MG/ML IJ SOLN
1.0000 mg | Freq: Once | INTRAMUSCULAR | Status: AC
  Administered 2020-11-26: 1 mg via INTRAVENOUS
  Filled 2020-11-26: qty 1

## 2020-11-26 MED ORDER — IOHEXOL 350 MG/ML SOLN
75.0000 mL | Freq: Once | INTRAVENOUS | Status: AC | PRN
  Administered 2020-11-26: 75 mL via INTRAVENOUS
  Filled 2020-11-26: qty 75

## 2020-11-26 MED ORDER — ONDANSETRON HCL 4 MG/2ML IJ SOLN
4.0000 mg | Freq: Four times a day (QID) | INTRAMUSCULAR | Status: DC | PRN
  Administered 2020-11-26: 4 mg via INTRAVENOUS
  Filled 2020-11-26: qty 2

## 2020-11-26 MED ORDER — ONDANSETRON HCL 4 MG PO TABS
4.0000 mg | ORAL_TABLET | Freq: Four times a day (QID) | ORAL | Status: DC | PRN
  Administered 2020-11-28: 18:00:00 4 mg via ORAL
  Filled 2020-11-26: qty 1

## 2020-11-26 MED ORDER — ONDANSETRON HCL 4 MG/2ML IJ SOLN
4.0000 mg | Freq: Once | INTRAMUSCULAR | Status: AC
  Administered 2020-11-26: 4 mg via INTRAVENOUS
  Filled 2020-11-26: qty 2

## 2020-11-26 MED ORDER — ACETAMINOPHEN 325 MG PO TABS
650.0000 mg | ORAL_TABLET | Freq: Four times a day (QID) | ORAL | Status: DC | PRN
  Administered 2020-11-27: 18:00:00 650 mg via ORAL
  Filled 2020-11-26: qty 2

## 2020-11-26 MED ORDER — VANCOMYCIN HCL IN DEXTROSE 1-5 GM/200ML-% IV SOLN: 1000.0000 mg | Freq: Once | INTRAVENOUS | Status: DC

## 2020-11-26 MED ORDER — VANCOMYCIN HCL IN DEXTROSE 1-5 GM/200ML-% IV SOLN
1000.0000 mg | Freq: Once | INTRAVENOUS | Status: AC
  Administered 2020-11-26: 1000 mg via INTRAVENOUS
  Filled 2020-11-26: qty 200

## 2020-11-26 MED ORDER — ADULT MULTIVITAMIN W/MINERALS CH
1.0000 | ORAL_TABLET | Freq: Every day | ORAL | Status: DC
  Administered 2020-11-27 – 2020-11-30 (×4): 1 via ORAL
  Filled 2020-11-26 (×4): qty 1

## 2020-11-26 MED ORDER — MORPHINE SULFATE (PF) 4 MG/ML IV SOLN
4.0000 mg | Freq: Once | INTRAVENOUS | Status: AC
  Administered 2020-11-26: 4 mg via INTRAVENOUS
  Filled 2020-11-26: qty 1

## 2020-11-26 MED ORDER — NONFORMULARY OR COMPOUNDED ITEM
1.0000 [drp] | Status: DC
  Administered 2020-11-26 – 2020-11-28 (×21): 1 [drp] via OPHTHALMIC
  Filled 2020-11-26: qty 1

## 2020-11-26 MED ORDER — ENOXAPARIN SODIUM 40 MG/0.4ML IJ SOSY
40.0000 mg | PREFILLED_SYRINGE | INTRAMUSCULAR | Status: DC
  Administered 2020-11-26 – 2020-11-29 (×4): 40 mg via SUBCUTANEOUS
  Filled 2020-11-26 (×4): qty 0.4

## 2020-11-26 MED ORDER — FLUORESCEIN SODIUM 1 MG OP STRP
1.0000 | ORAL_STRIP | Freq: Once | OPHTHALMIC | Status: DC
  Filled 2020-11-26: qty 1

## 2020-11-26 NOTE — Consult Note (Signed)
PHARMACY -  BRIEF ANTIBIOTIC NOTE   Pharmacy has received consult(s) for Vancomycin from an ED provider.  The patient's profile has been reviewed for ht/wt/allergies/indication/available labs.    One time order(s) placed for Vancomycin  Further antibiotics/pharmacy consults should be ordered by admitting physician if indicated.                       Thank you, Britley Gashi Rodriguez-Guzman PharmD, BCPS 11/26/2020 4:12 PM

## 2020-11-26 NOTE — ED Provider Notes (Addendum)
[facility] Emergency Department Provider Note ____________________________________________  Time seen: Approximately 1:46 PM  I have reviewed the triage vital signs and the nursing notes.   HISTORY  Chief Complaint Eye Problem   HPI Ryan Gallagher is a 65 y.o. male presents to the emergency department for treatment and evaluation of left eye drainage and swelling.  Symptoms started about 4 days ago.  He does wear contacts but has not since symptoms started.  He has also had fever.  He reports recently having had a sinus infection but thought that that had resolved.  Past Medical History:  Diagnosis Date   Arthritis    Diverticulitis    Enlarged prostate    GERD (gastroesophageal reflux disease)    OCC   Headache    MIGRAINES   History of hiatal hernia     Patient Active Problem List   Diagnosis Date Noted   Orbital cellulitis on left 11/26/2020   BPH with obstruction/lower urinary tract symptoms 04/19/2018    Past Surgical History:  Procedure Laterality Date   HOLEP-LASER ENUCLEATION OF THE PROSTATE WITH MORCELLATION N/A 04/19/2018   Procedure: HOLEP-LASER ENUCLEATION OF THE PROSTATE WITH MORCELLATION;  Surgeon: Sondra Come, MD;  Location: [facility] ORS;  Service: Urology;  Laterality: N/A;   NO PAST SURGERIES      Prior to Admission medications   Medication Sig Start Date End Date Taking? Authorizing Provider  aspirin-acetaminophen-caffeine (EXCEDRIN MIGRAINE) [phone] MG per tablet Take 1 tablet by mouth every 6 (six) hours as needed for headache.    [provider]  calcium carbonate (TUMS - DOSED IN MG ELEMENTAL CALCIUM) 500 MG chewable tablet Chew 1 tablet by mouth as needed for indigestion or heartburn.    [provider]  clobetasol ointment (TEMOVATE) 0.05 % Apply 1 application topically 2 (two) times daily as needed (rash). Use as needed until the rash is gone. 11/08/20   Sallyanne Kuster, NP  diphenhydrAMINE HCl  (BENADRYL ALLERGY PO) Take by mouth.    [provider]  DIPHENHYDRAMINE HCL, TOPICAL, (BENADRYL ITCH STOPPING) 2 % GEL Apply topically.    [provider]  Multiple Vitamins-Minerals (MULTIVITAMIN WITH MINERALS) tablet Take 1 tablet by mouth daily.    [provider]  tiZANidine (ZANAFLEX) 4 MG tablet Take 1 tablet (4 mg total) by mouth at bedtime. 11/14/20   Sallyanne Kuster, NP    Allergies Patient has no known allergies.  Family History  Problem Relation Age of Onset   Alzheimer's disease Mother    Alzheimer's disease Father    Diabetes Paternal Uncle    Prostate cancer Neg Hx    Bladder Cancer Neg Hx    Kidney cancer Neg Hx     Social History Social History   Tobacco Use   Smoking status: Every Day    Packs/day: 0.50    Years: 50.00    Pack years: 25.00    Types: Cigarettes   Smokeless tobacco: Never  Substance Use Topics   Alcohol use: Yes    Comment: occ   Drug use: Never    Review of Systems   Constitutional: Positive for fever/chills Eyes: Positive for visual changes.  Positive for pain.  Positive for drainage. Musculoskeletal: Negative for pain. Skin: Negative for rash. Neurological: Positive for headaches, focal weakness or numbness. Allergic: Negative for seasonal allergies. ____________________________________________  PHYSICAL EXAM:  VITAL SIGNS: ED Triage Vitals  Enc Vitals Group     BP 11/26/20 1241 126/77     Pulse  Rate 11/26/20 1241 (!) 57     Resp 11/26/20 1241 18     Temp 11/26/20 1241 98.2 F (36.8 C)     Temp Source 11/26/20 1241 Oral     SpO2 11/26/20 1241 99 %     Weight 11/26/20 1241 217 lb (98.4 kg)     Height 11/26/20 1241 6\' 5"  (1.956 m)     Head Circumference --      Peak Flow --      Pain Score 11/26/20 1248 8     Pain Loc --      Pain Edu? --      Excl. in GC? --     Constitutional: Alert and oriented. Well appearing and in no acute distress. Eyes: Visual acuity--see nursing documentation;  no globe trauma; Eyelids normal to inspection; Sclera appears anicteric.  Eyelids not inverted. Conjunctiva appears erythematous; Cornea injected.  Copious purulent discharge left eye. Pain with EOM. Head: Atraumatic. Nose: No congestion/rhinnorhea. Mouth/Throat: Mucous membranes are moist.  Oropharynx non-erythematous. Respiratory: Respirations even and unlabored. Breath sounds clear to auscultation. Musculoskeletal:Normal ROM x 4 extremities. Neurologic:  Normal speech and language. No gross focal neurologic deficits are appreciated. Speech is normal. No gait instability. Skin:  Skin is warm, dry and intact. No rash noted. Psychiatric: Mood and affect are normal. Speech and behavior are normal.  ____________________________________________   LABS (all labs ordered are listed, but only abnormal results are displayed)  Labs Reviewed  BASIC METABOLIC PANEL - Abnormal; Notable for the following components:      Result Value   Sodium 134 (*)    Chloride 96 (*)    Glucose, Bld 120 (*)    All other components within normal limits  CBC WITH DIFFERENTIAL/PLATELET - Abnormal; Notable for the following components:   WBC 16.0 (*)    Neutro Abs 13.1 (*)    Monocytes Absolute 1.2 (*)    All other components within normal limits  RESP PANEL BY RT-PCR (FLU A&B, COVID) ARPGX2  HIV ANTIBODY (ROUTINE TESTING W REFLEX)   ____________________________________________  EKG  Not indicated ____________________________________________  RADIOLOGY  CT maxillofacial shows periorbital cellulitis as well as postseptal orbital cellulitis extending into the left medical rectus muscle. ____________________________________________   PROCEDURES  Procedure(s) performed: None ____________________________________________   INITIAL IMPRESSION / ASSESSMENT AND PLAN / ED COURSE  66 year old male presenting to the emergency department for treatment and evaluation of fever, headache, left eye swelling and  purulent drainage.  See HPI for further details.  Because patient has had a recent sinus infection, fever, and copious purulent drainage from the left plan will be to get labs and maxillofacial CT to evaluate for infection.  CT concerning for periorbital, postseptal orbital cellulitis as well as paranasal sinus disease. Plan will be to admit via hospitalist service and consult with ophthalmology.  Accepted for admission. Dr. 79 plans to come evaluate patient after office hours.  ----------------------------------------- 6:10 PM on 11/26/2020 ----------------------------------------- Dr. 01/26/2021 has examined the patient and has collected specimens which were handcarried to the lab.  Fortified tobramycin and vancomycin drops requested from the pharmacy.  CRITICAL CARE Performed by: Druscilla Brownie   Total critical care time: 45 minutes  Critical care time was exclusive of separately billable procedures and treating other patients.  Critical care was necessary to treat or prevent imminent or life-threatening deterioration.  Critical care was time spent personally by me on the following activities: development of treatment plan with patient and/or surrogate as well as nursing, discussions with  consultants, evaluation of patient's response to treatment, examination of patient, obtaining history from patient or surrogate, ordering and performing treatments and interventions, ordering and review of laboratory studies, ordering and review of radiographic studies, pulse oximetry and re-evaluation of patient's condition.   Pertinent labs & imaging results that were available during my care of the patient were reviewed by me and considered in my medical decision making (see chart for details). ____________________________________________   FINAL CLINICAL IMPRESSION(S) / ED DIAGNOSES  Final diagnoses:  Orbital cellulitis on left  Pansinusitis, unspecified chronicity  Periorbital  cellulitis of left eye    Note:  This document was prepared using Dragon voice recognition software and may include unintentional dictation errors.    Chinita Pester, FNP 11/26/20 1811    Chinita Pester, FNP 11/26/20 1815    Jene Every, MD 11/26/20 [place] Dragon    Jene Every, MD 11/26/20 1900

## 2020-11-26 NOTE — ED Triage Notes (Signed)
Pt states that he has contacts took it out a few days ago because his left eye was hurting, he has copious amount of purulent drainage coming out of his left eye. Urgent care sent pt her to be evaluated.

## 2020-11-26 NOTE — ED Triage Notes (Signed)
Pt here with left eye drainage and infection. Pt was send here from [facility] due to pt having a fever for 4 days and a swollen left eye with matting to the point that he cannot open his eyelid. Pt does not recall what started this issue besides a cold.

## 2020-11-26 NOTE — Consult Note (Signed)
Subjective: Three days ago began with pain and blurred vision OS. Removed contact which had been in the eye for 4 or more days. Routinely sleeps in contacts. Today increased pain and purulent discharge. Also lid swelling. No problems with right eye , still wearing contact.  Well water at home. Also frequently mows a cow pasture.  Objective: Vital signs in last 24 hours: Temp:  [98.2 F (36.8 C)] 98.2 F (36.8 C) (09/02 1546) Pulse Rate:  [48-57] 48 (09/02 1630) Resp:  [12-19] 12 (09/02 1630) BP: (126-139)/(77-88) 139/81 (09/02 1630) SpO2:  [92 %-99 %] 92 % (09/02 1630) Weight:  [98.4 kg] 98.4 kg (09/02 1248)    Objective:  NVA OD with SCL 20/30                   OS light perception   Anterior segment OD SCL in place. Quiet eye.         OS Lids matted together with heavy purulence. There is mild to mod lid edema. After gentle debridement with sterile water the cornea was examined. There is a large ulcer spanning 8-9 mm of the cornea with a dense ring infiltrate , cornea edema, and 15% hypopyon.   A slide was made with a sterile Beaver blade. Three cultures were placed on blood, chocolate and agar. These are presently being sent to the [facility] lab.  Vigamox was placed.  Posterior segment not possible to examine in OS.  I personally reviewed the CT. There is some evidence of preseptal cellulitis and myositis.  A repeat CT orbits may be warranted in a couple of days.  Recent Labs    11/26/20 1404  WBC 16.0*  HGB 16.2  HCT 46.5  NA 134*  K 4.3  CL 96*  CO2 29  BUN 11  CREATININE 0.89    Studies/Results: CT Maxillofacial W Contrast  Result Date: 11/26/2020 CLINICAL DATA:  Provided history: Cellulitis, face, periorbital edema; purulent drainage left eye. Patient presents with left eye drainage and infection. EXAM: CT MAXILLOFACIAL WITH CONTRAST TECHNIQUE: Multidetector CT imaging of the maxillofacial structures was performed with intravenous contrast. Multiplanar CT image  reconstructions were also generated. CONTRAST:  68mL OMNIPAQUE IOHEXOL 350 MG/ML SOLN COMPARISON:  No pertinent prior exams available for comparison. FINDINGS: Osseous: No maxillofacial fracture. Orbits: Left periorbital soft tissue swelling, enhancement and induration compatible with periorbital cellulitis. There is subtle asymmetric enhancement of the left medial rectus muscle (series 2, image 25) (series 4, image 34). The globes are normal in size and contour. The optic nerve sheath complexes are symmetric and unremarkable. Sinuses: Complete opacification of the right maxillary sinus this could reflect odontogenic sinusitis as there is periapical lucency surrounding the right maxillary first molar. Partial opacification of the anterior right ethmoid air cells. Trace mucosal thickening within the left ethmoid air cells. Trace mucosal thickening along the floor of the left maxillary sinus. This could also reflect odontogenic sinusitis as there is periapical lucency surrounding multiple left maxillary molars. Soft tissues: Left periorbital soft tissue swelling, enhancement and induration compatible with periorbital cellulitis as described above. The visualized maxillofacial and upper neck soft tissues are otherwise unremarkable. Limited intracranial: No evidence of acute intracranial abnormality within the field of view. IMPRESSION: Left periorbital soft tissue swelling, enhancement and induration compatible with periorbital cellulitis. Additionally, there is subtle asymmetric enhancement of the left medial rectus muscle suspicious for early myositis and postseptal orbital extension of infection (orbital cellulitis). Paranasal sinus disease, most notably severe right maxillary sinusitis. Bilateral maxillary  sinusitis may be odontogenic in origin as there is periapical lucency surrounding bilateral maxillary molars. Electronically Signed   By: Jackey Loge D.O.   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 Possible organisms include Pseudomonas spp. Amoeba, E.coli....Marland KitchenMarland KitchenCornea appears intact thus far. I discussed the gravity of this infection with patient and wife, including loss of vision, loss of eye, need of corneal surgery, likelihood of significant scar even if the infection become adequately controlled. Long course of therapy also discussed.  If perforation occurs, emergent transfer to [facility] for enucleation cannot be ruled out.  NOW: vigamox placed. Wife will place vigamox q 3o minutes until fortified Vanc and Tobra arrive from the [facility] pharmacy.  These will be placed q one hour alternating around the clock.  I will return tomorrow to see the patient , and likely each day until discharge.   I will have lab results called to my cell phone as they become available.     He will then need very close follow up as an out patient at the [facility]. I gave them my card.  LOS: 0 days   Galen Manila 9/2/20226:09 PM

## 2020-11-26 NOTE — Progress Notes (Signed)
Pharmacy Antibiotic Note  Ryan Gallagher is a 65 y.o. male admitted on 11/26/2020 with cellulitis.  Pharmacy has been consulted for Vancomycin dosing. Patient with corneal ulcer and orbital cellulitis. Opthalmalogy is following.  Plan: Vancomycin 2500 mg IV loading dose followed by Vancomycin 1500 mg IV Q 12 hrs. Goal AUC 400-550. Expected AUC: 477.7 SCr used: 0.89 Expected Cmin: 12.7 mcg/ml   Height: 6\' 5"  (195.6 cm) Weight: 98.4 kg (217 lb) IBW/kg (Calculated) : 89.1  Temp (24hrs), Avg:98.2 F (36.8 C), Min:98.2 F (36.8 C), Max:98.2 F (36.8 C)  Recent Labs  Lab 11/26/20 1404  WBC 16.0*  CREATININE 0.89    Estimated Creatinine Clearance: 101.5 mL/min (by C-G formula based on SCr of 0.89 mg/dL).    No Known Allergies  Antimicrobials this admission: Ceftriaxone 9/2 >>  Vancomycin 9/2 >>  Tobramycin Fortified Eye Drops 9/2 >> Vancomycin Fortified Eye Drops 9/2 >>  Dose adjustments this admission:  Microbiology results:   Thank you for allowing pharmacy to be a part of this patient's care.  11/2, PharmD, BCPS 11/26/2020 8:17 PM

## 2020-11-26 NOTE — H&P (Signed)
History and Physical    [name] CVE:[id number] DOB: September 12, 1955 DOA: 11/26/2020  PCP: Lyndon Code, MD   Patient coming from: Home  I have personally briefly reviewed patient's old medical records in [facility]  Chief Complaint: Left eye pain with purulent drainage  HPI: Ryan Gallagher is a 65 y.o. male with medical history significant for GERD, migraine headaches, hiatal hernia and nicotine dependence who presents to the emergency room from the urgent care center for evaluation of a 4-day history of worsening pain involving his left eye with purulent drainage and impaired vision. Patient states that he usually wears contact lenses and about 4 days ago felt his left eye was irritated and so he took out his contacts.  He noticed his left eye was red and irritated but over the last 2 days he developed pain in his left eye and the evening before his presentation his vision became impaired in that left eye.  He is only able to tell if it is day or night but did not see anything else out of that left eye.  On the day of his admission he woke up with significant swelling involving the left eye and was unable to open that eye.  He had a lot of purulent drainage and crust he tried to clean out.  He complains of severe headaches and rates his headache and eye pain an 8 x 10 intensity at its worst. He denies having any fever or chills, no chest pain, no shortness of breath, no dizziness, no lightheadedness, no abdominal pain, no nausea, no vomiting, no urinary symptoms, no dizziness or lightheadedness. Labs show sodium 134, potassium 4.3, chloride 96, bicarb 29, glucose 120, BUN 11, creatinine 0.89, calcium 9.4, white count 16.0, hemoglobin 16.2, hematocrit 46.5, MCV 93.0, RDW 13.0, platelet count 267 Maxillofacial CT shows left periorbital soft tissue swelling, enhancement and induration compatible with periorbital cellulitis. Additionally, there is low subtle asymmetric enhancement of the  left medial rectus muscle suspicious for early myositis and postseptal orbital extension of infection (orbital cellulitis). Paranasal sinus disease, most notably severe right maxillary sinusitis. Bilateral maxillary sinusitis may be odontogenic in origin as there is periapical lucency surrounding bilateral maxillary molars.    ED Course: Patient is a 65 year old male who was referred to the emergency room from the urgent care center where he went for evaluation of left eye pain, swelling, impaired vision and purulent drainage. Imaging is suggestive of periorbital cellulitis with post septal orbital extension of infection. Patient was discussed with ophthalmologist, Dr Druscilla Brownie who recommends admission to the hospital and treatment with IV antibiotics.  He will see the patient in consultation.     Review of Systems: As per HPI otherwise all other systems reviewed and negative.    Past Medical History:  Diagnosis Date   Arthritis    Diverticulitis    Enlarged prostate    GERD (gastroesophageal reflux disease)    OCC   Headache    MIGRAINES   History of hiatal hernia     Past Surgical History:  Procedure Laterality Date   HOLEP-LASER ENUCLEATION OF THE PROSTATE WITH MORCELLATION N/A 04/19/2018   Procedure: HOLEP-LASER ENUCLEATION OF THE PROSTATE WITH MORCELLATION;  Surgeon: Sondra Come, MD;  Location: [facility] ORS;  Service: Urology;  Laterality: N/A;   NO PAST SURGERIES       reports that he has been smoking cigarettes. He has a 25.00 pack-year smoking history. He has never used smokeless tobacco. He reports current  alcohol use. He reports that he does not use drugs.  No Known Allergies  Family History  Problem Relation Age of Onset   Alzheimer's disease Mother    Alzheimer's disease Father    Diabetes Paternal Uncle    Prostate cancer Neg Hx    Bladder Cancer Neg Hx    Kidney cancer Neg Hx       Prior to Admission medications   Medication Sig Start Date End Date  Taking? Authorizing Provider  aspirin-acetaminophen-caffeine (EXCEDRIN MIGRAINE) [phone] MG per tablet Take 1 tablet by mouth every 6 (six) hours as needed for headache.    [provider]  calcium carbonate (TUMS - DOSED IN MG ELEMENTAL CALCIUM) 500 MG chewable tablet Chew 1 tablet by mouth as needed for indigestion or heartburn.    [provider]  clobetasol ointment (TEMOVATE) 0.05 % Apply 1 application topically 2 (two) times daily as needed (rash). Use as needed until the rash is gone. 11/08/20   Sallyanne Kuster, NP  diphenhydrAMINE HCl (BENADRYL ALLERGY PO) Take by mouth.    [provider]  DIPHENHYDRAMINE HCL, TOPICAL, (BENADRYL ITCH STOPPING) 2 % GEL Apply topically.    [provider]  Multiple Vitamins-Minerals (MULTIVITAMIN WITH MINERALS) tablet Take 1 tablet by mouth daily.    [provider]  tiZANidine (ZANAFLEX) 4 MG tablet Take 1 tablet (4 mg total) by mouth at bedtime. 11/14/20   Sallyanne Kuster, NP    Physical Exam: Vitals:   11/26/20 1241 11/26/20 1248 11/26/20 1546  BP: 126/77  136/88  Pulse: (!) 57  (!) 52  Resp: 18  19  Temp: 98.2 F (36.8 C)  98.2 F (36.8 C)  TempSrc: Oral  Oral  SpO2: 99%  98%  Weight: 98.4 kg 98.4 kg   Height: 6\' 5"  (1.956 m) 6\' 5"  (1.956 m)      Vitals:   11/26/20 1241 11/26/20 1248 11/26/20 1546  BP: 126/77  136/88  Pulse: (!) 57  (!) 52  Resp: 18  19  Temp: 98.2 F (36.8 C)  98.2 F (36.8 C)  TempSrc: Oral  Oral  SpO2: 99%  98%  Weight: 98.4 kg 98.4 kg   Height: 6\' 5"  (1.956 m) 6\' 5"  (1.956 m)       Constitutional: Alert and oriented x 3 .  Appears uncomfortable and in painful distress  HEENT:      Head: Normocephalic and atraumatic.         Eyes: PERLA, EOMI, left eyes swollen with purulent drainage      Mouth/Throat: Mucous membranes are moist.       Neck: Supple with no signs of meningismus. Cardiovascular: Regular rate and rhythm. No murmurs, gallops, or rubs. 2+  symmetrical distal pulses are present . No JVD. No LE edema Respiratory: Respiratory effort normal .Lungs sounds clear bilaterally. No wheezes, crackles, or rhonchi.  Gastrointestinal: Soft, non tender, and non distended with positive bowel sounds.  Genitourinary: No CVA tenderness. Musculoskeletal: Nontender with normal range of motion in all extremities. No cyanosis, or erythema of extremities. Neurologic:  Face is symmetric. Moving all extremities. No gross focal neurologic deficits . Skin: Skin is warm, dry. rash on both lower extremities, back and groin  Psychiatric: Mood and affect are normal    Labs on Admission: I have personally reviewed following labs and imaging studies  CBC: Recent Labs  Lab 11/26/20 1404  WBC 16.0*  NEUTROABS 13.1*  HGB 16.2  HCT 46.5  MCV 93.0  PLT 267  Basic Metabolic Panel: Recent Labs  Lab 11/26/20 1404  NA 134*  K 4.3  CL 96*  CO2 29  GLUCOSE 120*  BUN 11  CREATININE 0.89  CALCIUM 9.4   GFR: Estimated Creatinine Clearance: 101.5 mL/min (by C-G formula based on SCr of 0.89 mg/dL). Liver Function Tests: No results for input(s): AST, ALT, ALKPHOS, BILITOT, PROT, ALBUMIN in the last 168 hours. No results for input(s): LIPASE, AMYLASE in the last 168 hours. No results for input(s): AMMONIA in the last 168 hours. Coagulation Profile: No results for input(s): INR, PROTIME in the last 168 hours. Cardiac Enzymes: No results for input(s): CKTOTAL, CKMB, CKMBINDEX, TROPONINI in the last 168 hours. BNP (last 3 results) No results for input(s): PROBNP in the last 8760 hours. HbA1C: No results for input(s): HGBA1C in the last 72 hours. CBG: No results for input(s): GLUCAP in the last 168 hours. Lipid Profile: No results for input(s): CHOL, HDL, LDLCALC, TRIG, CHOLHDL, LDLDIRECT in the last 72 hours. Thyroid Function Tests: No results for input(s): TSH, T4TOTAL, FREET4, T3FREE, THYROIDAB in the last 72 hours. Anemia Panel: No results for  input(s): VITAMINB12, FOLATE, FERRITIN, TIBC, IRON, RETICCTPCT in the last 72 hours. Urine analysis:    Component Value Date/Time   COLORURINE YELLOW (A) 03/23/2018 2348   APPEARANCEUR Clear 04/12/2018 0855   LABSPEC 1.009 03/23/2018 2348   PHURINE 6.0 03/23/2018 2348   GLUCOSEU Negative 04/12/2018 0855   HGBUR MODERATE (A) 03/23/2018 2348   BILIRUBINUR Negative 04/12/2018 0855   KETONESUR NEGATIVE 03/23/2018 2348   PROTEINUR Negative 04/12/2018 0855   PROTEINUR NEGATIVE 03/23/2018 2348   NITRITE Negative 04/12/2018 0855   NITRITE NEGATIVE 03/23/2018 2348   LEUKOCYTESUR Negative 04/12/2018 0855    Radiological Exams on Admission: CT Maxillofacial W Contrast  Result Date: 11/26/2020 CLINICAL DATA:  Provided history: Cellulitis, face, periorbital edema; purulent drainage left eye. Patient presents with left eye drainage and infection. EXAM: CT MAXILLOFACIAL WITH CONTRAST TECHNIQUE: Multidetector CT imaging of the maxillofacial structures was performed with intravenous contrast. Multiplanar CT image reconstructions were also generated. CONTRAST:  55mL OMNIPAQUE IOHEXOL 350 MG/ML SOLN COMPARISON:  No pertinent prior exams available for comparison. FINDINGS: Osseous: No maxillofacial fracture. Orbits: Left periorbital soft tissue swelling, enhancement and induration compatible with periorbital cellulitis. There is subtle asymmetric enhancement of the left medial rectus muscle (series 2, image 25) (series 4, image 34). The globes are normal in size and contour. The optic nerve sheath complexes are symmetric and unremarkable. Sinuses: Complete opacification of the right maxillary sinus this could reflect odontogenic sinusitis as there is periapical lucency surrounding the right maxillary first molar. Partial opacification of the anterior right ethmoid air cells. Trace mucosal thickening within the left ethmoid air cells. Trace mucosal thickening along the floor of the left maxillary sinus. This could  also reflect odontogenic sinusitis as there is periapical lucency surrounding multiple left maxillary molars. Soft tissues: Left periorbital soft tissue swelling, enhancement and induration compatible with periorbital cellulitis as described above. The visualized maxillofacial and upper neck soft tissues are otherwise unremarkable. Limited intracranial: No evidence of acute intracranial abnormality within the field of view. IMPRESSION: Left periorbital soft tissue swelling, enhancement and induration compatible with periorbital cellulitis. Additionally, there is subtle asymmetric enhancement of the left medial rectus muscle suspicious for early myositis and postseptal orbital extension of infection (orbital cellulitis). Paranasal sinus disease, most notably severe right maxillary sinusitis. Bilateral maxillary sinusitis may be odontogenic in origin as there is periapical lucency surrounding bilateral maxillary molars. Electronically  Signed   By: Jackey LogeKyle  Golden D.O.   On: 11/26/2020 15:28     Assessment/Plan Principal Problem:   Orbital cellulitis on left Active Problems:   Nicotine dependence      Left orbital cellulitis Patient presents to the ER for evaluation of a 4-day history of worsening pain, redness, swelling, impaired vision and purulent drainage from his left eye. He usually wears contact lenses but took them out about 4 days prior to his admission Imaging shows left periorbital soft tissue swelling, enhancement and induration compatible with periorbital cellulitis.  Additionally there is subtle asymmetric enhancement of the left medial rectus muscle suspicious for early myositis and preseptal orbital extension of infection. Will place patient empirically on IV vancomycin and Rocephin Ophthalmology consult has been requested Pain control    Nicotine dependence Smoking cessation has been discussed with patient in detail Will place patient on a nicotine transdermal patch 14 mg  daily   DVT prophylaxis: Lovenox  Code Status: full code  Family Communication: Greater than 50% of time was spent discussing plan of care with patient at the bedside.  All questions and concerns have been addressed.  He verbalizes understanding and agrees to the plan. Disposition Plan: Back to previous home environment Consults called: Ophthalmology Status: At the time of admission, it appears that the appropriate admission status for this patient is inpatient. This is judged to be reasonable and necessary in order to provide the required intensity of service to ensure the patient's safety given the presenting symptoms, physical exam findings, and initial radiographic and laboratory data in the context of their comorbid conditions. Patient requires inpatient status due to high intensity of service, high risk of further deterioration and high frequency of surveillance required.    Lucile Shuttersochukwu Ezri Landers MD [facility]     11/26/2020, 4:34 PM

## 2020-11-26 NOTE — ED Notes (Signed)
Message sent to provider via secure chat regarding Tobramycin eye, need clarification on how many gtts for each application. Await response.

## 2020-11-26 NOTE — ED Notes (Signed)
RN spoke with pharmacy, tobramycin is 1gtt left eye every 2 hours.

## 2020-11-26 NOTE — Consult Note (Signed)
Pharmacy Antibiotic Note  Ryan AMRHEIN is a 65 y.o. male admitted on 11/26/2020 with cellulitis.  Pharmacy has been consulted for vancomycin dosing.  Plan: Patient will be given a LD of vanco of 25mg /kg=2500mg  (1X1000mg  +1X1500mg =2500mg  dose).  Further dosing to be determined based on pt's admission status.  Height: 6\' 5"  (195.6 cm) Weight: 98.4 kg (217 lb) IBW/kg (Calculated) : 89.1  Temp (24hrs), Avg:98.2 F (36.8 C), Min:98.2 F (36.8 C), Max:98.2 F (36.8 C)  Recent Labs  Lab 11/26/20 1404  WBC 16.0*  CREATININE 0.89    Estimated Creatinine Clearance: 101.5 mL/min (by C-G formula based on SCr of 0.89 mg/dL).    No Known Allergies  Antimicrobials this admission: 09/02 vancomycin 2500mg  LD >>  09/02 ceftriaxone 1gm >>   Thank you for allowing pharmacy to be a part of this patient's care.  11/02 11/26/2020 3:51 PM

## 2020-11-27 LAB — BASIC METABOLIC PANEL
Anion gap: 7 (ref 5–15)
BUN: 12 mg/dL (ref 8–23)
CO2: 26 mmol/L (ref 22–32)
Calcium: 8.7 mg/dL — ABNORMAL LOW (ref 8.9–10.3)
Chloride: 101 mmol/L (ref 98–111)
Creatinine, Ser: 0.97 mg/dL (ref 0.61–1.24)
GFR, Estimated: >60 mL/min (ref >60)
Glucose, Bld: 117 mg/dL — ABNORMAL HIGH (ref 70–99)
Potassium: 4.2 mmol/L (ref 3.5–5.1)
Sodium: 134 mmol/L — ABNORMAL LOW (ref 135–145)

## 2020-11-27 LAB — CBC
HCT: 41.4 % (ref 39.0–52.0)
Hemoglobin: 14.4 g/dL (ref 13.0–17.0)
MCH: 32.7 pg (ref 26.0–34.0)
MCHC: 34.8 g/dL (ref 30.0–36.0)
MCV: 94.1 fL (ref 80.0–100.0)
Platelets: 221 10*3/uL (ref 150–400)
RBC: 4.4 MIL/uL (ref 4.22–5.81)
RDW: 12.8 % (ref 11.5–15.5)
WBC: 12.7 10*3/uL — ABNORMAL HIGH (ref 4.0–10.5)
nRBC: 0 % (ref 0.0–0.2)

## 2020-11-27 LAB — HIV ANTIBODY (ROUTINE TESTING W REFLEX): HIV Screen 4th Generation wRfx: NONREACTIVE

## 2020-11-27 MED ORDER — MAGNESIUM GLUCONATE 500 (27 MG) MG PO TABS
500.0000 mg | ORAL_TABLET | Freq: Every day | ORAL | Status: DC
  Administered 2020-11-27 – 2020-11-30 (×4): 500 mg via ORAL
  Filled 2020-11-27 (×6): qty 1

## 2020-11-27 MED ORDER — MAGNESIUM GLUCONATE 500 (27 MG) MG PO TABS
500.0000 mg | ORAL_TABLET | Freq: Every day | ORAL | Status: DC
  Filled 2020-11-27: qty 1

## 2020-11-27 MED ORDER — VITAMIN B-12 100 MCG PO TABS
100.0000 ug | ORAL_TABLET | Freq: Every day | ORAL | Status: DC
  Administered 2020-11-27 – 2020-11-30 (×4): 100 ug via ORAL
  Filled 2020-11-27 (×4): qty 1

## 2020-11-27 NOTE — Progress Notes (Signed)
Ryan Gallagher  CWC:[id number] DOB: 1955/09/19 DOA: 11/26/2020 PCP: Lyndon Code, MD    Brief Narrative:  66 year old with a history of GERD/HH, migraines, and tobacco abuse who presented to the ER with a 4-day history of worsening left eye pain with associated purulent drainage and impaired vision.  4 days prior to his admission he felt an irritation in his left eye and therefore removed his contact lenses.  His left eye gradually became more and more red and irritated.  2 days prior to his admission he developed severe pain in the left eye in the evening before his presentation his vision in the eye became significantly impaired.  In the ED maxillofacial CT noted left periorbital soft tissue swelling as well as subtle asymmetric enhancement of the left medial rectus muscle worrisome for early myositis and post septal orbital extension of infection.  Severe right maxillary sinusitis was also noted.  Consultants:  Ophthalmology  Code Status: FULL CODE  Antimicrobials:  Ceftriaxone 9/2 > Vancomycin 9/2 > Tobramycin ophthalmic drops 9/2 > Vancomycin ophthalmic drops 9/2 >  DVT prophylaxis: Lovenox  Subjective: No acute events appreciated overnight.  Afebrile with stable vitals.  Reports ongoing pain in his left arm but states it has improved.  Reports swelling about the left eye has improved significantly.  Denies chest pain or shortness of breath.  Assessment & Plan:  Periorbital cellulitis -early myositis left medial rectus muscle with postseptal orbital extension of infection - severe contact lens associated corneal ulcer OS with hypopyon Direct antibiotic care directed by ophthalmology -continue systemic antibiotic as well -high risk of vision loss and even orbital rupture  GERD Well-controlled at present  Migraine headaches Modest ongoing headache but not consistent with migraine   Family Communication: Spoke with wife at bedside Status is: Inpatient  Remains inpatient  appropriate because:Inpatient level of care appropriate due to severity of illness  Dispo: The patient is from: Home              Anticipated d/c is to: Home              Patient currently is not medically stable to d/c.   Difficult to place patient No  Objective: Blood pressure 118/70, pulse (!) 55, temperature 98.1 F (36.7 C), temperature source Oral, resp. rate 16, height 6\' 5"  (1.956 m), weight 98.4 kg, SpO2 99 %.  Intake/Output Summary (Last 24 hours) at 11/27/2020 1030 Last data filed at 11/27/2020 1025 Gross per 24 hour  Intake 794.71 ml  Output 650 ml  Net 144.71 ml   Filed Weights   11/26/20 1241 11/26/20 1248  Weight: 98.4 kg 98.4 kg    Examination: General: No acute respiratory distress Lungs: Clear to auscultation bilaterally without wheezes or crackles Cardiovascular: Regular rate and rhythm without murmur gallop or rub normal S1 and S2 Abdomen: Nontender, nondistended, soft, bowel sounds positive, no rebound, no ascites, no appreciable mass Extremities: No significant cyanosis, clubbing, or edema bilateral lower extremities  CBC: Recent Labs  Lab 11/26/20 1404 11/27/20 0600  WBC 16.0* 12.7*  NEUTROABS 13.1*  --   HGB 16.2 14.4  HCT 46.5 41.4  MCV 93.0 94.1  PLT 267 221   Basic Metabolic Panel: Recent Labs  Lab 11/26/20 1404 11/27/20 0600  NA 134* 134*  K 4.3 4.2  CL 96* 101  CO2 29 26  GLUCOSE 120* 117*  BUN 11 12  CREATININE 0.89 0.97  CALCIUM 9.4 8.7*   GFR: Estimated Creatinine Clearance: 93.1 mL/min (by  C-G formula based on SCr of 0.97 mg/dL).    Recent Results (from the past 240 hour(s))  Resp Panel by RT-PCR (Flu A&B, Covid) Nasopharyngeal Swab     Status: None   Collection Time: 11/26/20  4:25 PM   Specimen: Nasopharyngeal Swab; Nasopharyngeal(NP) swabs in vial transport medium  Result Value Ref Range Status   SARS Coronavirus 2 by RT PCR NEGATIVE NEGATIVE Final    Comment: (NOTE) SARS-CoV-2 target nucleic acids are NOT  DETECTED.  The SARS-CoV-2 RNA is generally detectable in upper respiratory specimens during the acute phase of infection. The lowest concentration of SARS-CoV-2 viral copies this assay can detect is 138 copies/mL. A negative result does not preclude SARS-Cov-2 infection and should not be used as the sole basis for treatment or other patient management decisions. A negative result may occur with  improper specimen collection/handling, submission of specimen other than nasopharyngeal swab, presence of viral mutation(s) within the areas targeted by this assay, and inadequate number of viral copies(<138 copies/mL). A negative result must be combined with clinical observations, patient history, and epidemiological information. The expected result is Negative.  Fact Sheet for Patients:  BloggerCourse.com  Fact Sheet for Healthcare Providers:  SeriousBroker.it  This test is no t yet approved or cleared by the [place] FDA and  has been authorized for detection and/or diagnosis of SARS-CoV-2 by FDA under an Emergency Use Authorization (EUA). This EUA will remain  in effect (meaning this test can be used) for the duration of the COVID-19 declaration under Section 564(b)(1) of the Act, 21 U.S.C.section 360bbb-3(b)(1), unless the authorization is terminated  or revoked sooner.       Influenza A by PCR NEGATIVE NEGATIVE Final   Influenza B by PCR NEGATIVE NEGATIVE Final    Comment: (NOTE) The Xpert Xpress SARS-CoV-2/FLU/RSV plus assay is intended as an aid in the diagnosis of influenza from Nasopharyngeal swab specimens and should not be used as a sole basis for treatment. Nasal washings and aspirates are unacceptable for Xpert Xpress SARS-CoV-2/FLU/RSV testing.  Fact Sheet for Patients: BloggerCourse.com  Fact Sheet for Healthcare Providers: SeriousBroker.it  This test is not yet  approved or cleared by the [place] FDA and has been authorized for detection and/or diagnosis of SARS-CoV-2 by FDA under an Emergency Use Authorization (EUA). This EUA will remain in effect (meaning this test can be used) for the duration of the COVID-19 declaration under Section 564(b)(1) of the Act, 21 U.S.C. section 360bbb-3(b)(1), unless the authorization is terminated or revoked.  Performed at [facility], [address] [place], [place] [address]   Aerobic Culture w Gram Stain (superficial specimen)     Status: None (Preliminary result)   Collection Time: 11/26/20  6:10 PM   Specimen: Cornea  Result Value Ref Range Status   Specimen Description   Final    CORNEA Performed at [facility], [address] [place], [place] [address]    Special Requests   Final    NONE Performed at [facility], [address] [place], [place] [address]    Gram Stain   Final    ABUNDANT WBC PRESENT, PREDOMINANTLY PMN NO ORGANISMS SEEN Performed at [facility], [address] [place], [place] [address]    Culture PENDING  Incomplete   Report Status PENDING  Incomplete     Scheduled Meds:  enoxaparin (LOVENOX) injection  40 mg Subcutaneous Q24H   multivitamin with minerals  1 tablet Oral Daily   nicotine  14 mg Transdermal Daily   tiZANidine  4 mg Oral QHS   Vancomycin 25mg /ml (Fortified) Eye Drops  1 drop Left Eye Q2H   Continuous Infusions:  sodium chloride 100 mL/hr at 11/26/20 1719   cefTRIAXone (ROCEPHIN)  IV 2 g (11/27/20 1023)   Tobramycin 15mg /ml (Fortified) Eye Drops Stopped (11/27/20 2201)   vancomycin Stopped (11/27/20 1014)     LOS: 1 day   2202, MD [facility] Office  [phone] Pager - Text Page per Amion  If 7PM-7AM, please contact night-coverage per Amion 11/27/2020, 10:30 AM

## 2020-11-27 NOTE — ED Notes (Signed)
Informed RN bed assigned 

## 2020-11-27 NOTE — Progress Notes (Signed)
Patient ID: Ryan Gallagher, male   DOB: 04-10-1955, 65 y.o.   MRN: [id number]   Subjective: Pt sleeping when I arrived.  States that he is feeling a little better.  Objective: Vital signs in last 24 hours: Temp:  [98.1 F (36.7 C)-98.3 F (36.8 C)] 98.1 F (36.7 C) (09/03 2595) Pulse Rate:  [45-57] 55 (09/03 0632) Resp:  [12-19] 16 (09/03 6387) BP: (115-139)/(64-88) 118/70 (09/03 5643) SpO2:  [92 %-99 %] 99 % (09/03 3295) Weight:  [98.4 kg] 98.4 kg (09/02 1248)    Objective: OS is light perception only.                   Large ulcer as before, possibly healing edges of the involved area.  Cloudy cornea, anterior chamber appears formed.    15% hypopyon settled laterally due to sleeping position.   Continued 3-4 plus injection and chemosis. Motility intact. Decreased discharge and matter.  Recent Labs    11/26/20 1404 11/27/20 0600  WBC 16.0* 12.7*  HGB 16.2 14.4  HCT 46.5 41.4  NA 134* 134*  K 4.3 4.2  CL 96* 101  CO2 29 26  BUN 11 12  CREATININE 0.89 0.97    Studies/Results: CT Maxillofacial W Contrast  Result Date: 11/26/2020 CLINICAL DATA:  Provided history: Cellulitis, face, periorbital edema; purulent drainage left eye. Patient presents with left eye drainage and infection. EXAM: CT MAXILLOFACIAL WITH CONTRAST TECHNIQUE: Multidetector CT imaging of the maxillofacial structures was performed with intravenous contrast. Multiplanar CT image reconstructions were also generated. CONTRAST:  64mL OMNIPAQUE IOHEXOL 350 MG/ML SOLN COMPARISON:  No pertinent prior exams available for comparison. FINDINGS: Osseous: No maxillofacial fracture. Orbits: Left periorbital soft tissue swelling, enhancement and induration compatible with periorbital cellulitis. There is subtle asymmetric enhancement of the left medial rectus muscle (series 2, image 25) (series 4, image 34). The globes are normal in size and contour. The optic nerve sheath complexes are symmetric and unremarkable.  Sinuses: Complete opacification of the right maxillary sinus this could reflect odontogenic sinusitis as there is periapical lucency surrounding the right maxillary first molar. Partial opacification of the anterior right ethmoid air cells. Trace mucosal thickening within the left ethmoid air cells. Trace mucosal thickening along the floor of the left maxillary sinus. This could also reflect odontogenic sinusitis as there is periapical lucency surrounding multiple left maxillary molars. Soft tissues: Left periorbital soft tissue swelling, enhancement and induration compatible with periorbital cellulitis as described above. The visualized maxillofacial and upper neck soft tissues are otherwise unremarkable. Limited intracranial: No evidence of acute intracranial abnormality within the field of view. IMPRESSION: Left periorbital soft tissue swelling, enhancement and induration compatible with periorbital cellulitis. Additionally, there is subtle asymmetric enhancement of the left medial rectus muscle suspicious for early myositis and postseptal orbital extension of infection (orbital cellulitis). Paranasal sinus disease, most notably severe right maxillary sinusitis. Bilateral maxillary sinusitis may be odontogenic in origin as there is periapical lucency surrounding bilateral maxillary molars. Electronically Signed   By: Jackey Loge D.O.   On: 11/26/2020 15:28    Medications: I have reviewed the patient's current medications.  Assessment/Plan: Awaiting culture results. Microscopy showed only PMNs.  Continue fortified antibiotic drops q one hour alternating between Vanc and Tobra.  Continue pain control per admitting MD.  I will reevaluate Mr [facility] frequently.  LOS: 1 day   Galen Manila 9/3/20229:25 AM

## 2020-11-27 NOTE — Progress Notes (Signed)
Patient arrived to unit from ER in stable condition. Ambulated with steady gait

## 2020-11-27 NOTE — ED Notes (Signed)
Pt has no complaints at this time. Resting comfortably. Less discharge from left eye than noted yesterday at 1900.

## 2020-11-28 LAB — CBC
HCT: 42.3 % (ref 39.0–52.0)
Hemoglobin: 14.7 g/dL (ref 13.0–17.0)
MCH: 32.4 pg (ref 26.0–34.0)
MCHC: 34.8 g/dL (ref 30.0–36.0)
MCV: 93.2 fL (ref 80.0–100.0)
Platelets: 211 10*3/uL (ref 150–400)
RBC: 4.54 MIL/uL (ref 4.22–5.81)
RDW: 12.7 % (ref 11.5–15.5)
WBC: 7.7 10*3/uL (ref 4.0–10.5)
nRBC: 0 % (ref 0.0–0.2)

## 2020-11-28 MED ORDER — BUTALBITAL-APAP-CAFFEINE 50-325-40 MG PO TABS
1.0000 | ORAL_TABLET | Freq: Four times a day (QID) | ORAL | Status: DC | PRN
  Administered 2020-11-29 – 2020-11-30 (×4): 2 via ORAL
  Filled 2020-11-28 (×4): qty 2

## 2020-11-28 MED ORDER — SENNOSIDES-DOCUSATE SODIUM 8.6-50 MG PO TABS
1.0000 | ORAL_TABLET | Freq: Two times a day (BID) | ORAL | Status: DC
  Administered 2020-11-28 – 2020-11-29 (×3): 1 via ORAL
  Filled 2020-11-28 (×3): qty 1

## 2020-11-28 MED ORDER — CIPROFLOXACIN HCL 500 MG PO TABS
750.0000 mg | ORAL_TABLET | Freq: Two times a day (BID) | ORAL | Status: DC
  Administered 2020-11-28 – 2020-11-30 (×5): 750 mg via ORAL
  Filled 2020-11-28 (×5): qty 2

## 2020-11-28 MED ORDER — ATROPINE SULFATE 1 % OP SOLN
1.0000 [drp] | Freq: Two times a day (BID) | OPHTHALMIC | Status: DC
  Administered 2020-11-28 – 2020-11-30 (×5): 1 [drp] via OPHTHALMIC
  Filled 2020-11-28: qty 2

## 2020-11-28 MED ORDER — MOXIFLOXACIN HCL 0.5 % OP SOLN
1.0000 [drp] | OPHTHALMIC | Status: DC
  Administered 2020-11-28 – 2020-11-30 (×44): 1 [drp] via OPHTHALMIC
  Filled 2020-11-28: qty 3

## 2020-11-28 MED ORDER — POLYETHYLENE GLYCOL 3350 17 G PO PACK
17.0000 g | PACK | Freq: Every day | ORAL | Status: DC
  Administered 2020-11-28 – 2020-11-30 (×3): 17 g via ORAL
  Filled 2020-11-28 (×3): qty 1

## 2020-11-28 MED ORDER — OXYCODONE HCL 5 MG PO TABS
5.0000 mg | ORAL_TABLET | ORAL | Status: DC | PRN
  Administered 2020-11-28 – 2020-11-29 (×3): 10 mg via ORAL
  Administered 2020-11-30: 03:00:00 5 mg via ORAL
  Filled 2020-11-28: qty 2
  Filled 2020-11-28: qty 1
  Filled 2020-11-28 (×2): qty 2

## 2020-11-28 MED ORDER — SACCHAROMYCES BOULARDII 250 MG PO CAPS
250.0000 mg | ORAL_CAPSULE | Freq: Two times a day (BID) | ORAL | Status: DC
  Administered 2020-11-28 – 2020-11-30 (×4): 250 mg via ORAL
  Filled 2020-11-28 (×5): qty 1

## 2020-11-28 MED ORDER — MORPHINE SULFATE (PF) 2 MG/ML IV SOLN
2.0000 mg | INTRAVENOUS | Status: DC | PRN
Start: 2020-11-28 — End: 2020-11-30
  Administered 2020-11-28 – 2020-11-29 (×4): 2 mg via INTRAVENOUS
  Filled 2020-11-28 (×4): qty 1

## 2020-11-28 MED ORDER — TRAMADOL HCL 50 MG PO TABS
50.0000 mg | ORAL_TABLET | Freq: Four times a day (QID) | ORAL | Status: DC | PRN
  Administered 2020-11-30: 50 mg via ORAL
  Filled 2020-11-28: qty 1

## 2020-11-28 NOTE — Progress Notes (Signed)
Patient ID: Ryan Gallagher, male   DOB: 1956-01-07, 65 y.o.   MRN: [id number] Subjective: Increeased pain and head ache last night. Doing better today with altered pain management.  Objective: Vital signs in last 24 hours: Temp:  [98 F (36.7 C)-98.7 F (37.1 C)] 98.7 F (37.1 C) (09/04 0714) Pulse Rate:  [49-60] 52 (09/04 0714) Resp:  [15-18] 18 (09/04 0714) BP: (98-123)/(66-85) 118/79 (09/04 0714) SpO2:  [94 %-100 %] 98 % (09/04 0714)    Objective: VA is bare hand motion OS                   IOP = 15                   No discharge.                   Decreased lid swelling.                   No pain on versions. 3-4 plus injection but resolved chemosis. Corneal ulcer unchanged. Still 8-9 mm. Not quite to limbus. Some uniform stromal thinning.  AC formed. 15% hypopyon.  Recent Labs    11/26/20 1404 11/27/20 0600 11/28/20 0405  WBC 16.0* 12.7* 7.7  HGB 16.2 14.4 14.7  HCT 46.5 41.4 42.3  NA 134* 134*  --   K 4.3 4.2  --   CL 96* 101  --   CO2 29 26  --   BUN 11 12  --   CREATININE 0.89 0.97  --     Studies/Results: CT Maxillofacial W Contrast  Result Date: 11/26/2020 CLINICAL DATA:  Provided history: Cellulitis, face, periorbital edema; purulent drainage left eye. Patient presents with left eye drainage and infection. EXAM: CT MAXILLOFACIAL WITH CONTRAST TECHNIQUE: Multidetector CT imaging of the maxillofacial structures was performed with intravenous contrast. Multiplanar CT image reconstructions were also generated. CONTRAST:  54mL OMNIPAQUE IOHEXOL 350 MG/ML SOLN COMPARISON:  No pertinent prior exams available for comparison. FINDINGS: Osseous: No maxillofacial fracture. Orbits: Left periorbital soft tissue swelling, enhancement and induration compatible with periorbital cellulitis. There is subtle asymmetric enhancement of the left medial rectus muscle (series 2, image 25) (series 4, image 34). The globes are normal in size and contour. The optic nerve sheath  complexes are symmetric and unremarkable. Sinuses: Complete opacification of the right maxillary sinus this could reflect odontogenic sinusitis as there is periapical lucency surrounding the right maxillary first molar. Partial opacification of the anterior right ethmoid air cells. Trace mucosal thickening within the left ethmoid air cells. Trace mucosal thickening along the floor of the left maxillary sinus. This could also reflect odontogenic sinusitis as there is periapical lucency surrounding multiple left maxillary molars. Soft tissues: Left periorbital soft tissue swelling, enhancement and induration compatible with periorbital cellulitis as described above. The visualized maxillofacial and upper neck soft tissues are otherwise unremarkable. Limited intracranial: No evidence of acute intracranial abnormality within the field of view. IMPRESSION: Left periorbital soft tissue swelling, enhancement and induration compatible with periorbital cellulitis. Additionally, there is subtle asymmetric enhancement of the left medial rectus muscle suspicious for early myositis and postseptal orbital extension of infection (orbital cellulitis). Paranasal sinus disease, most notably severe right maxillary sinusitis. Bilateral maxillary sinusitis may be odontogenic in origin as there is periapical lucency surrounding bilateral maxillary molars. Electronically Signed   By: Jackey Loge D.O.   On: 11/26/2020 15:28    Medications: I have reviewed the patient's current medications.  Assessment/Plan: Severe  vision threatening / organ threatening Bacterial keratitis OS. Slow improvement. Awaiting cultures. If culture negative and any worsening , corneal biopsy may be warranted.  This would have to be done at a tertiary medical center such as [facility].  1) add atropine 1% gtt BID OS 2 Add Ciprofloxacin 750 mg PO BID. 3) Medical team to manage pain control but report to me if increased pain or sudden decrease in vision.  I  will examine [name] daily.  LOS: 2 days   Galen Manila 9/4/202211:15 AM

## 2020-11-28 NOTE — Progress Notes (Addendum)
Ryan Gallagher  GEZ:[id number] DOB: 1955-08-15 DOA: 11/26/2020 PCP: Lyndon Code, MD    Brief Narrative:  65 year old with a history of GERD/HH, migraines, and tobacco abuse who presented to the ER with a 4-day history of worsening left eye pain with associated purulent drainage and impaired vision.  4 days prior to his admission he felt an irritation in his left eye and therefore removed his contact lenses.  His left eye gradually became more and more red and irritated.  2 days prior to his admission he developed severe pain in the left eye in the evening before his presentation his vision in the eye became significantly impaired.  In the ED maxillofacial CT noted left periorbital soft tissue swelling as well as subtle asymmetric enhancement of the left medial rectus muscle worrisome for early myositis and post septal orbital extension of infection.  Severe right maxillary sinusitis was also noted.  Consultants:  Ophthalmology  Code Status: FULL CODE  Antimicrobials:  Ceftriaxone 9/2 > Vancomycin 9/2 > Tobramycin ophthalmic drops 9/2 > Vancomycin ophthalmic drops 9/2 >  DVT prophylaxis: Lovenox  Subjective: No acute events recorded overnight.  Afebrile.  Vital signs stable.  WBC has normalized.  At the time of my visit the patient reports development of a migraine headache this morning.  He also feels that pain in his left eye has worsened a bit this morning.  He denies chest pain or shortness of breath.  Assessment & Plan:  Pseudomonas keratitis OS Direct antibiotic care directed by Ophthalmology  Periorbital cellulitis - early myositis left medial rectus muscle with postseptal orbital extension of infection Clinically this portion of his illness is improving rapidly w/ systemic abx - will consider stopping IV Vanc 9/4  GERD Well-controlled at present  Migraine headaches HA worsening this morning - pain medications adjusted    Family Communication: Spoke with wife at  bedside Status is: Inpatient  Remains inpatient appropriate because:Inpatient level of care appropriate due to severity of illness  Dispo: The patient is from: Home              Anticipated d/c is to: Home              Patient currently is not medically stable to d/c.   Difficult to place patient No  Objective: Blood pressure 118/79, pulse (!) 52, temperature 98.7 F (37.1 C), resp. rate 18, height 6\' 5"  (1.956 m), weight 98.4 kg, SpO2 98 %.  Intake/Output Summary (Last 24 hours) at 11/28/2020 0809 Last data filed at 11/28/2020 0111 Gross per 24 hour  Intake 2490.04 ml  Output 1550 ml  Net 940.04 ml    Filed Weights   11/26/20 1241 11/26/20 1248  Weight: 98.4 kg 98.4 kg    Examination: General: No acute respiratory distress Lungs: CTA B without wheezing Cardiovascular: RRR without murmur or rub HEENT: Significantly decreased swelling about the left eye with patient now able to open his lids with no erythema of the face or overlying cutaneous wounds  CBC: Recent Labs  Lab 11/26/20 1404 11/27/20 0600 11/28/20 0405  WBC 16.0* 12.7* 7.7  NEUTROABS 13.1*  --   --   HGB 16.2 14.4 14.7  HCT 46.5 41.4 42.3  MCV 93.0 94.1 93.2  PLT 267 221 211    Basic Metabolic Panel: Recent Labs  Lab 11/26/20 1404 11/27/20 0600  NA 134* 134*  K 4.3 4.2  CL 96* 101  CO2 29 26  GLUCOSE 120* 117*  BUN 11 12  CREATININE 0.89 0.97  CALCIUM 9.4 8.7*    GFR: Estimated Creatinine Clearance: 93.1 mL/min (by C-G formula based on SCr of 0.97 mg/dL).    Recent Results (from the past 240 hour(s))  Resp Panel by RT-PCR (Flu A&B, Covid) Nasopharyngeal Swab     Status: None   Collection Time: 11/26/20  4:25 PM   Specimen: Nasopharyngeal Swab; Nasopharyngeal(NP) swabs in vial transport medium  Result Value Ref Range Status   SARS Coronavirus 2 by RT PCR NEGATIVE NEGATIVE Final    Comment: (NOTE) SARS-CoV-2 target nucleic acids are NOT DETECTED.  The SARS-CoV-2 RNA is generally  detectable in upper respiratory specimens during the acute phase of infection. The lowest concentration of SARS-CoV-2 viral copies this assay can detect is 138 copies/mL. A negative result does not preclude SARS-Cov-2 infection and should not be used as the sole basis for treatment or other patient management decisions. A negative result may occur with  improper specimen collection/handling, submission of specimen other than nasopharyngeal swab, presence of viral mutation(s) within the areas targeted by this assay, and inadequate number of viral copies(<138 copies/mL). A negative result must be combined with clinical observations, patient history, and epidemiological information. The expected result is Negative.  Fact Sheet for Patients:  BloggerCourse.com  Fact Sheet for Healthcare Providers:  SeriousBroker.it  This test is no t yet approved or cleared by the [place] FDA and  has been authorized for detection and/or diagnosis of SARS-CoV-2 by FDA under an Emergency Use Authorization (EUA). This EUA will remain  in effect (meaning this test can be used) for the duration of the COVID-19 declaration under Section 564(b)(1) of the Act, 21 U.S.C.section 360bbb-3(b)(1), unless the authorization is terminated  or revoked sooner.       Influenza A by PCR NEGATIVE NEGATIVE Final   Influenza B by PCR NEGATIVE NEGATIVE Final    Comment: (NOTE) The Xpert Xpress SARS-CoV-2/FLU/RSV plus assay is intended as an aid in the diagnosis of influenza from Nasopharyngeal swab specimens and should not be used as a sole basis for treatment. Nasal washings and aspirates are unacceptable for Xpert Xpress SARS-CoV-2/FLU/RSV testing.  Fact Sheet for Patients: BloggerCourse.com  Fact Sheet for Healthcare Providers: SeriousBroker.it  This test is not yet approved or cleared by the [place] FDA  and has been authorized for detection and/or diagnosis of SARS-CoV-2 by FDA under an Emergency Use Authorization (EUA). This EUA will remain in effect (meaning this test can be used) for the duration of the COVID-19 declaration under Section 564(b)(1) of the Act, 21 U.S.C. section 360bbb-3(b)(1), unless the authorization is terminated or revoked.  Performed at [facility], [address], [place], [place] [address]   Aerobic Culture w Gram Stain (superficial specimen)     Status: None (Preliminary result)   Collection Time: 11/26/20  6:10 PM   Specimen: Cornea  Result Value Ref Range Status   Specimen Description   Final    CORNEA Performed at [facility], [address], [place], [place] [address]    Special Requests   Final    NONE Performed at [facility], [address] [place], [place] [address]    Gram Stain   Final    ABUNDANT WBC PRESENT, PREDOMINANTLY PMN NO ORGANISMS SEEN    Culture   Final    NO GROWTH < 12 HOURS Performed at [facility], [address] [place], [place] [address]    Report Status PENDING  Incomplete  Scheduled Meds:  enoxaparin (LOVENOX) injection  40 mg Subcutaneous Q24H   Magnesium Gluconate  500 mg Oral Q0600   multivitamin with minerals  1 tablet Oral Daily   nicotine  14 mg Transdermal Daily   tiZANidine  4 mg Oral QHS   Vancomycin 25mg /ml (Fortified) Eye Drops  1 drop Left Eye Q2H   vitamin B-12  100 mcg Oral Daily   Continuous Infusions:  cefTRIAXone (ROCEPHIN)  IV 2 g (11/28/20 0757)   Tobramycin 15mg /ml (Fortified) Eye Drops     vancomycin Stopped (11/28/20 0111)     LOS: 2 days   , MD [facility] Office  [phone] Pager - Text Page per Lonia Blood  If 7PM-7AM, please contact night-coverage per Amion 11/28/2020, 8:09 AM

## 2020-11-29 NOTE — Progress Notes (Signed)
Pharmacy Antibiotic Note  NICOLES Gallagher is a 65 y.o. male admitted on 11/26/2020 with cellulitis.  Pharmacy has been consulted for Vancomycin dosing. Patient with corneal ulcer and orbital cellulitis. Opthalmalogy is following.  Plan: Vancomycin 2500 mg IV loading dose followed by Vancomycin 1500 mg IV Q 12 hrs. Goal AUC 400-550. Expected AUC: 477.7 SCr used: 0.89 Expected Cmin: 12.7 mcg/ml  9/5:  Vanc doses were off original schedule so retimed peak and trough to correspond with new doses.  Next Vanc 1500 mg on 9/5 @ 1200. Vanc peak on 9/5 @ 1500. Vanc trough on 9/5 @ 2330.    Height: 6\' 5"  (195.6 cm) Weight: 98.4 kg (217 lb) IBW/kg (Calculated) : 89.1  Temp (24hrs), Avg:98.3 F (36.8 C), Min:98 F (36.7 C), Max:98.7 F (37.1 C)  Recent Labs  Lab 11/26/20 1404 11/27/20 0600 11/28/20 0405  WBC 16.0* 12.7* 7.7  CREATININE 0.89 0.97  --      Estimated Creatinine Clearance: 93.1 mL/min (by C-G formula based on SCr of 0.97 mg/dL).    No Known Allergies  Antimicrobials this admission: Ceftriaxone 9/2 >>  Vancomycin 9/2 >>  Tobramycin Fortified Eye Drops 9/2 >> Vancomycin Fortified Eye Drops 9/2 >>  Dose adjustments this admission:  Microbiology results:   Thank you for allowing pharmacy to be a part of this patient's care.  11/2, PharmD, BCPS 11/29/2020 4:35 AM

## 2020-11-29 NOTE — Progress Notes (Signed)
Ryan Gallagher Sens  VOJ:[id number] DOB: February 07, 1956 DOA: 11/26/2020 PCP: Lyndon Code, MD    Brief Narrative:  65 year old with a history of GERD/HH, migraines, and tobacco abuse who presented to the ER with a 4-day history of worsening left eye pain with associated purulent drainage and impaired vision.  4 days prior to his admission he felt an irritation in his left eye and therefore removed his contact lenses.  His left eye gradually became more and more red and irritated.  2 days prior to his admission he developed severe pain in the left eye in the evening before his presentation his vision in the eye became significantly impaired.  In the ED maxillofacial CT noted left periorbital soft tissue swelling as well as subtle asymmetric enhancement of the left medial rectus muscle worrisome for early myositis and post septal orbital extension of infection.  Severe right maxillary sinusitis was also noted.  Consultants:  Ophthalmology  Code Status: FULL CODE  Antimicrobials:  Ceftriaxone 9/2 > Vancomycin 9/2 > Tobramycin ophthalmic drops 9/2 > Vancomycin ophthalmic drops 9/2 >  DVT prophylaxis: Lovenox  Subjective: No acute events reported overnight.  Afebrile.  Vital signs stable. Feels much better today, with signif decrease in eye pain noted. No other complaints at present.   Assessment & Plan:  Pseudomonas keratitis OS Ophthalmic antibiotic care directed by Ophthalmology -culture from cornea now revealing Pseudomonas aeruginosa - appears to be improving - possible d/c home 9/6 at discretion of Ophthalmology   Periorbital cellulitis - early myositis left medial rectus muscle with postseptal orbital extension of infection Clinically this portion of his illness has improved rapidly w/ systemic abx - stop IV Vanc  GERD Well-controlled   Migraine headaches HA well controlled at this time    Family Communication: no family present at time of exam Status is: Inpatient  Remains  inpatient appropriate because:Inpatient level of care appropriate due to severity of illness  Dispo: The patient is from: Home              Anticipated d/c is to: Home              Patient currently is not medically stable to d/c.   Difficult to place patient No  Objective: Blood pressure 124/70, pulse 64, temperature 98.6 F (37 C), resp. rate 16, height 6\' 5"  (1.956 m), weight 98.4 kg, SpO2 96 %.  Intake/Output Summary (Last 24 hours) at 11/29/2020 0902 Last data filed at 11/28/2020 1035 Gross per 24 hour  Intake 240 ml  Output --  Net 240 ml    Filed Weights   11/26/20 1241 11/26/20 1248  Weight: 98.4 kg 98.4 kg    Examination: General: No acute respiratory distress Lungs: CTA B  Cardiovascular: RRR without murmur  HEENT: Nearly resolved swelling about the left eye - no erythema of face/periorbital area   CBC: Recent Labs  Lab 11/26/20 1404 11/27/20 0600 11/28/20 0405  WBC 16.0* 12.7* 7.7  NEUTROABS 13.1*  --   --   HGB 16.2 14.4 14.7  HCT 46.5 41.4 42.3  MCV 93.0 94.1 93.2  PLT 267 221 211    Basic Metabolic Panel: Recent Labs  Lab 11/26/20 1404 11/27/20 0600  NA 134* 134*  K 4.3 4.2  CL 96* 101  CO2 29 26  GLUCOSE 120* 117*  BUN 11 12  CREATININE 0.89 0.97  CALCIUM 9.4 8.7*    GFR: Estimated Creatinine Clearance: 93.1 mL/min (by C-G formula based on SCr of 0.97 mg/dL).  Recent Results (from the past 240 hour(s))  Resp Panel by RT-PCR (Flu A&B, Covid) Nasopharyngeal Swab     Status: None   Collection Time: 11/26/20  4:25 PM   Specimen: Nasopharyngeal Swab; Nasopharyngeal(NP) swabs in vial transport medium  Result Value Ref Range Status   SARS Coronavirus 2 by RT PCR NEGATIVE NEGATIVE Final    Comment: (NOTE) SARS-CoV-2 target nucleic acids are NOT DETECTED.  The SARS-CoV-2 RNA is generally detectable in upper respiratory specimens during the acute phase of infection. The lowest concentration of SARS-CoV-2 viral copies this assay can detect  is 138 copies/mL. A negative result does not preclude SARS-Cov-2 infection and should not be used as the sole basis for treatment or other patient management decisions. A negative result may occur with  improper specimen collection/handling, submission of specimen other than nasopharyngeal swab, presence of viral mutation(s) within the areas targeted by this assay, and inadequate number of viral copies(<138 copies/mL). A negative result must be combined with clinical observations, patient history, and epidemiological information. The expected result is Negative.  Fact Sheet for Patients:  BloggerCourse.com  Fact Sheet for Healthcare Providers:  SeriousBroker.it  This test is no t yet approved or cleared by the [place] FDA and  has been authorized for detection and/or diagnosis of SARS-CoV-2 by FDA under an Emergency Use Authorization (EUA). This EUA will remain  in effect (meaning this test can be used) for the duration of the COVID-19 declaration under Section 564(b)(1) of the Act, 21 U.S.C.section 360bbb-3(b)(1), unless the authorization is terminated  or revoked sooner.       Influenza A by PCR NEGATIVE NEGATIVE Final   Influenza B by PCR NEGATIVE NEGATIVE Final    Comment: (NOTE) The Xpert Xpress SARS-CoV-2/FLU/RSV plus assay is intended as an aid in the diagnosis of influenza from Nasopharyngeal swab specimens and should not be used as a sole basis for treatment. Nasal washings and aspirates are unacceptable for Xpert Xpress SARS-CoV-2/FLU/RSV testing.  Fact Sheet for Patients: BloggerCourse.com  Fact Sheet for Healthcare Providers: SeriousBroker.it  This test is not yet approved or cleared by the [place] FDA and has been authorized for detection and/or diagnosis of SARS-CoV-2 by FDA under an Emergency Use Authorization (EUA). This EUA will remain in effect  (meaning this test can be used) for the duration of the COVID-19 declaration under Section 564(b)(1) of the Act, 21 U.S.C. section 360bbb-3(b)(1), unless the authorization is terminated or revoked.  Performed at [facility], [address], [place], [place] [address]   Aerobic Culture w Gram Stain (superficial specimen)     Status: None (Preliminary result)   Collection Time: 11/26/20  6:10 PM   Specimen: Cornea  Result Value Ref Range Status   Specimen Description   Final    CORNEA Performed at [facility], [address] [place], [place] [address]    Special Requests   Final    NONE Performed at [facility], [address] [place], [place] [address]    Gram Stain   Final    ABUNDANT WBC PRESENT, PREDOMINANTLY PMN NO ORGANISMS SEEN    Culture   Final    RARE PSEUDOMONAS AERUGINOSA CULTURE REINCUBATED FOR BETTER GROWTH RESULT CALLED TO, READ BACK BY AND VERIFIED WITH: RN T.ROBINSON ON [id number] AT 1328 BY E.PARRISH Performed at [facility], [address], [place], [place] [address]    Report Status PENDING  Incomplete      Scheduled Meds:  atropine  1  drop Left Eye BID   ciprofloxacin  750 mg Oral BID   enoxaparin (LOVENOX) injection  40 mg Subcutaneous Q24H   Magnesium Gluconate  500 mg Oral Q0600   moxifloxacin  1 drop Left Eye Q1H   multivitamin with minerals  1 tablet Oral Daily   nicotine  14 mg Transdermal Daily   polyethylene glycol  17 g Oral Daily   saccharomyces boulardii  250 mg Oral BID   senna-docusate  1 tablet Oral BID   tiZANidine  4 mg Oral QHS   vitamin B-12  100 mcg Oral Daily   Continuous Infusions:  Tobramycin 15mg /ml (Fortified) Eye Drops     vancomycin Stopped (11/29/20 0223)     LOS: 3 days   01/29/21, MD [facility] Office  [phone] Pager - Text Page per [phone]  If 7PM-7AM, please contact night-coverage per Amion 11/29/2020, 9:02 AM

## 2020-11-29 NOTE — Progress Notes (Signed)
Patient ID: Ryan Gallagher, male   DOB: 1955-10-13, 65 y.o.   MRN: [id number] Subjective: Pt states decreased pain, increased appetite.  Objective: Vital signs in last 24 hours: Temp:  [98 F (36.7 C)-99 F (37.2 C)] 99 F (37.2 C) (09/05 1150) Pulse Rate:  [49-64] 54 (09/05 1150) Resp:  [16-18] 18 (09/05 1150) BP: (104-126)/(65-88) 120/76 (09/05 1150) SpO2:  [94 %-99 %] 98 % (09/05 1150)    Objective :  VA is HM.                    IOP is                    Decreased lid edema , erythema.                    Normal motility without pain                    3-4 plus injection.                   Ulcer is 8 mm in diameter. Decreased stromal infiltrate . Still with hypopyon.  Recent Labs    11/26/20 1404 11/27/20 0600 11/28/20 0405  WBC 16.0* 12.7* 7.7  HGB 16.2 14.4 14.7  HCT 46.5 41.4 42.3  NA 134* 134*  --   K 4.3 4.2  --   CL 96* 101  --   CO2 29 26  --   BUN 11 12  --   CREATININE 0.89 0.97  --     Studies/Results: No results found.  Medications: I have reviewed the patient's current medications.  Assessment/Plan:   Continue current oral and topical therapy.  Pt likely appropriate for d/c home tomorrow. He agrees to daily follow up with me as an outpatient at the [facility] starting tomorrow pm.  LOS: 3 days   Galen Manila 9/5/202212:50 PM

## 2020-11-30 DIAGNOSIS — H16032 Corneal ulcer with hypopyon, left eye: Secondary | ICD-10-CM | POA: Diagnosis not present

## 2020-11-30 LAB — AEROBIC CULTURE W GRAM STAIN (SUPERFICIAL SPECIMEN)

## 2020-11-30 LAB — HEMOGLOBIN A1C
Hgb A1c MFr Bld: 5.9 % — ABNORMAL HIGH (ref 4.8–5.6)
Mean Plasma Glucose: 123 mg/dL

## 2020-11-30 MED ORDER — ATROPINE SULFATE 1 % OP SOLN: 1.0000 [drp] | Freq: Two times a day (BID) | OPHTHALMIC | 0 refills | Status: AC

## 2020-11-30 MED ORDER — MOXIFLOXACIN HCL 0.5 % OP SOLN: 1.0000 [drp] | OPHTHALMIC | 0 refills | Status: AC

## 2020-11-30 MED ORDER — CIPROFLOXACIN HCL 750 MG PO TABS: 750.0000 mg | ORAL_TABLET | Freq: Two times a day (BID) | ORAL | 0 refills | Status: AC

## 2020-11-30 NOTE — Discharge Summary (Addendum)
Physician Discharge Summary  Ryan Gallagher [facility] PFX:[id number] DOB: 12-May-1955 DOA: 11/26/2020  PCP: Lyndon Code, MD  Admit date: 11/26/2020 Discharge date: 11/30/2020  Discharge disposition: Home   Recommendations for Outpatient Follow-Up:   Follow-up with Dr. Druscilla Brownie, ophthalmologist, next office on 11/30/2020 at 3:30 PM.   Discharge Diagnosis:   Principal Problem:   Orbital cellulitis on left Active Problems:   Nicotine dependence    Discharge Condition: Stable.  Diet recommendation:  Diet Order             Diet - low sodium heart healthy           Diet regular Room service appropriate? Yes; Fluid consistency: Thin  Diet effective now                     Code Status: Full Code     Hospital Course:   Mr. Ryan Gallagher is 65 year old with a history of GERD/HH, migraines, and tobacco abuse who presented to the ER with a 4-day history of worsening left eye pain with associated purulent drainage and impaired vision.  4 days prior to his admission he felt an irritation in his left eye and therefore removed his contact lenses.  His left eye gradually became more and more red and irritated.  2 days prior to his admission he developed severe pain in the left eye in the evening before his presentation his vision in the eye became significantly impaired.  In the ED maxillofacial CT noted left periorbital soft tissue swelling as well as subtle asymmetric enhancement of the left medial rectus muscle worrisome for early myositis and post septal orbital extension of infection.  Severe right maxillary sinusitis was also noted.  He was given IV antibiotics and antibiotic eyedrops for orbital cellulitis and Pseudomonas keratitis.  Corneal culture showed Pseudomonas aeruginosa.  Ophthalmologist was consulted to assist with management.  His condition has improved and he is deemed stable for discharge to home today.  Case was discussed with Dr. Druscilla Brownie, ophthalmologist, who cleared  the patient for discharge.  He will see the patient in his office for follow-up later today.    Medical Consultants:   Dr. Druscilla Brownie, ophthalmologist   Discharge Exam:    Vitals:   11/30/20 0006 11/30/20 0547 11/30/20 0730 11/30/20 1150  BP: 109/68 120/82 124/80 117/76  Pulse: (!) 53 (!) 55 (!) 53 (!) 57  Resp: 16 16 18 18   Temp: 98.4 F (36.9 C) 98.5 F (36.9 C) 98 F (36.7 C) 97.8 F (36.6 C)  TempSrc:   Oral   SpO2: 95% 94% 95% 94%  Weight:      Height:         GEN: NAD SKIN: Rash on the back EYES: Abnormal looking left eye.  Redness in the left eye. No drainage noted from the left eye.  He cannot count fingers from the left eye but light perception is intact. ENT: MMM CV: RRR PULM: CTA B ABD: soft, ND, NT, +BS CNS: AAO x 3, non focal EXT: No edema or tenderness   The results of significant diagnostics from this hospitalization (including imaging, microbiology, ancillary and laboratory) are listed below for reference.     Procedures and Diagnostic Studies:   CT Maxillofacial W Contrast  Result Date: 11/26/2020 CLINICAL DATA:  Provided history: Cellulitis, face, periorbital edema; purulent drainage left eye. Patient presents with left eye drainage and infection. EXAM: CT MAXILLOFACIAL WITH CONTRAST TECHNIQUE: Multidetector CT imaging of the maxillofacial structures was performed  with intravenous contrast. Multiplanar CT image reconstructions were also generated. CONTRAST:  75mL OMNIPAQUE IOHEXOL 350 MG/ML SOLN COMPARISON:  No pertinent prior exams available for comparison. FINDINGS: Osseous: No maxillofacial fracture. Orbits: Left periorbital soft tissue swelling, enhancement and induration compatible with periorbital cellulitis. There is subtle asymmetric enhancement of the left medial rectus muscle (series 2, image 25) (series 4, image 34). The globes are normal in size and contour. The optic nerve sheath complexes are symmetric and unremarkable. Sinuses: Complete  opacification of the right maxillary sinus this could reflect odontogenic sinusitis as there is periapical lucency surrounding the right maxillary first molar. Partial opacification of the anterior right ethmoid air cells. Trace mucosal thickening within the left ethmoid air cells. Trace mucosal thickening along the floor of the left maxillary sinus. This could also reflect odontogenic sinusitis as there is periapical lucency surrounding multiple left maxillary molars. Soft tissues: Left periorbital soft tissue swelling, enhancement and induration compatible with periorbital cellulitis as described above. The visualized maxillofacial and upper neck soft tissues are otherwise unremarkable. Limited intracranial: No evidence of acute intracranial abnormality within the field of view. IMPRESSION: Left periorbital soft tissue swelling, enhancement and induration compatible with periorbital cellulitis. Additionally, there is subtle asymmetric enhancement of the left medial rectus muscle suspicious for early myositis and postseptal orbital extension of infection (orbital cellulitis). Paranasal sinus disease, most notably severe right maxillary sinusitis. Bilateral maxillary sinusitis may be odontogenic in origin as there is periapical lucency surrounding bilateral maxillary molars. Electronically Signed   By: Jackey LogeKyle  Golden D.O.   On: 11/26/2020 15:28     Labs:   Basic Metabolic Panel: Recent Labs  Lab 11/26/20 1404 11/27/20 0600  NA 134* 134*  K 4.3 4.2  CL 96* 101  CO2 29 26  GLUCOSE 120* 117*  BUN 11 12  CREATININE 0.89 0.97  CALCIUM 9.4 8.7*   GFR Estimated Creatinine Clearance: 93.1 mL/min (by C-G formula based on SCr of 0.97 mg/dL). Liver Function Tests: No results for input(s): AST, ALT, ALKPHOS, BILITOT, PROT, ALBUMIN in the last 168 hours. No results for input(s): LIPASE, AMYLASE in the last 168 hours. No results for input(s): AMMONIA in the last 168 hours. Coagulation profile No results  for input(s): INR, PROTIME in the last 168 hours.  CBC: Recent Labs  Lab 11/26/20 1404 11/27/20 0600 11/28/20 0405  WBC 16.0* 12.7* 7.7  NEUTROABS 13.1*  --   --   HGB 16.2 14.4 14.7  HCT 46.5 41.4 42.3  MCV 93.0 94.1 93.2  PLT 267 221 211   Cardiac Enzymes: No results for input(s): CKTOTAL, CKMB, CKMBINDEX, TROPONINI in the last 168 hours. BNP: Invalid input(s): POCBNP CBG: No results for input(s): GLUCAP in the last 168 hours. D-Dimer No results for input(s): DDIMER in the last 72 hours. Hgb A1c Recent Labs    11/28/20 0405  HGBA1C 5.9*   Lipid Profile No results for input(s): CHOL, HDL, LDLCALC, TRIG, CHOLHDL, LDLDIRECT in the last 72 hours. Thyroid function studies No results for input(s): TSH, T4TOTAL, T3FREE, THYROIDAB in the last 72 hours.  Invalid input(s): FREET3 Anemia work up No results for input(s): VITAMINB12, FOLATE, FERRITIN, TIBC, IRON, RETICCTPCT in the last 72 hours. Microbiology Recent Results (from the past 240 hour(s))  Resp Panel by RT-PCR (Flu A&B, Covid) Nasopharyngeal Swab     Status: None   Collection Time: 11/26/20  4:25 PM   Specimen: Nasopharyngeal Swab; Nasopharyngeal(NP) swabs in vial transport medium  Result Value Ref Range Status   SARS Coronavirus  2 by RT PCR NEGATIVE NEGATIVE Final    Comment: (NOTE) SARS-CoV-2 target nucleic acids are NOT DETECTED.  The SARS-CoV-2 RNA is generally detectable in upper respiratory specimens during the acute phase of infection. The lowest concentration of SARS-CoV-2 viral copies this assay can detect is 138 copies/mL. A negative result does not preclude SARS-Cov-2 infection and should not be used as the sole basis for treatment or other patient management decisions. A negative result may occur with  improper specimen collection/handling, submission of specimen other than nasopharyngeal swab, presence of viral mutation(s) within the areas targeted by this assay, and inadequate number of  viral copies(<138 copies/mL). A negative result must be combined with clinical observations, patient history, and epidemiological information. The expected result is Negative.  Fact Sheet for Patients:  BloggerCourse.com  Fact Sheet for Healthcare Providers:  SeriousBroker.it  This test is no t yet approved or cleared by the [place] FDA and  has been authorized for detection and/or diagnosis of SARS-CoV-2 by FDA under an Emergency Use Authorization (EUA). This EUA will remain  in effect (meaning this test can be used) for the duration of the COVID-19 declaration under Section 564(b)(1) of the Act, 21 U.S.C.section 360bbb-3(b)(1), unless the authorization is terminated  or revoked sooner.       Influenza A by PCR NEGATIVE NEGATIVE Final   Influenza B by PCR NEGATIVE NEGATIVE Final    Comment: (NOTE) The Xpert Xpress SARS-CoV-2/FLU/RSV plus assay is intended as an aid in the diagnosis of influenza from Nasopharyngeal swab specimens and should not be used as a sole basis for treatment. Nasal washings and aspirates are unacceptable for Xpert Xpress SARS-CoV-2/FLU/RSV testing.  Fact Sheet for Patients: BloggerCourse.com  Fact Sheet for Healthcare Providers: SeriousBroker.it  This test is not yet approved or cleared by the [place] FDA and has been authorized for detection and/or diagnosis of SARS-CoV-2 by FDA under an Emergency Use Authorization (EUA). This EUA will remain in effect (meaning this test can be used) for the duration of the COVID-19 declaration under Section 564(b)(1) of the Act, 21 U.S.C. section 360bbb-3(b)(1), unless the authorization is terminated or revoked.  Performed at [facility], [address], [place], [place] [address]   Aerobic Culture w Gram Stain (superficial specimen)     Status: None   Collection Time: 11/26/20  6:10 PM    Specimen: Cornea  Result Value Ref Range Status   Specimen Description   Final    CORNEA Performed at [facility], [address] [place], [place] [address]    Special Requests   Final    NONE Performed at [facility], [address] [place], [place] [address]    Gram Stain   Final    ABUNDANT WBC PRESENT, PREDOMINANTLY PMN NO ORGANISMS SEEN    Culture   Final    RARE PSEUDOMONAS AERUGINOSA RESULT CALLED TO, READ BACK BY AND VERIFIED WITH: RN T.ROBINSON ON [id number] AT 1328 BY E.PARRISH Performed at [facility], [address], [place], [place] [address]    Report Status 11/30/2020 FINAL  Final   Organism ID, Bacteria PSEUDOMONAS AERUGINOSA  Final      Susceptibility   Pseudomonas aeruginosa - MIC*    CEFTAZIDIME 2 SENSITIVE Sensitive     CIPROFLOXACIN <=0.25 SENSITIVE Sensitive     GENTAMICIN <=1 SENSITIVE Sensitive     IMIPENEM 1 SENSITIVE Sensitive     PIP/TAZO <=4 SENSITIVE Sensitive     CEFEPIME 2 SENSITIVE Sensitive     *  RARE PSEUDOMONAS AERUGINOSA     Discharge Instructions:   Discharge Instructions     Diet - low sodium heart healthy   Complete by: As directed    Discharge instructions   Complete by: As directed    Continue tobramycin eyedrops.  Apply to left eye every 2 hours as instructed.  Follow-up with Dr. Druscilla Brownie, ophthalmologist, in his office on 11/30/2020 at 3:30 PM   Increase activity slowly   Complete by: As directed       Allergies as of 11/30/2020   No Known Allergies      Medication List     STOP taking these medications    tiZANidine 4 MG tablet Commonly known as: ZANAFLEX       TAKE these medications    aspirin-acetaminophen-caffeine 250-250-65 MG tablet Commonly known as: EXCEDRIN MIGRAINE Take 1 tablet by mouth every 6 (six) hours as needed for headache.   atropine 1 % ophthalmic solution Place 1 drop into the left eye 2 (two) times daily for 5 days.   BENADRYL ALLERGY PO Take by mouth.    Benadryl Itch Stopping 2 % Gel Generic drug: DIPHENHYDRAMINE HCL (TOPICAL) Apply topically.   calcium carbonate 500 MG chewable tablet Commonly known as: TUMS - dosed in mg elemental calcium Chew 1 tablet by mouth as needed for indigestion or heartburn.   ciprofloxacin 750 MG tablet Commonly known as: CIPRO Take 1 tablet (750 mg total) by mouth 2 (two) times daily for 5 days.   Magnesium Gluconate 500 (27 Mg) MG Tabs Take 500 mg by mouth daily at 6 (six) AM.   moxifloxacin 0.5 % ophthalmic solution Commonly known as: VIGAMOX Place 1 drop into the left eye every hour for 5 days.   multivitamin with minerals tablet Take 1 tablet by mouth daily.   vitamin B-12 100 MCG tablet Commonly known as: CYANOCOBALAMIN Take 100 mcg by mouth daily.          Time coordinating discharge: 33 minutes  Signed:  Nicholle Falzon  [facility] 11/30/2020, 11:58 AM   Pager on [email or web address]. If 7PM-7AM, please contact night-coverage at [email or web address]

## 2020-11-30 NOTE — Plan of Care (Signed)
Patient orientedx4, VSS, independent with ambulation/toileting. Pain addressed with PRN Fioricet and oxycodone per MAR. Eye drops continued q1hr. Safety precautions in place, rounding performed, needs/concerns addressed.   Problem: Education: Goal: Knowledge of General Education information will improve Description: Including pain rating scale, medication(s)/side effects and non-pharmacologic comfort measures Outcome: Progressing   Problem: Health Behavior/Discharge Planning: Goal: Ability to manage health-related needs will improve Outcome: Progressing   Problem: Clinical Measurements: Goal: Ability to maintain clinical measurements within normal limits will improve Outcome: Progressing Goal: Will remain free from infection Outcome: Progressing Goal: Diagnostic test results will improve Outcome: Progressing Goal: Respiratory complications will improve Outcome: Progressing Goal: Cardiovascular complication will be avoided Outcome: Progressing   Problem: Activity: Goal: Risk for activity intolerance will decrease Outcome: Progressing   Problem: Nutrition: Goal: Adequate nutrition will be maintained Outcome: Progressing   Problem: Coping: Goal: Level of anxiety will decrease Outcome: Progressing   Problem: Elimination: Goal: Will not experience complications related to bowel motility Outcome: Progressing Goal: Will not experience complications related to urinary retention Outcome: Progressing   Problem: Pain Managment: Goal: General experience of comfort will improve Outcome: Progressing   Problem: Safety: Goal: Ability to remain free from injury will improve Outcome: Progressing   Problem: Skin Integrity: Goal: Risk for impaired skin integrity will decrease Outcome: Progressing

## 2020-11-30 NOTE — Care Management Important Message (Signed)
Important Message  Patient Details  Name: Ryan Gallagher MRN: [id number] Date of Birth: 1955/10/02   Medicare Important Message Given:  Yes     [name] [name] 11/30/2020, 12:46 PM

## 2020-12-01 DIAGNOSIS — H16032 Corneal ulcer with hypopyon, left eye: Secondary | ICD-10-CM | POA: Diagnosis not present

## 2020-12-02 DIAGNOSIS — H16032 Corneal ulcer with hypopyon, left eye: Secondary | ICD-10-CM | POA: Diagnosis not present

## 2020-12-03 DIAGNOSIS — H16032 Corneal ulcer with hypopyon, left eye: Secondary | ICD-10-CM | POA: Diagnosis not present

## 2020-12-06 DIAGNOSIS — H16032 Corneal ulcer with hypopyon, left eye: Secondary | ICD-10-CM | POA: Diagnosis not present

## 2020-12-07 ENCOUNTER — Inpatient Hospital Stay: Payer: Medicare Other | Admitting: Nurse Practitioner

## 2020-12-07 DIAGNOSIS — H16032 Corneal ulcer with hypopyon, left eye: Secondary | ICD-10-CM | POA: Diagnosis not present

## 2020-12-09 DIAGNOSIS — H16032 Corneal ulcer with hypopyon, left eye: Secondary | ICD-10-CM | POA: Diagnosis not present

## 2020-12-13 DIAGNOSIS — H16032 Corneal ulcer with hypopyon, left eye: Secondary | ICD-10-CM | POA: Diagnosis not present

## 2020-12-15 DIAGNOSIS — H16032 Corneal ulcer with hypopyon, left eye: Secondary | ICD-10-CM | POA: Diagnosis not present

## 2020-12-22 DIAGNOSIS — H16032 Corneal ulcer with hypopyon, left eye: Secondary | ICD-10-CM | POA: Diagnosis not present

## 2020-12-29 DIAGNOSIS — H16032 Corneal ulcer with hypopyon, left eye: Secondary | ICD-10-CM | POA: Diagnosis not present

## 2021-01-01 DIAGNOSIS — Z23 Encounter for immunization: Secondary | ICD-10-CM | POA: Diagnosis not present

## 2021-01-03 DIAGNOSIS — H16032 Corneal ulcer with hypopyon, left eye: Secondary | ICD-10-CM | POA: Diagnosis not present

## 2021-01-12 DIAGNOSIS — H16032 Corneal ulcer with hypopyon, left eye: Secondary | ICD-10-CM | POA: Diagnosis not present

## 2021-01-19 DIAGNOSIS — H179 Unspecified corneal scar and opacity: Secondary | ICD-10-CM | POA: Diagnosis not present

## 2021-02-08 ENCOUNTER — Telehealth: Payer: Self-pay

## 2021-02-08 NOTE — Telephone Encounter (Signed)
Left vm to confirm 02/09/21 appointment-Toni 

## 2021-02-09 ENCOUNTER — Ambulatory Visit: Payer: Medicare Other | Admitting: Nurse Practitioner

## 2021-02-23 DIAGNOSIS — H16122 Filamentary keratitis, left eye: Secondary | ICD-10-CM | POA: Diagnosis not present

## 2021-03-09 ENCOUNTER — Other Ambulatory Visit: Payer: Self-pay

## 2021-03-09 ENCOUNTER — Emergency Department
Admission: EM | Admit: 2021-03-09 | Discharge: 2021-03-09 | Disposition: A | Discharge status: Home / Self Care | Payer: Medicare Other | Attending: Emergency Medicine | Admitting: Emergency Medicine

## 2021-03-09 ENCOUNTER — Emergency Department: Payer: Medicare Other

## 2021-03-09 DIAGNOSIS — R0789 Other chest pain: Secondary | ICD-10-CM | POA: Diagnosis not present

## 2021-03-09 DIAGNOSIS — Z28310 Unvaccinated for covid-19: Secondary | ICD-10-CM | POA: Insufficient documentation

## 2021-03-09 DIAGNOSIS — R079 Chest pain, unspecified: Secondary | ICD-10-CM | POA: Diagnosis not present

## 2021-03-09 DIAGNOSIS — U071 COVID-19: Secondary | ICD-10-CM | POA: Insufficient documentation

## 2021-03-09 DIAGNOSIS — F17210 Nicotine dependence, cigarettes, uncomplicated: Secondary | ICD-10-CM | POA: Diagnosis not present

## 2021-03-09 DIAGNOSIS — F17200 Nicotine dependence, unspecified, uncomplicated: Secondary | ICD-10-CM | POA: Diagnosis not present

## 2021-03-09 DIAGNOSIS — Z720 Tobacco use: Secondary | ICD-10-CM

## 2021-03-09 LAB — CBC
HCT: 48.9 % (ref 39.0–52.0)
Hemoglobin: 16.1 g/dL (ref 13.0–17.0)
MCH: 30.6 pg (ref 26.0–34.0)
MCHC: 32.9 g/dL (ref 30.0–36.0)
MCV: 92.8 fL (ref 80.0–100.0)
Platelets: 195 10*3/uL (ref 150–400)
RBC: 5.27 MIL/uL (ref 4.22–5.81)
RDW: 13.2 % (ref 11.5–15.5)
WBC: 3.4 10*3/uL — ABNORMAL LOW (ref 4.0–10.5)
nRBC: 0 % (ref 0.0–0.2)

## 2021-03-09 LAB — BASIC METABOLIC PANEL
Anion gap: 6 (ref 5–15)
BUN: 17 mg/dL (ref 8–23)
CO2: 25 mmol/L (ref 22–32)
Calcium: 8.5 mg/dL — ABNORMAL LOW (ref 8.9–10.3)
Chloride: 102 mmol/L (ref 98–111)
Creatinine, Ser: 1.02 mg/dL (ref 0.61–1.24)
GFR, Estimated: >60 mL/min (ref >60)
Glucose, Bld: 104 mg/dL — ABNORMAL HIGH (ref 70–99)
Potassium: 4.1 mmol/L (ref 3.5–5.1)
Sodium: 133 mmol/L — ABNORMAL LOW (ref 135–145)

## 2021-03-09 LAB — RESP PANEL BY RT-PCR (FLU A&B, COVID) ARPGX2
Influenza A by PCR: NEGATIVE
Influenza B by PCR: NEGATIVE
SARS Coronavirus 2 by RT PCR: POSITIVE — AB

## 2021-03-09 LAB — TROPONIN I (HIGH SENSITIVITY)
Troponin I (High Sensitivity): 4 ng/L (ref <18)
Troponin I (High Sensitivity): 3 ng/L (ref <18)

## 2021-03-09 LAB — D-DIMER, QUANTITATIVE: D-Dimer, Quant: 0.42 ug/mL-FEU (ref 0.00–0.50)

## 2021-03-09 MED ORDER — NIRMATRELVIR/RITONAVIR (PAXLOVID)TABLET: 3.0000 | ORAL_TABLET | Freq: Two times a day (BID) | ORAL | 0 refills | Status: AC

## 2021-03-09 MED ORDER — NAPROXEN 500 MG PO TABS
500.0000 mg | ORAL_TABLET | Freq: Once | ORAL | Status: AC
  Administered 2021-03-09: 12:00:00 500 mg via ORAL
  Filled 2021-03-09: qty 1

## 2021-03-09 NOTE — ED Provider Notes (Signed)
[facility] Emergency Department Provider Note  ____________________________________________   Event Date/Time   First MD Initiated Contact with Patient 03/09/21 1101     (approximate)  I have reviewed the triage vital signs and the nursing notes.   HISTORY  Chief Complaint Chest Pain   HPI Ryan Gallagher is a 65 y.o. male with below noted past medical history who presents for assessment of left-sided chest pain last couple days associate with some diarrhea.  He also reports shortness of breath with exertion and a little bit of achiness in his arms.  He has had a mild headache as well.  No significant fevers, cough, abdominal pain, vomiting, blood in his diarrhea, urinary symptoms, rash or other acute sick symptoms.  He does endorse tobacco abuse but denies EtOH or illicit drug use.  No recent falls or injuries.  He has not been vaccinated for COVID.  No other acute concerns at this time.         Past Medical History:  Diagnosis Date   Arthritis    Diverticulitis    Enlarged prostate    GERD (gastroesophageal reflux disease)    OCC   Headache    MIGRAINES   History of hiatal hernia     Patient Active Problem List   Diagnosis Date Noted   Orbital cellulitis on left 11/26/2020   Nicotine dependence 11/26/2020   BPH with obstruction/lower urinary tract symptoms 04/19/2018    Past Surgical History:  Procedure Laterality Date   HOLEP-LASER ENUCLEATION OF THE PROSTATE WITH MORCELLATION N/A 04/19/2018   Procedure: HOLEP-LASER ENUCLEATION OF THE PROSTATE WITH MORCELLATION;  Surgeon: Sondra Come, MD;  Location: [facility] ORS;  Service: Urology;  Laterality: N/A;   NO PAST SURGERIES      Prior to Admission medications   Medication Sig Start Date End Date Taking? Authorizing Provider  nirmatrelvir/ritonavir EUA (PAXLOVID) 20 x 150 MG & 10 x 100MG  TABS Take 3 tablets by mouth 2 (two) times daily for 5 days. Patient GFR is >60. Take nirmatrelvir (150  mg) two tablets twice daily for 5 days and ritonavir (100 mg) one tablet twice daily for 5 days. 03/09/21 03/14/21 Yes 03/16/21, MD  aspirin-acetaminophen-caffeine (EXCEDRIN MIGRAINE) [phone] MG per tablet Take 1 tablet by mouth every 6 (six) hours as needed for headache.    [provider]  calcium carbonate (TUMS - DOSED IN MG ELEMENTAL CALCIUM) 500 MG chewable tablet Chew 1 tablet by mouth as needed for indigestion or heartburn.    [provider]  diphenhydrAMINE HCl (BENADRYL ALLERGY PO) Take by mouth.    [provider]  DIPHENHYDRAMINE HCL, TOPICAL, (BENADRYL ITCH STOPPING) 2 % GEL Apply topically.    [provider]  Magnesium Gluconate 500 (27 Mg) MG TABS Take 500 mg by mouth daily at 6 (six) AM.    [provider]  Multiple Vitamins-Minerals (MULTIVITAMIN WITH MINERALS) tablet Take 1 tablet by mouth daily.    [provider]  vitamin B-12 (CYANOCOBALAMIN) 100 MCG tablet Take 100 mcg by mouth daily.    [provider]    Allergies Patient has no known allergies.  Family History  Problem Relation Age of Onset   Alzheimer's disease Mother    Alzheimer's disease Father    Diabetes Paternal Uncle    Prostate cancer Neg Hx    Bladder Cancer Neg Hx    Kidney cancer Neg Hx     Social History Social History   Tobacco Use  Smoking status: Every Day    Packs/day: 0.50    Years: 50.00    Pack years: 25.00    Types: Cigarettes   Smokeless tobacco: Never  Substance Use Topics   Alcohol use: Yes    Comment: occ   Drug use: Never    Review of Systems  Review of Systems  Constitutional:  Positive for malaise/fatigue. Negative for chills and fever.  HENT:  Negative for sore throat.   Eyes:  Negative for pain.  Respiratory:  Positive for shortness of breath. Negative for cough and stridor.   Cardiovascular:  Positive for chest pain.  Gastrointestinal:  Positive for diarrhea. Negative for vomiting.   Genitourinary:  Negative for dysuria.  Musculoskeletal:  Positive for myalgias.  Skin:  Negative for rash.  Neurological:  Negative for seizures, loss of consciousness and headaches.  Psychiatric/Behavioral:  Positive for substance abuse (tobacco abuse). Negative for suicidal ideas.   All other systems reviewed and are negative.    ____________________________________________   PHYSICAL EXAM:  VITAL SIGNS: ED Triage Vitals  Enc Vitals Group     BP 03/09/21 0919 126/88     Pulse Rate 03/09/21 0919 75     Resp 03/09/21 0919 20     Temp 03/09/21 0919 98 F (36.7 C)     Temp src --      SpO2 03/09/21 0919 95 %     Weight 03/09/21 0920 220 lb (99.8 kg)     Height 03/09/21 0920 6\' 5"  (1.956 m)     Head Circumference --      Peak Flow --      Pain Score 03/09/21 0919 3     Pain Loc --      Pain Edu? --      Excl. in GC? --    Vitals:   03/09/21 1145 03/09/21 1253  BP: 114/81 109/73  Pulse: (!) 56 (!) 56  Resp: 12 18  Temp:    SpO2: 97% 96%   Physical Exam Vitals and nursing note reviewed.  Constitutional:      General: He is not in acute distress.    Appearance: He is well-developed.  HENT:     Head: Normocephalic and atraumatic.  Eyes:     Conjunctiva/sclera: Conjunctivae normal.  Cardiovascular:     Rate and Rhythm: Normal rate and regular rhythm.     Heart sounds: No murmur heard. Pulmonary:     Effort: Pulmonary effort is normal. No respiratory distress.     Breath sounds: Normal breath sounds.  Abdominal:     Palpations: Abdomen is soft.     Tenderness: There is no abdominal tenderness.  Musculoskeletal:        General: No swelling.     Cervical back: Neck supple.  Skin:    General: Skin is warm and dry.     Capillary Refill: Capillary refill takes less than 2 seconds.  Neurological:     Mental Status: He is alert.  Psychiatric:        Mood and Affect: Mood normal.     ____________________________________________   LABS (all labs ordered are  listed, but only abnormal results are displayed)  Labs Reviewed  RESP PANEL BY RT-PCR (FLU A&B, COVID) ARPGX2 - Abnormal; Notable for the following components:      Result Value   SARS Coronavirus 2 by RT PCR POSITIVE (*)    All other components within normal limits  BASIC METABOLIC PANEL - Abnormal; Notable for the following components:  Sodium 133 (*)    Glucose, Bld 104 (*)    Calcium 8.5 (*)    All other components within normal limits  CBC - Abnormal; Notable for the following components:   WBC 3.4 (*)    All other components within normal limits  D-DIMER, QUANTITATIVE  TROPONIN I (HIGH SENSITIVITY)  TROPONIN I (HIGH SENSITIVITY)   ____________________________________________  EKG  ECG remarkable for sinus rhythm with a right axis deviation, unremarkable intervals and some nonspecific changes versus artifact in inferior leads without other clearance of acute ischemia or significant arrhythmia. ____________________________________________  RADIOLOGY  ED MD interpretation: Chest x-ray shows no focal consolidation, effusion, overt edema, pneumothorax or other clear acute thoracic process.  There is a previously noted left-sided lung nodule slightly enlarged from when it was last imaged on CT in 2015.  Official radiology report(s): DG Chest 2 View  Result Date: 03/09/2021 CLINICAL DATA:  Central chest pain radiating to the left over the last few days. EXAM: CHEST - 2 VIEW COMPARISON:  11/04/2013 FINDINGS: Heart size is normal. Mediastinal shadows are normal. No evidence of active infiltrate, edema, effusion or collapse. There is a 7 mm nodule projected over the left seventh rib. Previously this was seen projected over the eighth rib. Therefore, this is not a bony density. This may be slightly larger than was seen in 2015 if chest CT does not exist elsewhere, CT would be suggested for further evaluation. IMPRESSION: No active cardiopulmonary disease. No cause of acute chest pain  identified. 7 mm nodule projected over the left seventh rib, slightly larger than was seen in 2015. Chest CT suggested for further evaluation. Electronically Signed   By: Paulina Fusi M.D.   On: 03/09/2021 10:02    ____________________________________________   PROCEDURES  Procedure(s) performed (including Critical Care):  Procedures   ____________________________________________   INITIAL IMPRESSION / ASSESSMENT AND PLAN / ED COURSE      Patient presents with above-stated history and exam for assessment of above-noted constellation of symptoms.  On arrival he is afebrile and hemodynamically stable.  Primary differential considerations include acute viral infection, ACS, PE, pneumonia, metabolic derangements and dehydration, obstructive airway disease exacerbation, arrhythmia and anemia.  Patient does not appear volume overloaded.  ECG remarkable for sinus rhythm with a right axis deviation, unremarkable intervals and some nonspecific changes versus artifact in inferior leads without other clearance of acute ischemia or significant arrhythmia.  Given nonelevated troponin x2 Evalose suspicion for ACS or myocarditis.  Chest x-ray shows no focal consolidation, effusion, overt edema, pneumothorax or other clear acute thoracic process.  There is a previously noted left-sided lung nodule slightly enlarged from when it was last imaged on CT in 2015.  I discussed this finding of nodule with patient recommendation for close outpatient PCP follow-up for further surveillance imaging.  Believe he requires a CT at this time and have a low suspicion for PE as D-dimer is 0.42.  Patient has no evidence of hypoxia, tachypnea, tachycardia or increased work of breathing or wheezing on exam at this time unless patient for acute obstructive airway disease exacerbation or respiratory failure.  Counseled patient at length on dangers of tobacco abuse and encouraged him to quit this.  COVID PCR is positive and  influenza is negative.  I suspect this is the primary etiology of patient's symptoms.  CBC shows WBC count of 3.4 but no evidence of acute anemia.  BMP shows no significant electrolyte or metabolic derangements.  Patient is not appear significantly volume down or  septic.  He is feeling better after some naproxen.  Given stable vitals with laceration exam work-up I think he is stable for discharge with outpatient follow-up.  Discharged in stable condition.  Strict return precautions advised and discussed.     ____________________________________________   FINAL CLINICAL IMPRESSION(S) / ED DIAGNOSES  Final diagnoses:  COVID-19  Tobacco abuse    Medications  naproxen (NAPROSYN) tablet 500 mg (500 mg Oral Given 03/09/21 1139)     ED Discharge Orders          Ordered    nirmatrelvir/ritonavir EUA (PAXLOVID) 20 x 150 MG & 10 x 100MG  TABS  2 times daily        03/09/21 1258             Note:  This document was prepared using Dragon voice recognition software and may include unintentional dictation errors.    03/11/21, MD 03/09/21 [id number]

## 2021-03-09 NOTE — ED Notes (Signed)
Pt to ED c/o sharp intermitent CP L chest since last night. Nothing makes pain better or worse. States that while driving this morning, L arm and hand felt cold and numb. Pt in NAD.

## 2021-03-09 NOTE — ED Notes (Signed)
Sent trop drawn and sent.

## 2021-03-09 NOTE — ED Triage Notes (Signed)
Pt to ED for centralized chest pain radiating to left for the past few days. Also reports diarrhea for the past few days, reports dark in color.  +shob with exertion.

## 2021-03-16 ENCOUNTER — Telehealth: Payer: Self-pay

## 2021-03-16 NOTE — Telephone Encounter (Signed)
Pt's wife called and advised pt was seen at ER for covid positive and given paxlovid and finished it.  She advised pt has a bad sinus infection possibly and is having green mucus.  We offered to put pt on at 240pm today and she wanted to talk back to pt and see if he wants to do the virtual visit

## 2021-04-06 DIAGNOSIS — H179 Unspecified corneal scar and opacity: Secondary | ICD-10-CM | POA: Diagnosis not present

## 2021-06-15 ENCOUNTER — Ambulatory Visit: Payer: Medicare Other | Admitting: Dermatology

## 2021-10-21 DIAGNOSIS — H179 Unspecified corneal scar and opacity: Secondary | ICD-10-CM | POA: Diagnosis not present

## 2021-11-14 DIAGNOSIS — H2512 Age-related nuclear cataract, left eye: Secondary | ICD-10-CM | POA: Diagnosis not present

## 2021-11-21 ENCOUNTER — Encounter: Payer: Self-pay | Admitting: Ophthalmology

## 2021-11-23 NOTE — Discharge Instructions (Signed)

## 2021-11-29 ENCOUNTER — Ambulatory Visit: Payer: Medicare Other | Admitting: Anesthesiology

## 2021-11-29 ENCOUNTER — Encounter
Admission: RE | Disposition: A | Discharge status: Home / Self Care | Payer: Self-pay | Source: Home / Self Care | Attending: Ophthalmology

## 2021-11-29 ENCOUNTER — Ambulatory Visit
Admission: RE | Admit: 2021-11-29 | Discharge: 2021-11-29 | Disposition: A | Discharge status: Home / Self Care | Payer: Medicare Other | Attending: Ophthalmology | Admitting: Ophthalmology

## 2021-11-29 ENCOUNTER — Ambulatory Visit (AMBULATORY_SURGERY_CENTER): Payer: Medicare Other | Admitting: Anesthesiology

## 2021-11-29 ENCOUNTER — Encounter: Payer: Self-pay | Admitting: Ophthalmology

## 2021-11-29 ENCOUNTER — Other Ambulatory Visit: Payer: Self-pay

## 2021-11-29 DIAGNOSIS — N400 Enlarged prostate without lower urinary tract symptoms: Secondary | ICD-10-CM | POA: Diagnosis not present

## 2021-11-29 DIAGNOSIS — K449 Diaphragmatic hernia without obstruction or gangrene: Secondary | ICD-10-CM | POA: Insufficient documentation

## 2021-11-29 DIAGNOSIS — F17210 Nicotine dependence, cigarettes, uncomplicated: Secondary | ICD-10-CM

## 2021-11-29 DIAGNOSIS — R519 Headache, unspecified: Secondary | ICD-10-CM | POA: Insufficient documentation

## 2021-11-29 DIAGNOSIS — H2512 Age-related nuclear cataract, left eye: Secondary | ICD-10-CM | POA: Insufficient documentation

## 2021-11-29 DIAGNOSIS — K219 Gastro-esophageal reflux disease without esophagitis: Secondary | ICD-10-CM | POA: Insufficient documentation

## 2021-11-29 DIAGNOSIS — G43909 Migraine, unspecified, not intractable, without status migrainosus: Secondary | ICD-10-CM | POA: Diagnosis not present

## 2021-11-29 HISTORY — PX: CATARACT EXTRACTION W/PHACO: SHX586

## 2021-11-29 SURGERY — PHACOEMULSIFICATION, CATARACT, WITH IOL INSERTION
Anesthesia: Monitor Anesthesia Care | Site: Eye | Laterality: Left

## 2021-11-29 MED ORDER — LACTATED RINGERS IV SOLN: INTRAVENOUS | Status: DC

## 2021-11-29 MED ORDER — BRIMONIDINE TARTRATE-TIMOLOL 0.2-0.5 % OP SOLN
OPHTHALMIC | Status: DC | PRN
  Administered 2021-11-29: 1 [drp] via OPHTHALMIC

## 2021-11-29 MED ORDER — MIDAZOLAM HCL 2 MG/2ML IJ SOLN
INTRAMUSCULAR | Status: DC | PRN
  Administered 2021-11-29: 2 mg via INTRAVENOUS

## 2021-11-29 MED ORDER — SIGHTPATH DOSE#1 BSS IO SOLN
INTRAOCULAR | Status: DC | PRN
  Administered 2021-11-29: 15 mL

## 2021-11-29 MED ORDER — TETRACAINE HCL 0.5 % OP SOLN
1.0000 [drp] | OPHTHALMIC | Status: DC | PRN
  Administered 2021-11-29 (×3): 1 [drp] via OPHTHALMIC

## 2021-11-29 MED ORDER — ACETAMINOPHEN 325 MG PO TABS
650.0000 mg | ORAL_TABLET | Freq: Once | ORAL | Status: AC
  Administered 2021-11-29: 650 mg via ORAL

## 2021-11-29 MED ORDER — SIGHTPATH DOSE#1 BSS IO SOLN
INTRAOCULAR | Status: DC | PRN
  Administered 2021-11-29: 1 mL

## 2021-11-29 MED ORDER — SIGHTPATH DOSE#1 NA CHONDROIT SULF-NA HYALURON 40-17 MG/ML IO SOLN
INTRAOCULAR | Status: DC | PRN
  Administered 2021-11-29: 1 mL via INTRAOCULAR

## 2021-11-29 MED ORDER — PHENYLEPHRINE-KETOROLAC 1-0.3 % IO SOLN
INTRAOCULAR | Status: DC | PRN
  Administered 2021-11-29: 120 mL via OPHTHALMIC

## 2021-11-29 MED ORDER — ARMC OPHTHALMIC DILATING DROPS
1.0000 | OPHTHALMIC | Status: DC | PRN
  Administered 2021-11-29 (×3): 1 via OPHTHALMIC

## 2021-11-29 MED ORDER — MOXIFLOXACIN HCL 0.5 % OP SOLN
OPHTHALMIC | Status: DC | PRN
  Administered 2021-11-29: 0.2 mL via OPHTHALMIC

## 2021-11-29 SURGICAL SUPPLY — 17 items
CANNULA ANT/CHMB 27G (MISCELLANEOUS) IMPLANT
CANNULA ANT/CHMB 27GA (MISCELLANEOUS) ×1 IMPLANT
CATARACT SUITE SIGHTPATH (MISCELLANEOUS) ×1 IMPLANT
FEE CATARACT SUITE SIGHTPATH (MISCELLANEOUS) ×1 IMPLANT
GLOVE SURG ENC TEXT LTX SZ8 (GLOVE) ×1 IMPLANT
GLOVE SURG TRIUMPH 8.0 PF LTX (GLOVE) ×1 IMPLANT
LENS IOL TECNIS EYHANCE 23.0 (Intraocular Lens) IMPLANT
NDL FILTER BLUNT 18X1 1/2 (NEEDLE) ×1 IMPLANT
NEEDLE FILTER BLUNT 18X 1/2SAF (NEEDLE) ×1
NEEDLE FILTER BLUNT 18X1 1/2 (NEEDLE) ×1 IMPLANT
PACK VIT ANT 23G (MISCELLANEOUS) IMPLANT
RING MALYGIN (MISCELLANEOUS) IMPLANT
SUT ETHILON 10-0 CS-B-6CS-B-6 (SUTURE)
SUTURE EHLN 10-0 CS-B-6CS-B-6 (SUTURE) IMPLANT
SYR 3ML LL SCALE MARK (SYRINGE) ×1 IMPLANT
SYR TB 1ML LUER SLIP (SYRINGE) IMPLANT
WATER STERILE IRR 250ML POUR (IV SOLUTION) ×1 IMPLANT

## 2021-11-29 NOTE — Anesthesia Postprocedure Evaluation (Signed)
Anesthesia Post Note  Patient: Franciszek Platten Ebers  Procedure(s) Performed: CATARACT EXTRACTION PHACO AND INTRAOCULAR LENS PLACEMENT (IOC) LEFT (Left: Eye)     Patient location during evaluation: PACU Anesthesia Type: MAC Level of consciousness: awake and alert Pain management: pain level controlled Vital Signs Assessment: post-procedure vital signs reviewed and stable Respiratory status: spontaneous breathing, nonlabored ventilation, respiratory function stable and patient connected to nasal cannula oxygen Cardiovascular status: stable and blood pressure returned to baseline Postop Assessment: no apparent nausea or vomiting Anesthetic complications: no   No notable events documented.  Martha Clan

## 2021-11-29 NOTE — H&P (Signed)
[facility]   Primary Care Physician:  Lyndon Code, Ryan Ophthalmologist: Dr. Druscilla Brownie  Pre-Procedure History & Physical: HPI:  Ryan Gallagher is a 66 y.o. male here for cataract surgery.   Past Medical History:  Diagnosis Date   Arthritis    Diverticulitis    Enlarged prostate    GERD (gastroesophageal reflux disease)    OCC   Headache    MIGRAINES   History of hiatal hernia     Past Surgical History:  Procedure Laterality Date   HOLEP-LASER ENUCLEATION OF THE PROSTATE WITH MORCELLATION N/A 04/19/2018   Procedure: HOLEP-LASER ENUCLEATION OF THE PROSTATE WITH MORCELLATION;  Surgeon: Sondra Come, Ryan;  Location: [facility] ORS;  Service: Urology;  Laterality: N/A;   NO PAST SURGERIES      Prior to Admission medications   Medication Sig Start Date End Date Taking? Authorizing Provider  aspirin-acetaminophen-caffeine (EXCEDRIN MIGRAINE) [phone] MG per tablet Take 1 tablet by mouth every 6 (six) hours as needed for headache.   Yes Provider, Historical, Ryan  calcium carbonate (TUMS - DOSED IN MG ELEMENTAL CALCIUM) 500 MG chewable tablet Chew 1 tablet by mouth as needed for indigestion or heartburn.   Yes Provider, Historical, Ryan  diphenhydrAMINE HCl (BENADRYL ALLERGY PO) Take by mouth.   Yes Provider, Historical, Ryan  DIPHENHYDRAMINE HCL, TOPICAL, (BENADRYL ITCH STOPPING) 2 % GEL Apply topically.   Yes Provider, Historical, Ryan  Magnesium Gluconate 500 (27 Mg) MG TABS Take 500 mg by mouth daily at 6 (six) AM.   Yes Provider, Historical, Ryan  Multiple Vitamins-Minerals (MULTIVITAMIN WITH MINERALS) tablet Take 1 tablet by mouth daily.   Yes Provider, Historical, Ryan  vitamin B-12 (CYANOCOBALAMIN) 100 MCG tablet Take 100 mcg by mouth daily.   Yes Provider, Historical, Ryan    Allergies as of 10/27/2021   (No Known Allergies)    Family History  Problem Relation Age of Onset   Alzheimer's disease Mother    Alzheimer's disease Father    Diabetes Paternal Uncle    Prostate  cancer Neg Hx    Bladder Cancer Neg Hx    Kidney cancer Neg Hx     Social History   Socioeconomic History   Marital status: Single    Spouse name: Not on file   Number of children: Not on file   Years of education: Not on file   Highest education level: Not on file  Occupational History   Not on file  Tobacco Use   Smoking status: Every Day    Packs/day: 0.50    Years: 50.00    Total pack years: 25.00    Types: Cigarettes   Smokeless tobacco: Never  Substance and Sexual Activity   Alcohol use: Yes    Comment: occ   Drug use: Never   Sexual activity: Yes    Birth control/protection: None  Other Topics Concern   Not on file  Social History Narrative   Not on file   Social Determinants of Health   Financial Resource Strain: Not on file  Food Insecurity: Not on file  Transportation Needs: Not on file  Physical Activity: Not on file  Stress: Not on file  Social Connections: Not on file  Intimate Partner Violence: Not on file    Review of Systems: See HPI, otherwise negative ROS  Physical Exam: BP 133/89   Pulse 71   Temp (!) 97.3 F (36.3 C)   Ht 6\' 5"  (1.956 m)   Wt 98.4 kg   SpO2  95%   BMI 25.73 kg/m  General:   Alert, cooperative in NAD Head:  Normocephalic and atraumatic. Respiratory:  Normal work of breathing. Cardiovascular:  RRR  Impression/Plan: Ryan Gallagher is here for cataract surgery.  Risks, benefits, limitations, and alternatives regarding cataract surgery have been reviewed with the patient.  Questions have been answered.  All parties agreeable.   Galen Manila, Ryan  11/29/2021, 8:48 AM

## 2021-11-29 NOTE — Op Note (Signed)
PREOPERATIVE DIAGNOSIS:  Nuclear sclerotic cataract of the left eye.   POSTOPERATIVE DIAGNOSIS:  Nuclear sclerotic cataract of the left eye.   OPERATIVE PROCEDURE:ORPROCALL@   SURGEON:  Galen Manila, MD.   ANESTHESIA:  Anesthesiologist: Lenard Simmer, MD CRNA: Domenic Moras, CRNA  1.      Managed anesthesia care. 2.     0.79ml of Shugarcaine was instilled following the paracentesis   COMPLICATIONS:  None.   TECHNIQUE:   Stop and chop   DESCRIPTION OF PROCEDURE:  The patient was examined and consented in the preoperative holding area where the aforementioned topical anesthesia was applied to the left eye and then brought back to the Operating Room where the left eye was prepped and draped in the usual sterile ophthalmic fashion and a lid speculum was placed. A paracentesis was created with the side port blade and the anterior chamber was filled with viscoelastic. A near clear corneal incision was performed with the steel keratome. A continuous curvilinear capsulorrhexis was performed with a cystotome followed by the capsulorrhexis forceps. Hydrodissection and hydrodelineation were carried out with BSS on a blunt cannula. The lens was removed in a stop and chop  technique and the remaining cortical material was removed with the irrigation-aspiration handpiece. The capsular bag was inflated with viscoelastic and the Technis [id number] lens was placed in the capsular bag without complication. The remaining viscoelastic was removed from the eye with the irrigation-aspiration handpiece. The wounds were hydrated. The anterior chamber was flushed with BSS and the eye was inflated to physiologic pressure. 0.51ml Vigamox was placed in the anterior chamber. The wounds were found to be water tight. The eye was dressed with Combigan. The patient was given protective glasses to wear throughout the day and a shield with which to sleep tonight. The patient was also given drops with which to begin a drop  regimen today and will follow-up with me in one day. Implant Name Type Inv. Item Serial No. Manufacturer Lot No. LRB No. Used Action  LENS IOL TECNIS EYHANCE 23.0 - [id number] Intraocular Lens LENS IOL TECNIS EYHANCE 23.0 [id number] [employer]  Left 1 Implanted    Procedure(s) with comments: CATARACT EXTRACTION PHACO AND INTRAOCULAR LENS PLACEMENT (IOC) LEFT (Left) - 6.28 1:07.0  Electronically signed: Galen Manila 11/29/2021 9:28 AM

## 2021-11-29 NOTE — Anesthesia Preprocedure Evaluation (Signed)
Anesthesia Evaluation  Patient identified by MRN, date of birth, ID band Patient awake    Reviewed: Allergy & Precautions, H&P , NPO status , Patient's Chart, lab work & pertinent test results  History of Anesthesia Complications Negative for: history of anesthetic complications  Airway Mallampati: III       Dental  (+) Teeth Intact, Dental Advidsory Given   Pulmonary neg shortness of breath, neg COPD, neg recent URI, Current Smoker,           Cardiovascular (-) angina(-) DOE negative cardio ROS       Neuro/Psych  Headaches, neg Seizures negative psych ROS   GI/Hepatic Neg liver ROS, hiatal hernia, GERD  Controlled,  Endo/Other  negative endocrine ROS  Renal/GU      Musculoskeletal   Abdominal   Peds  Hematology negative hematology ROS (+)   Anesthesia Other Findings Past Medical History: No date: Enlarged prostate No date: GERD (gastroesophageal reflux disease)     Comment:  OCC No date: Headache     Comment:  MIGRAINES No date: History of hiatal hernia  Past Surgical History: No date: NO PAST SURGERIES  BMI    Body Mass Index:  25.85 kg/m      Reproductive/Obstetrics negative OB ROS                             Anesthesia Physical  Anesthesia Plan  ASA: II  Anesthesia Plan: MAC   Post-op Pain Management:    Induction:   PONV Risk Score and Plan: 1 and Midazolam and Treatment may vary due to age or medical condition  Airway Management Planned: Nasal Cannula and Natural Airway  Additional Equipment:   Intra-op Plan:   Post-operative Plan:   Informed Consent: I have reviewed the patients History and Physical, chart, labs and discussed the procedure including the risks, benefits and alternatives for the proposed anesthesia with the patient or authorized representative who has indicated his/her understanding and acceptance.     Dental Advisory Given  Plan  Discussed with: Anesthesiologist and CRNA  Anesthesia Plan Comments:         Anesthesia Quick Evaluation

## 2021-11-29 NOTE — Transfer of Care (Signed)
Immediate Anesthesia Transfer of Care Note  Patient: Ryan Gallagher  Procedure(s) Performed: CATARACT EXTRACTION PHACO AND INTRAOCULAR LENS PLACEMENT (IOC) LEFT (Left: Eye)  Patient Location: PACU  Anesthesia Type: MAC  Level of Consciousness: awake, alert  and patient cooperative  Airway and Oxygen Therapy: Patient Spontanous Breathing and Patient connected to supplemental oxygen  Post-op Assessment: Post-op Vital signs reviewed, Patient's Cardiovascular Status Stable, Respiratory Function Stable, Patent Airway and No signs of Nausea or vomiting  Post-op Vital Signs: Reviewed and stable  Complications: No notable events documented.

## 2021-11-30 ENCOUNTER — Encounter: Payer: Self-pay | Admitting: Ophthalmology

## 2021-12-12 DIAGNOSIS — Z23 Encounter for immunization: Secondary | ICD-10-CM | POA: Diagnosis not present

## 2022-03-26 IMAGING — CR DG CHEST 2V
2 series · 2 of 2 positions shown · non-contrast
Comparison: 11/04/2013

CLINICAL DATA: Central chest pain radiating to the left over the
last few days.

EXAM:
CHEST - 2 VIEW

[chest pa]
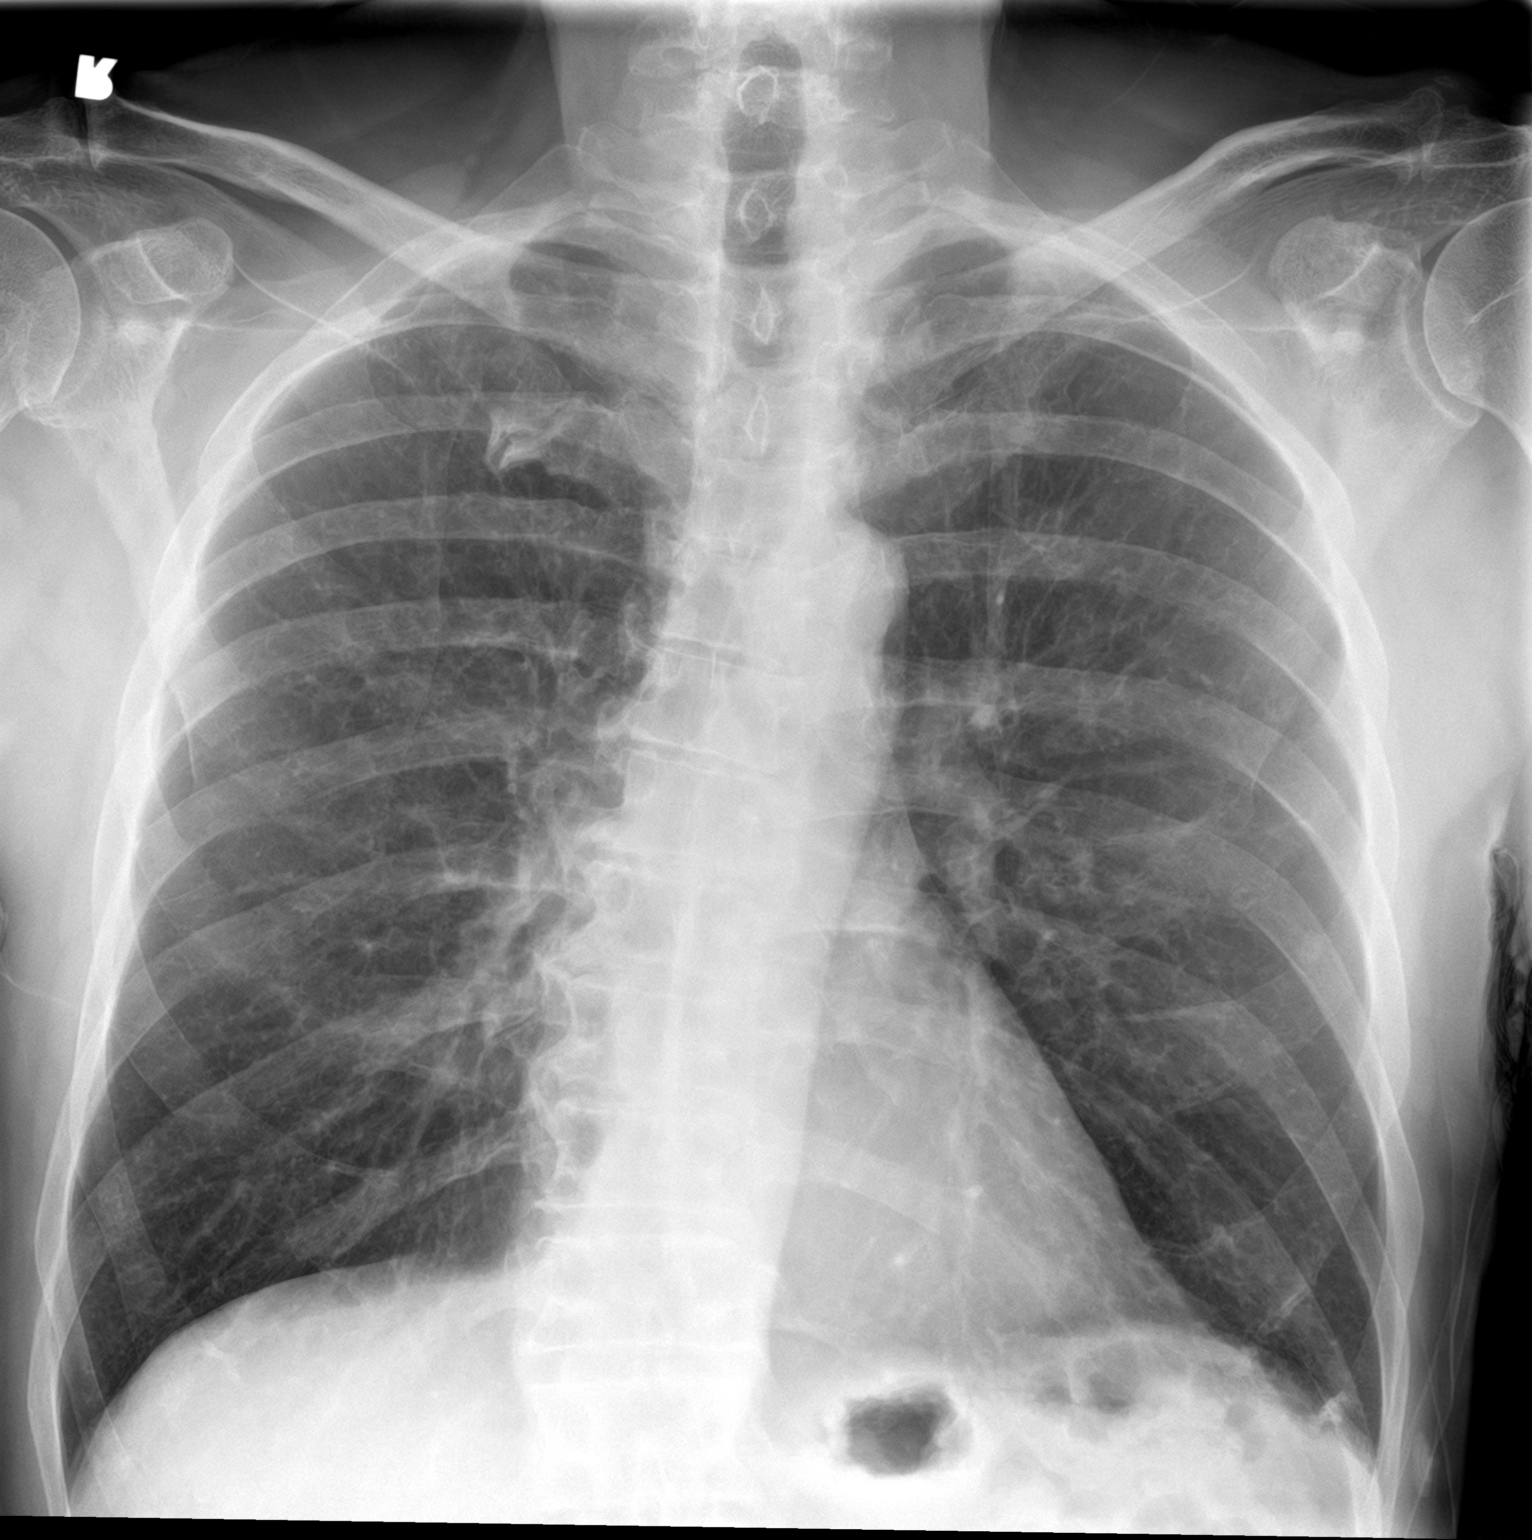

[chest lat]
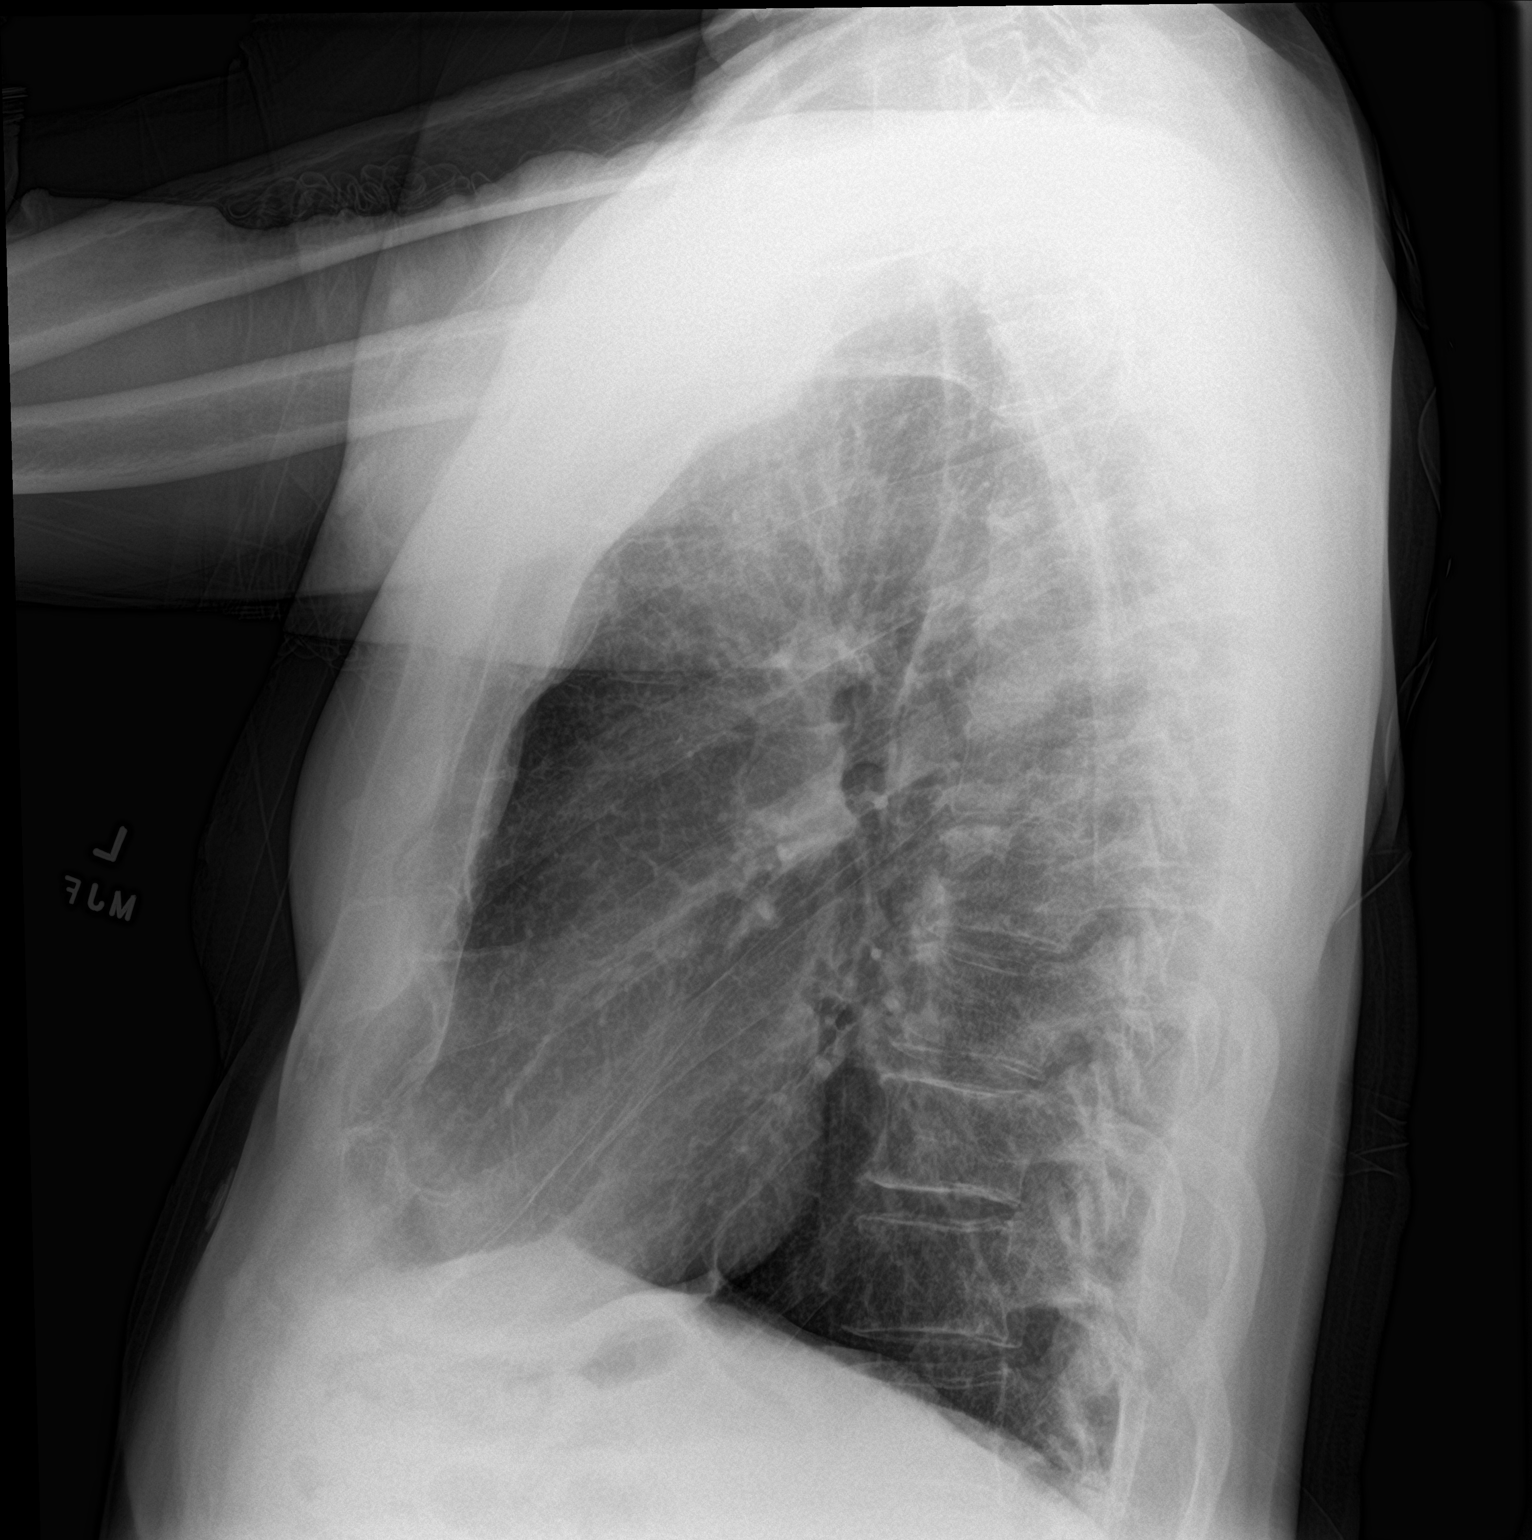

[2 of 2 positions shown; findings below may reference images not displayed]

FINDINGS: Heart size is normal. Mediastinal shadows are normal. No evidence of
active infiltrate, edema, effusion or collapse. There is a 7 mm
nodule projected over the left seventh rib. Previously this was seen
projected over the eighth rib. Therefore, this is not a bony
density. This may be slightly larger than was seen in 9016 if chest
CT does not exist elsewhere, CT would be suggested for further
evaluation.
IMPRESSION: No active cardiopulmonary disease. No cause of acute chest pain
identified.

7 mm nodule projected over the left seventh rib, slightly larger
than was seen in 9016. Chest CT suggested for further evaluation.

## 2022-05-03 ENCOUNTER — Ambulatory Visit (INDEPENDENT_AMBULATORY_CARE_PROVIDER_SITE_OTHER): Payer: Medicare Other | Admitting: Nurse Practitioner

## 2022-05-03 ENCOUNTER — Encounter: Payer: Self-pay | Admitting: Nurse Practitioner

## 2022-05-03 VITALS — BP 157/85 | HR 65 | Temp 97.8°F | Resp 16 | Ht 77.0 in | Wt 230.0 lb

## 2022-05-03 DIAGNOSIS — E538 Deficiency of other specified B group vitamins: Secondary | ICD-10-CM

## 2022-05-03 DIAGNOSIS — R7303 Prediabetes: Secondary | ICD-10-CM | POA: Diagnosis not present

## 2022-05-03 DIAGNOSIS — F17218 Nicotine dependence, cigarettes, with other nicotine-induced disorders: Secondary | ICD-10-CM

## 2022-05-03 DIAGNOSIS — Z1212 Encounter for screening for malignant neoplasm of rectum: Secondary | ICD-10-CM | POA: Diagnosis not present

## 2022-05-03 DIAGNOSIS — F17210 Nicotine dependence, cigarettes, uncomplicated: Secondary | ICD-10-CM | POA: Diagnosis not present

## 2022-05-03 DIAGNOSIS — J449 Chronic obstructive pulmonary disease, unspecified: Secondary | ICD-10-CM | POA: Diagnosis not present

## 2022-05-03 DIAGNOSIS — F321 Major depressive disorder, single episode, moderate: Secondary | ICD-10-CM | POA: Diagnosis not present

## 2022-05-03 DIAGNOSIS — Z0001 Encounter for general adult medical examination with abnormal findings: Secondary | ICD-10-CM

## 2022-05-03 DIAGNOSIS — Z1211 Encounter for screening for malignant neoplasm of colon: Secondary | ICD-10-CM | POA: Diagnosis not present

## 2022-05-03 DIAGNOSIS — E782 Mixed hyperlipidemia: Secondary | ICD-10-CM

## 2022-05-03 DIAGNOSIS — E559 Vitamin D deficiency, unspecified: Secondary | ICD-10-CM

## 2022-05-03 MED ORDER — ALBUTEROL SULFATE HFA 108 (90 BASE) MCG/ACT IN AERS: 2.0000 | INHALATION_SPRAY | Freq: Four times a day (QID) | RESPIRATORY_TRACT | 2 refills | Status: DC | PRN

## 2022-05-03 MED ORDER — ZOSTER VAC RECOMB ADJUVANTED 50 MCG/0.5ML IM SUSR: 0.5000 mL | Freq: Once | INTRAMUSCULAR | 0 refills | Status: AC

## 2022-05-03 MED ORDER — BUPROPION HCL ER (XL) 150 MG PO TB24: 150.0000 mg | ORAL_TABLET | Freq: Every day | ORAL | 3 refills | Status: DC

## 2022-05-03 NOTE — Progress Notes (Signed)
[facility] [place], [place] [address]  Internal MEDICINE  Office Visit Note  Patient Name: Ryan Gallagher  [id number]  VP:[id number]  Date of Service: 05/03/2022  Chief Complaint  Patient presents with   Annual Exam   Gastroesophageal Reflux    HPI Ryan Gallagher presents for an annual well visit and physical exam.  Well-appearing 67 y.o. male with COPD, BPH and arthritis.  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  arthritis Had prostate vaporization done by urology Has a hiatal hernia  And right inguinal hernia that is not causing any issues right now.        05/03/2022    8:40 AM  Depression screen PHQ 2/9  Decreased Interest 1  Down, Depressed, Hopeless 2  PHQ - 2 Score 3  Altered sleeping 1  Tired, decreased energy 1  Change in appetite 1  Feeling bad or failure about yourself  1  Trouble concentrating 3  Moving slowly or fidgety/restless 0  Suicidal thoughts 0  PHQ-9 Score 10  Difficult doing work/chores Not difficult at all   Functional Status Survey: Is the patient deaf or have difficulty hearing?: Yes Does the patient have difficulty seeing, even when wearing glasses/contacts?: Yes Does the patient have difficulty concentrating, remembering, or making decisions?: Yes Does the patient have difficulty walking or climbing stairs?: Yes Does the patient have difficulty dressing or bathing?: No      11/29/2020    7:05 PM 11/30/2020    7:43 AM 03/09/2021    9:21 AM 11/29/2021    8:03 AM 05/03/2022    8:40 AM  Fall Risk  Falls in the past year?     0  Was there an injury with Fall?     0  Fall Risk Category Calculator     0  (RETIRED) Patient Fall Risk Level Low fall risk Low fall risk  Low fall risk Low fall risk   Patient at Risk for Falls Due to     No Fall Risks  Fall risk Follow up     Falls evaluation completed       Current Medication: Outpatient Encounter Medications as of 05/03/2022  Medication Sig   albuterol (VENTOLIN HFA) 108 (90 Base) MCG/ACT inhaler Inhale 2 puffs into the lungs every 6 (six) hours as needed for wheezing or shortness of breath.   aspirin-acetaminophen-caffeine (EXCEDRIN MIGRAINE) 250-250-65 MG per tablet Take 1 tablet by mouth every 6 (six) hours as needed for headache.   buPROPion (WELLBUTRIN XL) 150 MG 24 hr tablet Take 1 tablet (150 mg total) by mouth daily.   calcium carbonate (TUMS - DOSED IN MG ELEMENTAL CALCIUM) 500 MG chewable tablet Chew 1 tablet by mouth as needed for indigestion or heartburn.   diphenhydrAMINE HCl (BENADRYL ALLERGY PO) Take by mouth.   DIPHENHYDRAMINE HCL, TOPICAL, (BENADRYL ITCH STOPPING) 2 % GEL Apply topically.   Magnesium Gluconate 500 (27 Mg) MG TABS Take 500 mg by mouth daily at 6 (six) AM.   Multiple Vitamins-Minerals (MULTIVITAMIN WITH MINERALS) tablet Take 1 tablet by mouth daily.   vitamin B-12 (CYANOCOBALAMIN) 100 MCG tablet Take 100 mcg by mouth daily.   [EXPIRED] Zoster Vaccine Adjuvanted [facility]) injection Inject 0.5 mLs into the muscle once for  1 dose.   No facility-administered encounter medications on file as of 05/03/2022.    Surgical History: Past Surgical History:  Procedure Laterality Date   CATARACT EXTRACTION W/PHACO Left 11/29/2021   Procedure: CATARACT EXTRACTION PHACO AND INTRAOCULAR LENS PLACEMENT (Ryan Gallagher) LEFT;  Surgeon: Birder Robson, MD;  Location: [place];  Service: Ophthalmology;  Laterality: Left;  6.28 1:07.0   HOLEP-LASER ENUCLEATION OF THE PROSTATE WITH MORCELLATION N/A 04/19/2018   Procedure: HOLEP-LASER ENUCLEATION OF THE PROSTATE WITH MORCELLATION;  Surgeon: Billey Co, MD;  Location: [facility];  Service: Urology;  Laterality: N/A;   NO PAST SURGERIES       Medical History: Past Medical History:  Diagnosis Date   Arthritis    Diverticulitis    Enlarged prostate    GERD (gastroesophageal reflux disease)    OCC   Headache    MIGRAINES   History of hiatal hernia     Family History: Family History  Problem Relation Age of Onset   Alzheimer's disease Mother    Alzheimer's disease Father    Diabetes Paternal Uncle    Prostate cancer Neg Hx    Bladder Cancer Neg Hx    Kidney cancer Neg Hx     Social History   Socioeconomic History   Marital status: Single    Spouse name: Not on file   Number of children: Not on file   Years of education: Not on file   Highest education level: Not on file  Occupational History   Not on file  Tobacco Use   Smoking status: Every Day    Packs/day: 0.50    Years: 50.00    Total pack years: 25.00    Types: Cigarettes   Smokeless tobacco: Never  Substance and Sexual Activity   Alcohol use: Yes    Comment: occ   Drug use: Never   Sexual activity: Yes    Birth control/protection: None  Other Topics Concern   Not on file  Social History Narrative   Not on file   Social Determinants of Health   Financial Resource Strain: Not on file  Food Insecurity: Not on file  Transportation Needs: Not on file  Physical Activity: Not on file  Stress: Not on file  Social Connections: Not on file  Intimate Partner Violence: Not on file      Review of Systems  Constitutional:  Negative for chills, diaphoresis, fatigue, fever and unexpected weight change.  HENT: Negative.  Negative for congestion, rhinorrhea, sneezing and sore throat.   Eyes:  Negative for redness.  Respiratory: Negative.  Negative for cough, chest tightness, shortness of breath and wheezing.   Cardiovascular: Negative.  Negative for chest pain and palpitations.  Gastrointestinal:  Negative for abdominal pain, constipation, diarrhea, nausea and vomiting.  Genitourinary:  Negative for dysuria and frequency.  Musculoskeletal:   Positive for arthralgias (chronic bilateral shoulder pain) and neck pain (chronic). Negative for back pain and joint swelling.  Skin:  Positive for rash (see HPI).  Neurological: Negative.  Negative for tremors and numbness.  Hematological:  Negative for adenopathy. Does not bruise/bleed easily.  Psychiatric/Behavioral:  Positive for sleep disturbance (due to neck and shoulder pain). Negative for behavioral problems (Depression), self-injury and suicidal ideas. The patient is not nervous/anxious.     Vital Signs: BP (!) 157/85   Pulse 65   Temp 97.8 F (36.6 C)   Resp 16   Ht 6' 5"$  (1.956 m)   Wt 230 lb (104.3 kg)   SpO2 97%  BMI 27.27 kg/m    Physical Exam Vitals reviewed.  Constitutional:      General: He is not in acute distress.    Appearance: Normal appearance. He is well-developed. He is obese. He is not ill-appearing or diaphoretic.  HENT:     Head: Normocephalic and atraumatic.     Right Ear: Tympanic membrane, ear canal and external ear normal.     Left Ear: Tympanic membrane, ear canal and external ear normal.     Nose: Nose normal. No congestion or rhinorrhea.     Mouth/Throat:     Mouth: Mucous membranes are moist.     Pharynx: Oropharynx is clear. No oropharyngeal exudate or posterior oropharyngeal erythema.  Eyes:     General: No scleral icterus.       Right eye: No discharge.        Left eye: No discharge.     Extraocular Movements: Extraocular movements intact.     Conjunctiva/sclera: Conjunctivae normal.     Pupils: Pupils are equal, round, and reactive to light.  Neck:     Thyroid: No thyromegaly.     Vascular: No JVD.     Trachea: No tracheal deviation.  Cardiovascular:     Rate and Rhythm: Normal rate and regular rhythm.     Pulses: Normal pulses.     Heart sounds: Normal heart sounds. No murmur heard.    No friction rub. No gallop.  Pulmonary:     Effort: Pulmonary effort is normal. No respiratory distress.     Breath sounds: Normal breath  sounds. No stridor. No wheezing or rales.  Chest:     Chest wall: No tenderness.  Abdominal:     General: Bowel sounds are normal. There is no distension.     Palpations: Abdomen is soft. There is no mass.     Tenderness: There is no abdominal tenderness. There is no guarding or rebound.  Musculoskeletal:        General: No tenderness or deformity. Normal range of motion.     Cervical back: Normal range of motion and neck supple.  Lymphadenopathy:     Cervical: No cervical adenopathy.  Skin:    General: Skin is warm and dry.     Capillary Refill: Capillary refill takes less than 2 seconds.     Coloration: Skin is not pale.     Findings: No erythema or rash.  Neurological:     Mental Status: He is alert and oriented to person, place, and time.     Cranial Nerves: No cranial nerve deficit.     Motor: No abnormal muscle tone.     Coordination: Coordination normal.     Gait: Gait normal.     Deep Tendon Reflexes: Reflexes are normal and symmetric.  Psychiatric:        Mood and Affect: Mood is depressed.        Behavior: Behavior normal.        Thought Content: Thought content normal.        Judgment: Judgment normal.        Assessment/Plan: 1. Encounter for routine adult health examination with abnormal findings Age-appropriate preventive screenings and vaccinations discussed, annual physical exam completed. Routine labs for health maintenance ordered, see below. PHM updated. Shingles vaccine due.  - Cologuard - Zoster Vaccine Adjuvanted [facility]) injection; Inject 0.5 mLs into the muscle once for 1 dose.  Dispense: 0.5 mL; Refill: 0 - CBC with Differential/Platelet - CMP14+EGFR - Hgb A1C w/o eAG - TSH +  free T4 - Vitamin D (25 hydroxy) - B12 and Folate Panel - Pulmonary function test; Future  2. Prediabetes Routine labs ordered - CBC with Differential/Platelet - CMP14+EGFR - Hgb A1C w/o eAG - TSH + free T4  3. Chronic obstructive pulmonary disease, unspecified  COPD type ([place]) PFT and rescue inhaler ordered - Pulmonary function test; Future - albuterol (VENTOLIN HFA) 108 (90 Base) MCG/ACT inhaler; Inhale 2 puffs into the lungs every 6 (six) hours as needed for wheezing or shortness of breath.  Dispense: 8 g; Refill: 2  4. Mixed hyperlipidemia Routine lab ordered - TSH + free T4  5. Vitamin D deficiency Routine lab ordered - Vitamin D (25 hydroxy)  6. B12 deficiency Routine labs ordered - B12 and Folate Panel  7. Screening for colorectal cancer Cologuard test ordered - Cologuard  8. Smokes less than 1 pack a day with greater than 20 pack year history CT chest lung cancer screening ordered and bupropion. - CT CHEST LUNG CA SCREEN LOW DOSE W/O CM; Future - buPROPion (WELLBUTRIN XL) 150 MG 24 hr tablet; Take 1 tablet (150 mg total) by mouth daily.  Dispense: 30 tablet; Refill: 3  9. Cigarette nicotine dependence with other nicotine-induced disorder Ct chest lung cancer screening ordered - CT CHEST LUNG CA SCREEN LOW DOSE W/O CM; Future  10. Current moderate episode of major depressive disorder without prior episode ([place]) Added bupropion for depression     General Counseling: angelos ottmar understanding of the findings of todays visit and agrees with plan of treatment. I have discussed any further diagnostic evaluation that may be needed or ordered today. We also reviewed his medications today. he has been encouraged to call the office with any questions or concerns that should arise related to todays visit.    Orders Placed This Encounter  Procedures   CT CHEST LUNG CA SCREEN LOW DOSE W/O CM   Cologuard   CBC with Differential/Platelet   CMP14+EGFR   Hgb A1C w/o eAG   TSH + free T4   Vitamin D (25 hydroxy)   B12 and Folate Panel   Pulmonary function test    Meds ordered this encounter  Medications   Zoster Vaccine Adjuvanted [facility]) injection    Sig: Inject 0.5 mLs into the muscle once for 1 dose.    Dispense:   0.5 mL    Refill:  0   albuterol (VENTOLIN HFA) 108 (90 Base) MCG/ACT inhaler    Sig: Inhale 2 puffs into the lungs every 6 (six) hours as needed for wheezing or shortness of breath.    Dispense:  8 g    Refill:  2   buPROPion (WELLBUTRIN XL) 150 MG 24 hr tablet    Sig: Take 1 tablet (150 mg total) by mouth daily.    Dispense:  30 tablet    Refill:  3    New med, fill script asap    Return in about 1 month (around 06/01/2022) for F/U, Labs, eval new med, PFT @ Randa Ngo PCP.   Total time spent:30 Minutes Time spent includes review of chart, medications, test results, and follow up plan with the patient.   [place] Controlled Substance Database was reviewed by me.  This patient was seen by Jonetta Osgood, FNP-C in collaboration with Dr. Clayborn Bigness as a part of collaborative care agreement.  Kiyoto Slomski R. Valetta Fuller, MSN, FNP-C Internal medicine

## 2022-05-04 LAB — CMP14+EGFR
ALT: 21 IU/L (ref 0–44)
AST: 25 IU/L (ref 0–40)
Albumin/Globulin Ratio: 1.7 (ref 1.2–2.2)
Albumin: 4.5 g/dL (ref 3.9–4.9)
Alkaline Phosphatase: 85 IU/L (ref 44–121)
BUN/Creatinine Ratio: 14 (ref 10–24)
BUN: 13 mg/dL (ref 8–27)
Bilirubin Total: 0.4 mg/dL (ref 0.0–1.2)
CO2: 22 mmol/L (ref 20–29)
Calcium: 9.7 mg/dL (ref 8.6–10.2)
Chloride: 102 mmol/L (ref 96–106)
Creatinine, Ser: 0.91 mg/dL (ref 0.76–1.27)
Globulin, Total: 2.6 g/dL (ref 1.5–4.5)
Glucose: 96 mg/dL (ref 70–99)
Potassium: 4.6 mmol/L (ref 3.5–5.2)
Sodium: 138 mmol/L (ref 134–144)
Total Protein: 7.1 g/dL (ref 6.0–8.5)
eGFR: 92 mL/min/{1.73_m2} (ref >59)

## 2022-05-04 LAB — CBC WITH DIFFERENTIAL/PLATELET
Basophils Absolute: 0.1 10*3/uL (ref 0.0–0.2)
Basos: 1 %
EOS (ABSOLUTE): 0.2 10*3/uL (ref 0.0–0.4)
Eos: 2 %
Hematocrit: 48.3 % (ref 37.5–51.0)
Hemoglobin: 16.3 g/dL (ref 13.0–17.7)
Immature Grans (Abs): 0 10*3/uL (ref 0.0–0.1)
Immature Granulocytes: 0 %
Lymphocytes Absolute: 2 10*3/uL (ref 0.7–3.1)
Lymphs: 18 %
MCH: 31 pg (ref 26.6–33.0)
MCHC: 33.7 g/dL (ref 31.5–35.7)
MCV: 92 fL (ref 79–97)
Monocytes Absolute: 0.7 10*3/uL (ref 0.1–0.9)
Monocytes: 7 %
Neutrophils Absolute: 8.2 10*3/uL — ABNORMAL HIGH (ref 1.4–7.0)
Neutrophils: 72 %
Platelets: 281 10*3/uL (ref 150–450)
RBC: 5.26 x10E6/uL (ref 4.14–5.80)
RDW: 12.8 % (ref 11.6–15.4)
WBC: 11.2 10*3/uL — ABNORMAL HIGH (ref 3.4–10.8)

## 2022-05-04 LAB — VITAMIN D 25 HYDROXY (VIT D DEFICIENCY, FRACTURES): Vit D, 25-Hydroxy: 75.7 ng/mL (ref 30.0–100.0)

## 2022-05-04 LAB — HGB A1C W/O EAG: Hgb A1c MFr Bld: 5.9 % — ABNORMAL HIGH (ref 4.8–5.6)

## 2022-05-04 LAB — TSH+FREE T4
Free T4: 1.12 ng/dL (ref 0.82–1.77)
TSH: 0.838 u[IU]/mL (ref 0.450–4.500)

## 2022-05-04 LAB — B12 AND FOLATE PANEL
Folate: >20 ng/mL
Vitamin B-12: 1579 pg/mL — ABNORMAL HIGH (ref 232–1245)

## 2022-05-10 ENCOUNTER — Telehealth: Payer: Self-pay | Admitting: Nurse Practitioner

## 2022-05-10 NOTE — Telephone Encounter (Signed)
Left vm and sent mychart message to confirm 05/17/22 appointment-Ryan Gallagher

## 2022-05-16 DIAGNOSIS — Z1211 Encounter for screening for malignant neoplasm of colon: Secondary | ICD-10-CM | POA: Diagnosis not present

## 2022-05-16 DIAGNOSIS — Z1212 Encounter for screening for malignant neoplasm of rectum: Secondary | ICD-10-CM | POA: Diagnosis not present

## 2022-05-17 ENCOUNTER — Ambulatory Visit (INDEPENDENT_AMBULATORY_CARE_PROVIDER_SITE_OTHER): Payer: Medicare Other | Admitting: Internal Medicine

## 2022-05-17 DIAGNOSIS — Z0001 Encounter for general adult medical examination with abnormal findings: Secondary | ICD-10-CM | POA: Diagnosis not present

## 2022-05-17 DIAGNOSIS — J449 Chronic obstructive pulmonary disease, unspecified: Secondary | ICD-10-CM

## 2022-05-26 LAB — COLOGUARD: COLOGUARD: POSITIVE — AB

## 2022-05-28 NOTE — Procedures (Signed)
[facility] [place], [address]    Complete Pulmonary Function Testing Interpretation:  FINDINGS:  The forced vital capacity is mildly decreased FEV1 is 2.72 L which is 68% predicted and is mildly decreased.  F1 FVC ratio is decreased.  Postbronchodilator no significant change in FEV1 is noted.  Total lung capacity is normal.  Residual volume is increased.  FRC is decreased.  The DLCO was mildly decreased.  IMPRESSION:  This pulmonary function study is consistent with a mild obstructive lung disease clinical correlation is recommended  Allyne Gee, MD [facility] Pulmonary Critical Care Medicine Sleep Medicine

## 2022-05-29 ENCOUNTER — Ambulatory Visit
Admission: RE | Admit: 2022-05-29 | Discharge: 2022-05-29 | Disposition: A | Discharge status: Home / Self Care | Payer: Medicare Other | Source: Ambulatory Visit | Attending: Nurse Practitioner | Admitting: Nurse Practitioner

## 2022-05-29 DIAGNOSIS — F17218 Nicotine dependence, cigarettes, with other nicotine-induced disorders: Secondary | ICD-10-CM

## 2022-05-29 DIAGNOSIS — F17210 Nicotine dependence, cigarettes, uncomplicated: Secondary | ICD-10-CM

## 2022-05-29 NOTE — Progress Notes (Signed)
We will discuss all labs at upcoming office visit on 3/11

## 2022-05-30 LAB — PULMONARY FUNCTION TEST

## 2022-05-30 NOTE — Progress Notes (Signed)
Will discuss results at next office visit

## 2022-05-31 ENCOUNTER — Ambulatory Visit: Payer: Medicare Other | Admitting: Nurse Practitioner

## 2022-06-05 ENCOUNTER — Encounter: Payer: Self-pay | Admitting: Nurse Practitioner

## 2022-06-05 ENCOUNTER — Ambulatory Visit (INDEPENDENT_AMBULATORY_CARE_PROVIDER_SITE_OTHER): Payer: Medicare Other | Admitting: Nurse Practitioner

## 2022-06-05 VITALS — BP 125/72 | HR 82 | Temp 97.6°F | Resp 16 | Ht 77.0 in | Wt 230.6 lb

## 2022-06-05 DIAGNOSIS — R195 Other fecal abnormalities: Secondary | ICD-10-CM

## 2022-06-05 DIAGNOSIS — S59902A Unspecified injury of left elbow, initial encounter: Secondary | ICD-10-CM

## 2022-06-05 DIAGNOSIS — I2510 Atherosclerotic heart disease of native coronary artery without angina pectoris: Secondary | ICD-10-CM

## 2022-06-05 DIAGNOSIS — M159 Polyosteoarthritis, unspecified: Secondary | ICD-10-CM

## 2022-06-05 DIAGNOSIS — I77810 Thoracic aortic ectasia: Secondary | ICD-10-CM | POA: Diagnosis not present

## 2022-06-05 DIAGNOSIS — J432 Centrilobular emphysema: Secondary | ICD-10-CM

## 2022-06-05 DIAGNOSIS — F320 Major depressive disorder, single episode, mild: Secondary | ICD-10-CM

## 2022-06-05 MED ORDER — TRELEGY ELLIPTA 100-62.5-25 MCG/ACT IN AEPB: 1.0000 | INHALATION_SPRAY | Freq: Every day | RESPIRATORY_TRACT | 11 refills | Status: DC

## 2022-06-05 MED ORDER — MELOXICAM 7.5 MG PO TABS: 7.5000 mg | ORAL_TABLET | Freq: Every day | ORAL | 3 refills | Status: DC

## 2022-06-05 MED ORDER — ESCITALOPRAM OXALATE 5 MG PO TABS: 5.0000 mg | ORAL_TABLET | Freq: Every day | ORAL | 2 refills | Status: DC

## 2022-06-05 NOTE — Progress Notes (Signed)
[facility] [place], [place] [address]  Internal MEDICINE  Office Visit Note  Patient Name: Ryan Gallagher  [id number]  VP:[id number]  Date of Service: 06/05/2022  Chief Complaint  Patient presents with   Follow-up    Review test.     HPI Ryan Gallagher presents for a follow-up visit to review results of labs and tests Reviewed labs, PFT, and CT chest results with patient and spouse.  CAD -- left main and 3 vessel seen on CT chest, unknown severity  Thoracic aortic ectasia -- seen on CT chest -- measured 4.4 cm Emphysema -- mild centrilobular. Not on maintenance inhaler.  Left elbow injury -- tree limb fell on forearm, with signficant bruising, elbow still hurting esp at night,. Initially happened a few weeks ago.  Positive cologuard -- need follow up colonoscopy.  Having moderate to severe joint pains, thinks it is arthritis -- wants to try medication for arthritis and see if it helps.  Depression -- taking bupropion since it can also decrease nicotine cravings but states that his depressive symptoms are not improving and he still wants to stay in bed and doesn't care about doing anything.    Current Medication: Outpatient Encounter Medications as of 06/05/2022  Medication Sig   albuterol (VENTOLIN HFA) 108 (90 Base) MCG/ACT inhaler Inhale 2 puffs into the lungs every 6 (six) hours as needed for wheezing or shortness of breath.   aspirin-acetaminophen-caffeine (EXCEDRIN MIGRAINE) 250-250-65 MG per tablet Take 1 tablet by mouth every 6 (six) hours as needed for headache.   calcium carbonate (TUMS - DOSED IN MG ELEMENTAL CALCIUM) 500 MG chewable tablet Chew 1 tablet by mouth as needed for indigestion or heartburn.   diphenhydrAMINE HCl (BENADRYL ALLERGY PO) Take by mouth.   DIPHENHYDRAMINE HCL, TOPICAL, (BENADRYL ITCH STOPPING) 2 % GEL Apply topically.   escitalopram (LEXAPRO) 5 MG tablet Take 1 tablet (5 mg total) by mouth daily.   Fluticasone-Umeclidin-Vilant  (TRELEGY ELLIPTA) 100-62.5-25 MCG/ACT AEPB Inhale 1 puff into the lungs daily.   Magnesium Gluconate 500 (27 Mg) MG TABS Take 500 mg by mouth daily at 6 (six) AM.   meloxicam (MOBIC) 7.5 MG tablet Take 1 tablet (7.5 mg total) by mouth daily.   Multiple Vitamins-Minerals (MULTIVITAMIN WITH MINERALS) tablet Take 1 tablet by mouth daily.   vitamin B-12 (CYANOCOBALAMIN) 100 MCG tablet Take 100 mcg by mouth daily.   [DISCONTINUED] buPROPion (WELLBUTRIN XL) 150 MG 24 hr tablet Take 1 tablet (150 mg total) by mouth daily.   No facility-administered encounter medications on file as of 06/05/2022.    Surgical History: Past Surgical History:  Procedure Laterality Date   CATARACT EXTRACTION W/PHACO Left 11/29/2021   Procedure: CATARACT EXTRACTION PHACO AND INTRAOCULAR LENS PLACEMENT ([place]) LEFT;  Surgeon: Birder Robson, MD;  Location: [place];  Service: Ophthalmology;  Laterality: Left;  6.28 1:07.0   HOLEP-LASER ENUCLEATION OF THE PROSTATE WITH MORCELLATION N/A 04/19/2018   Procedure: HOLEP-LASER ENUCLEATION OF THE PROSTATE WITH MORCELLATION;  Surgeon: Billey Co, MD;  Location: [facility];  Service: Urology;  Laterality: N/A;   NO PAST SURGERIES      Medical History: Past Medical History:  Diagnosis Date   Arthritis    Diverticulitis    Enlarged prostate    GERD (gastroesophageal reflux disease)    OCC   Headache    MIGRAINES   History of hiatal hernia     Family History: Family History  Problem Relation Age of Onset   Alzheimer's disease  Mother    Alzheimer's disease Father    Diabetes Paternal Uncle    Prostate cancer Neg Hx    Bladder Cancer Neg Hx    Kidney cancer Neg Hx     Social History   Socioeconomic History   Marital status: Single    Spouse name: Not on file   Number of children: Not on file   Years of education: Not on file   Highest education level: Not on file  Occupational History   Not on file  Tobacco Use   Smoking status: Every Day     Packs/day: 0.50    Years: 50.00    Total pack years: 25.00    Types: Cigarettes   Smokeless tobacco: Never  Substance and Sexual Activity   Alcohol use: Yes    Comment: occ   Drug use: Never   Sexual activity: Yes    Birth control/protection: None  Other Topics Concern   Not on file  Social History Narrative   Not on file   Social Determinants of Health   Financial Resource Strain: Not on file  Food Insecurity: Not on file  Transportation Needs: Not on file  Physical Activity: Not on file  Stress: Not on file  Social Connections: Not on file  Intimate Partner Violence: Not on file      Review of Systems  Constitutional:  Positive for activity change and fatigue. Negative for chills, diaphoresis, fever and unexpected weight change.  HENT: Negative.  Negative for congestion, rhinorrhea, sneezing and sore throat.   Eyes:  Negative for redness.  Respiratory:  Positive for shortness of breath (easily winded with minimal activity). Negative for cough, chest tightness and wheezing.   Cardiovascular: Negative.  Negative for chest pain and palpitations.  Gastrointestinal:  Negative for abdominal pain, constipation, diarrhea, nausea and vomiting.  Genitourinary:  Negative for dysuria and frequency.  Musculoskeletal:  Positive for arthralgias (chronic bilateral shoulder pain) and neck pain (chronic). Negative for back pain and joint swelling.  Skin:  Negative for rash.  Neurological: Negative.  Negative for tremors and numbness.  Hematological:  Negative for adenopathy. Does not bruise/bleed easily.  Psychiatric/Behavioral:  Positive for sleep disturbance (due to neck and shoulder pain). Negative for behavioral problems (Depression), self-injury and suicidal ideas. The patient is not nervous/anxious.     Vital Signs: BP 125/72   Pulse 82   Temp 97.6 F (36.4 C)   Resp 16   Ht '6\' 5"'$  (1.956 m)   Wt 230 lb 9.6 oz (104.6 kg)   SpO2 96%   BMI 27.35 kg/m    Physical  Exam Vitals reviewed.  Constitutional:      General: He is not in acute distress.    Appearance: Normal appearance. He is not ill-appearing.  HENT:     Head: Normocephalic and atraumatic.  Eyes:     Pupils: Pupils are equal, round, and reactive to light.  Cardiovascular:     Rate and Rhythm: Normal rate and regular rhythm.  Pulmonary:     Effort: Pulmonary effort is normal. No respiratory distress.  Neurological:     Mental Status: He is alert and oriented to person, place, and time.  Psychiatric:        Mood and Affect: Mood normal.        Behavior: Behavior normal.        Assessment/Plan: 1. 3-vessel coronary artery disease Referred to cardiology for further evaluation esp with unknown severity of CAD - Ambulatory referral to Cardiology  2. Left main coronary artery disease Referred to cardiology for further evaluation - Ambulatory referral to Cardiology  3. Centrilobular emphysema ([place]) 4 week sample of trelegy given to patient. Prescribed trelegy but unsure which inhaler is preferred by insurance, will see if it gets approved.  - Fluticasone-Umeclidin-Vilant (TRELEGY ELLIPTA) 100-62.5-25 MCG/ACT AEPB; Inhale 1 puff into the lungs daily.  Dispense: 1 each; Refill: 11  4. Thoracic aortic ectasia (HCC) No interventions at this time, repeat CT chest in 1 year   5. Elbow injury, left, initial encounter Xray ordered to rule out fracture or other acute injury - DG Elbow Complete Left; Future  6. Generalized osteoarthritis Will try meloxicam, follow up in 4 weeks - meloxicam (MOBIC) 7.5 MG tablet; Take 1 tablet (7.5 mg total) by mouth daily.  Dispense: 30 tablet; Refill: 3  7. Positive colorectal cancer screening using Cologuard test Referred to GI for follow up colonoscopy.  - Ambulatory referral to Gastroenterology  8. Depression, major, single episode, mild (HCC) Discontinue bupropion, start lexapro as prescribed. Follow up in 4 weeks - escitalopram (LEXAPRO) 5  MG tablet; Take 1 tablet (5 mg total) by mouth daily.  Dispense: 30 tablet; Refill: 2   General Counseling: Ryan Gallagher understanding of the findings of todays visit and agrees with plan of treatment. I have discussed any further diagnostic evaluation that may be needed or ordered today. We also reviewed his medications today. he has been encouraged to call the office with any questions or concerns that should arise related to todays visit.    Orders Placed This Encounter  Procedures   DG Elbow Complete Left   Ambulatory referral to Gastroenterology   Ambulatory referral to Cardiology    Meds ordered this encounter  Medications   Fluticasone-Umeclidin-Vilant (TRELEGY ELLIPTA) 100-62.5-25 MCG/ACT AEPB    Sig: Inhale 1 puff into the lungs daily.    Dispense:  1 each    Refill:  11   meloxicam (MOBIC) 7.5 MG tablet    Sig: Take 1 tablet (7.5 mg total) by mouth daily.    Dispense:  30 tablet    Refill:  3   escitalopram (LEXAPRO) 5 MG tablet    Sig: Take 1 tablet (5 mg total) by mouth daily.    Dispense:  30 tablet    Refill:  2    Return in about 4 weeks (around 07/03/2022) for F/U, eval new med, Anxiety/depression, Shruti Arrey PCP.   Total time spent:30 Minutes Time spent includes review of chart, medications, test results, and follow up plan with the patient.   [place] Controlled Substance Database was reviewed by me.  This patient was seen by Jonetta Osgood, FNP-C in collaboration with Dr. Clayborn Bigness as a part of collaborative care agreement.   Clarence Cogswell R. Valetta Fuller, MSN, FNP-C Internal medicine

## 2022-06-06 ENCOUNTER — Encounter: Payer: Self-pay | Admitting: Nurse Practitioner

## 2022-06-08 ENCOUNTER — Telehealth: Payer: Self-pay

## 2022-06-08 DIAGNOSIS — R195 Other fecal abnormalities: Secondary | ICD-10-CM

## 2022-06-08 DIAGNOSIS — Z1211 Encounter for screening for malignant neoplasm of colon: Secondary | ICD-10-CM

## 2022-06-08 NOTE — Telephone Encounter (Signed)
Gastroenterology Pre-Procedure Review  Request Date: TBD Requesting Physician: Dr. Dellie Catholic  PATIENT REVIEW QUESTIONS: The patient responded to the following health history questions as indicated:    1. Are you having any GI issues? No current issues.  Referral noted positive cologuard however this is patients first colonoscopy.  Does have history of diverticulitis 2. Do you have a personal history of Polyps? no 3. Do you have a family history of Colon Cancer or Polyps? no 4. Diabetes Mellitus? no 5. Joint replacements in the past 12 months?no 6. Major health problems in the past 3 months?no 7. Any artificial heart valves, MVP, or defibrillator?no    MEDICATIONS & ALLERGIES:    Patient reports the following regarding taking any anticoagulation/antiplatelet therapy:   Plavix, Coumadin, Eliquis, Xarelto, Lovenox, Pradaxa, Brilinta, or Effient? no Aspirin? no  Patient confirms/reports the following medications:  Current Outpatient Medications  Medication Sig Dispense Refill   albuterol (VENTOLIN HFA) 108 (90 Base) MCG/ACT inhaler Inhale 2 puffs into the lungs every 6 (six) hours as needed for wheezing or shortness of breath. 8 g 2   aspirin-acetaminophen-caffeine (EXCEDRIN MIGRAINE) [id number] MG per tablet Take 1 tablet by mouth every 6 (six) hours as needed for headache.     calcium carbonate (TUMS - DOSED IN MG ELEMENTAL CALCIUM) 500 MG chewable tablet Chew 1 tablet by mouth as needed for indigestion or heartburn.     diphenhydrAMINE HCl (BENADRYL ALLERGY PO) Take by mouth.     DIPHENHYDRAMINE HCL, TOPICAL, (BENADRYL ITCH STOPPING) 2 % GEL Apply topically.     escitalopram (LEXAPRO) 5 MG tablet Take 1 tablet (5 mg total) by mouth daily. 30 tablet 2   Fluticasone-Umeclidin-Vilant (TRELEGY ELLIPTA) 100-62.5-25 MCG/ACT AEPB Inhale 1 puff into the lungs daily. 1 each 11   Magnesium Gluconate 500 (27 Mg) MG TABS Take 500 mg by mouth daily at 6 (six) AM.     meloxicam (MOBIC) 7.5 MG tablet Take 1  tablet (7.5 mg total) by mouth daily. 30 tablet 3   Multiple Vitamins-Minerals (MULTIVITAMIN WITH MINERALS) tablet Take 1 tablet by mouth daily.     vitamin B-12 (CYANOCOBALAMIN) 100 MCG tablet Take 100 mcg by mouth daily.     No current facility-administered medications for this visit.    Patient confirms/reports the following allergies:  No Known Allergies  No orders of the defined types were placed in this encounter.   AUTHORIZATION INFORMATION Primary Insurance: 1D#: Group #:  Secondary Insurance: 1D#: Group #:  SCHEDULE INFORMATION: Date: TBD Time: Location: [place]

## 2022-06-08 NOTE — Telephone Encounter (Signed)
Contacted Dr. Tyrell Antonio office to inquire if cardiac clearance could be sent over prior to patient being scheduled his new patient appt.  Jeanette Caprice said yes I could go ahead and fax over the cardiac clearance.  Clearance will be sent to [facility] Dr. Fletcher Anon.  Thanks,  [place], [place]

## 2022-07-03 ENCOUNTER — Encounter: Payer: Self-pay | Admitting: Nurse Practitioner

## 2022-07-03 ENCOUNTER — Ambulatory Visit (INDEPENDENT_AMBULATORY_CARE_PROVIDER_SITE_OTHER): Payer: Medicare Other | Admitting: Nurse Practitioner

## 2022-07-03 VITALS — BP 115/72 | HR 70 | Temp 97.7°F | Resp 16 | Ht 77.0 in | Wt 227.8 lb

## 2022-07-03 DIAGNOSIS — F320 Major depressive disorder, single episode, mild: Secondary | ICD-10-CM

## 2022-07-03 DIAGNOSIS — M159 Polyosteoarthritis, unspecified: Secondary | ICD-10-CM | POA: Diagnosis not present

## 2022-07-03 DIAGNOSIS — S29012A Strain of muscle and tendon of back wall of thorax, initial encounter: Secondary | ICD-10-CM

## 2022-07-03 DIAGNOSIS — J432 Centrilobular emphysema: Secondary | ICD-10-CM | POA: Diagnosis not present

## 2022-07-03 MED ORDER — ESCITALOPRAM OXALATE 10 MG PO TABS: 10.0000 mg | ORAL_TABLET | Freq: Every day | ORAL | 2 refills | Status: DC

## 2022-07-03 MED ORDER — MELOXICAM 7.5 MG PO TABS: 7.5000 mg | ORAL_TABLET | Freq: Every day | ORAL | 3 refills | Status: DC

## 2022-07-03 MED ORDER — METHOCARBAMOL 500 MG PO TABS: 500.0000 mg | ORAL_TABLET | Freq: Three times a day (TID) | ORAL | 0 refills | Status: DC | PRN

## 2022-07-03 NOTE — Progress Notes (Signed)
[facility] [address] [place], [place] [address]  Internal MEDICINE  Office Visit Note  Patient Name: Ryan Gallagher  [id number]  [id number]  Date of Service: 07/03/2022  Chief Complaint  Patient presents with   Gastroesophageal Reflux   Follow-up    HPI Iantha FallenKenneth presents for a follow-up visit for medication adjustments Depression -- on lexapro 5 mg daily, still feels sluggish.  Pulled muscle in back, but states meloxicam is helping with joint pains.  Inhaler is too expensive  Need to find alternative.  Allergies -- zyrtec causes drowsiness, try claritin.  Cardiology appt in may -- scope on hold until cleared by cardio.     Current Medication: Outpatient Encounter Medications as of 07/03/2022  Medication Sig   albuterol (VENTOLIN HFA) 108 (90 Base) MCG/ACT inhaler Inhale 2 puffs into the lungs every 6 (six) hours as needed for wheezing or shortness of breath.   aspirin-acetaminophen-caffeine (EXCEDRIN MIGRAINE) 250-250-65 MG per tablet Take 1 tablet by mouth every 6 (six) hours as needed for headache.   calcium carbonate (TUMS - DOSED IN MG ELEMENTAL CALCIUM) 500 MG chewable tablet Chew 1 tablet by mouth as needed for indigestion or heartburn.   diphenhydrAMINE HCl (BENADRYL ALLERGY PO) Take by mouth.   DIPHENHYDRAMINE HCL, TOPICAL, (BENADRYL ITCH STOPPING) 2 % GEL Apply topically.   escitalopram (LEXAPRO) 10 MG tablet Take 1 tablet (10 mg total) by mouth daily.   Fluticasone-Umeclidin-Vilant (TRELEGY ELLIPTA) 100-62.5-25 MCG/ACT AEPB Inhale 1 puff into the lungs daily.   Magnesium Gluconate 500 (27 Mg) MG TABS Take 500 mg by mouth daily at 6 (six) AM.   methocarbamol (ROBAXIN) 500 MG tablet Take 1 tablet (500 mg total) by mouth every 8 (eight) hours as needed for muscle spasms.   Multiple Vitamins-Minerals (MULTIVITAMIN WITH MINERALS) tablet Take 1 tablet by mouth daily.   vitamin B-12 (CYANOCOBALAMIN) 100 MCG tablet Take 100 mcg by mouth daily.   [DISCONTINUED]  escitalopram (LEXAPRO) 5 MG tablet Take 1 tablet (5 mg total) by mouth daily.   [DISCONTINUED] meloxicam (MOBIC) 7.5 MG tablet Take 1 tablet (7.5 mg total) by mouth daily.   meloxicam (MOBIC) 7.5 MG tablet Take 1 tablet (7.5 mg total) by mouth daily.   No facility-administered encounter medications on file as of 07/03/2022.    Surgical History: Past Surgical History:  Procedure Laterality Date   CATARACT EXTRACTION W/PHACO Left 11/29/2021   Procedure: CATARACT EXTRACTION PHACO AND INTRAOCULAR LENS PLACEMENT (IOC) LEFT;  Surgeon: Galen ManilaPorfilio, William, MD;  [facility];  Service: Ophthalmology;  Laterality: Left;  6.28 1:07.0   HOLEP-LASER ENUCLEATION OF THE PROSTATE WITH MORCELLATION N/A 04/19/2018   Procedure: HOLEP-LASER ENUCLEATION OF THE PROSTATE WITH MORCELLATION;  Surgeon: Sondra ComeSninsky, Brian C, MD;  Location: [facility];  Service: Urology;  Laterality: N/A;   NO PAST SURGERIES      Medical History: Past Medical History:  Diagnosis Date   Arthritis    Diverticulitis    Enlarged prostate    GERD (gastroesophageal reflux disease)    OCC   Headache    MIGRAINES   History of hiatal hernia     Family History: Family History  Problem Relation Age of Onset   Alzheimer's disease Mother    Alzheimer's disease Father    Diabetes Paternal Uncle    Prostate cancer Neg Hx    Bladder Cancer Neg Hx    Kidney cancer Neg Hx     Social History   Socioeconomic History   Marital status: Single  Spouse name: Not on file   Number of children: Not on file   Years of education: Not on file   Highest education level: Not on file  Occupational History   Not on file  Tobacco Use   Smoking status: Every Day    Packs/day: 0.50    Years: 50.00    Additional pack years: 0.00    Total pack years: 25.00    Types: Cigarettes   Smokeless tobacco: Never  Substance and Sexual Activity   Alcohol use: Yes    Comment: occ   Drug use: Never   Sexual activity: Yes    Birth  control/protection: None  Other Topics Concern   Not on file  Social History Narrative   Not on file   Social Determinants of Health   Financial Resource Strain: Not on file  Food Insecurity: Not on file  Transportation Needs: Not on file  Physical Activity: Not on file  Stress: Not on file  Social Connections: Not on file  Intimate Partner Violence: Not on file      Review of Systems  Constitutional:  Positive for activity change and fatigue. Negative for chills, diaphoresis, fever and unexpected weight change.  HENT: Negative.  Negative for congestion, rhinorrhea, sneezing and sore throat.   Eyes:  Negative for redness.  Respiratory:  Positive for shortness of breath (easily winded with minimal activity). Negative for cough, chest tightness and wheezing.   Cardiovascular: Negative.  Negative for chest pain and palpitations.  Gastrointestinal:  Negative for abdominal pain, constipation, diarrhea, nausea and vomiting.  Genitourinary:  Negative for dysuria and frequency.  Musculoskeletal:  Positive for arthralgias (chronic bilateral shoulder pain) and neck pain (chronic). Negative for back pain and joint swelling.  Skin:  Negative for rash.  Neurological: Negative.  Negative for tremors and numbness.  Hematological:  Negative for adenopathy. Does not bruise/bleed easily.  Psychiatric/Behavioral:  Positive for sleep disturbance (due to neck and shoulder pain). Negative for behavioral problems (Depression), self-injury and suicidal ideas. The patient is not nervous/anxious.     Vital Signs: BP 115/72   Pulse 70   Temp 97.7 F (36.5 C)   Resp 16   Ht 6\' 5"  (1.956 m)   Wt 227 lb 12.8 oz (103.3 kg)   SpO2 95%   BMI 27.01 kg/m    Physical Exam Vitals reviewed.  Constitutional:      General: He is not in acute distress.    Appearance: Normal appearance. He is not ill-appearing.  HENT:     Head: Normocephalic and atraumatic.  Eyes:     Pupils: Pupils are equal, round,  and reactive to light.  Cardiovascular:     Rate and Rhythm: Normal rate and regular rhythm.  Pulmonary:     Effort: Pulmonary effort is normal. No respiratory distress.  Neurological:     Mental Status: He is alert and oriented to person, place, and time.  Psychiatric:        Mood and Affect: Mood normal.        Behavior: Behavior normal.        Assessment/Plan: 1. Centrilobular emphysema Trelegy helps but is too expensive. Need to find an alternative.   2. Generalized osteoarthritis Meloxicam is helping with joint pain, continue as prescribed.  - meloxicam (MOBIC) 7.5 MG tablet; Take 1 tablet (7.5 mg total) by mouth daily.  Dispense: 30 tablet; Refill: 3  3. Strain of mid-back, initial encounter Try methocarbamol as needed - methocarbamol (ROBAXIN) 500 MG tablet; Take  1 tablet (500 mg total) by mouth every 8 (eight) hours as needed for muscle spasms.  Dispense: 90 tablet; Refill: 0  4. Depression, major, single episode, mild Escitalopram dose increased to 10 mg daily. Take medication as prescribed. - escitalopram (LEXAPRO) 10 MG tablet; Take 1 tablet (10 mg total) by mouth daily.  Dispense: 30 tablet; Refill: 2   General Counseling: brendt dible understanding of the findings of todays visit and agrees with plan of treatment. I have discussed any further diagnostic evaluation that may be needed or ordered today. We also reviewed his medications today. he has been encouraged to call the office with any questions or concerns that should arise related to todays visit.    No orders of the defined types were placed in this encounter.   Meds ordered this encounter  Medications   escitalopram (LEXAPRO) 10 MG tablet    Sig: Take 1 tablet (10 mg total) by mouth daily.    Dispense:  30 tablet    Refill:  2    Note increased dose, discontinue 5 mg tablet, fill new script now.   methocarbamol (ROBAXIN) 500 MG tablet    Sig: Take 1 tablet (500 mg total) by mouth every 8  (eight) hours as needed for muscle spasms.    Dispense:  90 tablet    Refill:  0    Fill script now.   meloxicam (MOBIC) 7.5 MG tablet    Sig: Take 1 tablet (7.5 mg total) by mouth daily.    Dispense:  30 tablet    Refill:  3    Return in about 1 month (around 08/02/2022) for F/U increased lexapro dose, reevaluate next visit , Wannetta Langland PCP.   Total time spent:30 Minutes Time spent includes review of chart, medications, test results, and follow up plan with the patient.   [place] Controlled Substance Database was reviewed by me.  This patient was seen by Sallyanne Kuster, FNP-C in collaboration with Dr. Beverely Risen as a part of collaborative care agreement.   Leray Garverick R. Tedd Sias, MSN, FNP-C Internal medicine

## 2022-07-04 ENCOUNTER — Encounter: Payer: Self-pay | Admitting: Nurse Practitioner

## 2022-07-12 ENCOUNTER — Encounter: Payer: Self-pay | Admitting: *Deleted

## 2022-07-28 DIAGNOSIS — H2511 Age-related nuclear cataract, right eye: Secondary | ICD-10-CM | POA: Diagnosis not present

## 2022-07-28 DIAGNOSIS — H179 Unspecified corneal scar and opacity: Secondary | ICD-10-CM | POA: Diagnosis not present

## 2022-08-02 ENCOUNTER — Ambulatory Visit: Payer: Medicare Other | Admitting: Nurse Practitioner

## 2022-08-09 ENCOUNTER — Encounter: Payer: Self-pay | Admitting: Nurse Practitioner

## 2022-08-09 ENCOUNTER — Ambulatory Visit (INDEPENDENT_AMBULATORY_CARE_PROVIDER_SITE_OTHER): Payer: Medicare Other | Admitting: Nurse Practitioner

## 2022-08-09 VITALS — BP 120/80 | HR 63 | Temp 97.6°F | Resp 16 | Ht 77.0 in | Wt 224.8 lb

## 2022-08-09 DIAGNOSIS — M159 Polyosteoarthritis, unspecified: Secondary | ICD-10-CM | POA: Diagnosis not present

## 2022-08-09 DIAGNOSIS — F320 Major depressive disorder, single episode, mild: Secondary | ICD-10-CM

## 2022-08-09 DIAGNOSIS — J432 Centrilobular emphysema: Secondary | ICD-10-CM

## 2022-08-09 MED ORDER — METHOCARBAMOL 500 MG PO TABS: 500.0000 mg | ORAL_TABLET | Freq: Three times a day (TID) | ORAL | 0 refills | Status: AC | PRN

## 2022-08-09 MED ORDER — ESCITALOPRAM OXALATE 20 MG PO TABS: 20.0000 mg | ORAL_TABLET | Freq: Every day | ORAL | 2 refills | Status: DC

## 2022-08-09 NOTE — Progress Notes (Signed)
[facility] [address] [place], [place] [address]  Internal MEDICINE  Office Visit Note  Patient Name: Ryan Gallagher  [id number]  [id number]  Date of Service: 08/09/2022  Chief Complaint  Patient presents with   Gastroesophageal Reflux   Follow-up    HPI [name] presents for a follow-up visit for depression, arthritis, COPD.  Depression -- taking lexapro 10mg , does not seem to be helping much per aptient  Arthritis -- pain and aching joints has decreased on current dose of meloxicam and methocarbamol  COPD -- trelegy is too expensive. Still has the samples that he received previously. Need to find a more affordable inhaler for long-term.     Current Medication: Outpatient Encounter Medications as of 08/09/2022  Medication Sig   escitalopram (LEXAPRO) 20 MG tablet Take 1 tablet (20 mg total) by mouth daily.   albuterol (VENTOLIN HFA) 108 (90 Base) MCG/ACT inhaler Inhale 2 puffs into the lungs every 6 (six) hours as needed for wheezing or shortness of breath.   aspirin-acetaminophen-caffeine (EXCEDRIN MIGRAINE) 250-250-65 MG per tablet Take 1 tablet by mouth every 6 (six) hours as needed for headache.   calcium carbonate (TUMS - DOSED IN MG ELEMENTAL CALCIUM) 500 MG chewable tablet Chew 1 tablet by mouth as needed for indigestion or heartburn.   diphenhydrAMINE HCl (BENADRYL ALLERGY PO) Take by mouth.   DIPHENHYDRAMINE HCL, TOPICAL, (BENADRYL ITCH STOPPING) 2 % GEL Apply topically.   Magnesium Gluconate 500 (27 Mg) MG TABS Take 500 mg by mouth daily at 6 (six) AM.   meloxicam (MOBIC) 7.5 MG tablet Take 1 tablet (7.5 mg total) by mouth daily.   methocarbamol (ROBAXIN) 500 MG tablet Take 1 tablet (500 mg total) by mouth every 8 (eight) hours as needed for muscle spasms.   Multiple Vitamins-Minerals (MULTIVITAMIN WITH MINERALS) tablet Take 1 tablet by mouth daily.   vitamin B-12 (CYANOCOBALAMIN) 100 MCG tablet Take 100 mcg by mouth daily.   [DISCONTINUED]  escitalopram (LEXAPRO) 10 MG tablet Take 1 tablet (10 mg total) by mouth daily.   [DISCONTINUED] Fluticasone-Umeclidin-Vilant (TRELEGY ELLIPTA) 100-62.5-25 MCG/ACT AEPB Inhale 1 puff into the lungs daily.   [DISCONTINUED] methocarbamol (ROBAXIN) 500 MG tablet Take 1 tablet (500 mg total) by mouth every 8 (eight) hours as needed for muscle spasms.   No facility-administered encounter medications on file as of 08/09/2022.    Surgical History: Past Surgical History:  Procedure Laterality Date   CATARACT EXTRACTION W/PHACO Left 11/29/2021   Procedure: CATARACT EXTRACTION PHACO AND INTRAOCULAR LENS PLACEMENT (IOC) LEFT;  Surgeon: Galen Manila, MD;  Location[facility];  Service: Ophthalmology;  Laterality: Left;  6.28 1:07.0   HOLEP-LASER ENUCLEATION OF THE PROSTATE WITH MORCELLATION N/A 04/19/2018   Procedure: HOLEP-LASER ENUCLEATION OF THE PROSTATE WITH MORCELLATION;  Surgeon: Sondra Come, MD;  Location: [facility];  Service: Urology;  Laterality: N/A;   NO PAST SURGERIES      Medical History: Past Medical History:  Diagnosis Date   Arthritis    Diverticulitis    Enlarged prostate    GERD (gastroesophageal reflux disease)    OCC   Headache    MIGRAINES   History of hiatal hernia     Family History: Family History  Problem Relation Age of Onset   Alzheimer's disease Mother    Alzheimer's disease Father    Diabetes Paternal Uncle    Prostate cancer Neg Hx    Bladder Cancer Neg Hx    Kidney cancer Neg Hx     Social  History   Socioeconomic History   Marital status: Single    Spouse name: Not on file   Number of children: Not on file   Years of education: Not on file   Highest education level: Not on file  Occupational History   Not on file  Tobacco Use   Smoking status: Every Day    Packs/day: 0.50    Years: 50.00    Additional pack years: 0.00    Total pack years: 25.00    Types: Cigarettes   Smokeless tobacco: Never  Substance and Sexual Activity    Alcohol use: Yes    Comment: occ   Drug use: Never   Sexual activity: Yes    Birth control/protection: None  Other Topics Concern   Not on file  Social History Narrative   Not on file   Social Determinants of Health   Financial Resource Strain: Not on file  Food Insecurity: Not on file  Transportation Needs: Not on file  Physical Activity: Not on file  Stress: Not on file  Social Connections: Not on file  Intimate Partner Violence: Not on file      Review of Systems  Constitutional:  Positive for activity change and fatigue. Negative for chills, diaphoresis, fever and unexpected weight change.  HENT: Negative.  Negative for congestion, rhinorrhea, sneezing and sore throat.   Eyes:  Negative for redness.  Respiratory:  Positive for shortness of breath (easily winded with minimal activity). Negative for cough, chest tightness and wheezing.   Cardiovascular: Negative.  Negative for chest pain and palpitations.  Gastrointestinal:  Negative for abdominal pain, constipation, diarrhea, nausea and vomiting.  Genitourinary:  Negative for dysuria and frequency.  Musculoskeletal:  Positive for arthralgias (chronic bilateral shoulder pain) and neck pain (chronic). Negative for back pain and joint swelling.  Skin:  Negative for rash.  Neurological: Negative.  Negative for tremors and numbness.  Hematological:  Negative for adenopathy. Does not bruise/bleed easily.  Psychiatric/Behavioral:  Positive for sleep disturbance (due to neck and shoulder pain). Negative for behavioral problems (Depression), self-injury and suicidal ideas. The patient is not nervous/anxious.     Vital Signs: BP 120/80   Pulse 63   Temp 97.6 F (36.4 C)   Resp 16   Ht 6\' 5"  (1.956 m)   Wt 224 lb 12.8 oz (102 kg)   SpO2 95%   BMI 26.66 kg/m    Physical Exam Vitals reviewed.  Constitutional:      General: He is not in acute distress.    Appearance: Normal appearance. He is not ill-appearing.  HENT:      Head: Normocephalic and atraumatic.  Eyes:     Pupils: Pupils are equal, round, and reactive to light.  Cardiovascular:     Rate and Rhythm: Normal rate and regular rhythm.  Pulmonary:     Effort: Pulmonary effort is normal. No respiratory distress.  Neurological:     Mental Status: He is alert and oriented to person, place, and time.  Psychiatric:        Mood and Affect: Mood normal.        Behavior: Behavior normal.        Assessment/Plan: 1. Centrilobular emphysema (HCC) Trelegy is too expensive, will continue to use sample that he has at home. I will find a more affordable inhaler that is covered by his insurance to prescribe for him.  Discontinue trelegy prescription.   2. Generalized osteoarthritis Continue methocarbamol prn as prescribed.  - methocarbamol (ROBAXIN) 500 MG  tablet; Take 1 tablet (500 mg total) by mouth every 8 (eight) hours as needed for muscle spasms.  Dispense: 90 tablet; Refill: 0  3. Depression, major, single episode, mild (HCC) Increased dose of escitalopram, take as prescribed, follow up in 2 months  - escitalopram (LEXAPRO) 20 MG tablet; Take 1 tablet (20 mg total) by mouth daily.  Dispense: 30 tablet; Refill: 2   General Counseling: laith ebersold understanding of the findings of todays visit and agrees with plan of treatment. I have discussed any further diagnostic evaluation that may be needed or ordered today. We also reviewed his medications today. he has been encouraged to call the office with any questions or concerns that should arise related to todays visit.    No orders of the defined types were placed in this encounter.   Meds ordered this encounter  Medications   escitalopram (LEXAPRO) 20 MG tablet    Sig: Take 1 tablet (20 mg total) by mouth daily.    Dispense:  30 tablet    Refill:  2    Note increased dose, discontinue 10 mg tablet and fill new script today.   methocarbamol (ROBAXIN) 500 MG tablet    Sig: Take 1  tablet (500 mg total) by mouth every 8 (eight) hours as needed for muscle spasms.    Dispense:  90 tablet    Refill:  0    Fill script now.    Return in about 2 months (around 10/09/2022) for F/U, eval new med, Bracen Schum PCP dose increase .   Total time spent:30 Minutes Time spent includes review of chart, medications, test results, and follow up plan with the patient.   [place] Controlled Substance Database was reviewed by me.  This patient was seen by Sallyanne Kuster, FNP-C in collaboration with Dr. Beverely Risen as a part of collaborative care agreement.   Josep Luviano R. Tedd Sias, MSN, FNP-C Internal medicine

## 2022-08-18 ENCOUNTER — Ambulatory Visit: Payer: Medicare Other | Attending: Cardiovascular Disease | Admitting: Cardiovascular Disease

## 2022-08-18 ENCOUNTER — Other Ambulatory Visit
Admission: RE | Admit: 2022-08-18 | Discharge: 2022-08-18 | Disposition: A | Discharge status: Home / Self Care | Payer: Medicare Other | Attending: Cardiovascular Disease | Admitting: Cardiovascular Disease

## 2022-08-18 ENCOUNTER — Encounter: Payer: Self-pay | Admitting: Cardiovascular Disease

## 2022-08-18 VITALS — BP 136/80 | HR 67 | Ht 77.0 in | Wt 223.5 lb

## 2022-08-18 DIAGNOSIS — Z720 Tobacco use: Secondary | ICD-10-CM | POA: Diagnosis not present

## 2022-08-18 DIAGNOSIS — E785 Hyperlipidemia, unspecified: Secondary | ICD-10-CM | POA: Diagnosis not present

## 2022-08-18 DIAGNOSIS — R072 Precordial pain: Secondary | ICD-10-CM | POA: Insufficient documentation

## 2022-08-18 DIAGNOSIS — R0602 Shortness of breath: Secondary | ICD-10-CM | POA: Insufficient documentation

## 2022-08-18 LAB — BASIC METABOLIC PANEL
Anion gap: 6 (ref 5–15)
BUN: 21 mg/dL (ref 8–23)
CO2: 24 mmol/L (ref 22–32)
Calcium: 9.1 mg/dL (ref 8.9–10.3)
Chloride: 108 mmol/L (ref 98–111)
Creatinine, Ser: 0.99 mg/dL (ref 0.61–1.24)
GFR, Estimated: >60 mL/min (ref >60)
Glucose, Bld: 108 mg/dL — ABNORMAL HIGH (ref 70–99)
Potassium: 4 mmol/L (ref 3.5–5.1)
Sodium: 138 mmol/L (ref 135–145)

## 2022-08-18 LAB — LIPID PANEL
Cholesterol: 225 mg/dL — ABNORMAL HIGH (ref 0–200)
HDL: 33 mg/dL — ABNORMAL LOW (ref >40)
LDL Cholesterol: 140 mg/dL — ABNORMAL HIGH (ref 0–99)
Total CHOL/HDL Ratio: 6.8 RATIO
Triglycerides: 262 mg/dL — ABNORMAL HIGH (ref <150)
VLDL: 52 mg/dL — ABNORMAL HIGH (ref 0–40)

## 2022-08-18 MED ORDER — METOPROLOL TARTRATE 100 MG PO TABS: ORAL_TABLET | ORAL | 0 refills | Status: DC

## 2022-08-18 NOTE — Patient Instructions (Addendum)
Medication Instructions:  No changes *If you need a refill on your cardiac medications before your next appointment, please call your pharmacy*   Lab Work: Your provider would like for you to have following labs drawn: BMET and Lipid .   Please go to the [facility] entrance and check in at the front desk.  You do not need an appointment.  They are open from 7am-6 pm.   If you have labs (blood work) drawn today and your tests are completely normal, you will receive your results only by: MyChart Message (if you have MyChart) OR A paper copy in the mail If you have any lab test that is abnormal or we need to change your treatment, we will call you to review the results.   Testing/Procedures: Your physician has requested that you have an echocardiogram. Echocardiography is a painless test that uses sound waves to create images of your heart. It provides your doctor with information about the size and shape of your heart and how well your heart's chambers and valves are working.   You may receive an ultrasound enhancing agent through an IV if needed to better visualize your heart during the echo. This procedure takes approximately one hour.  There are no restrictions for this procedure.  This will take place at [address] [address] [facility], [place] [address]    Follow-Up: At [facility], you and your health needs are our priority.  As part of our continuing mission to provide you with exceptional heart care, we have created designated Provider Care Teams.  These Care Teams include your primary Cardiologist (physician) and Advanced Practice Providers (APPs -  Physician Assistants and Nurse Practitioners) who all work together to provide you with the care you need, when you need it.  We recommend signing up for the patient portal called "MyChart".  Sign up information is provided on this After Visit Summary.  MyChart is used to connect with patients for  Virtual Visits (Telemedicine).  Patients are able to view lab/test results, encounter notes, upcoming appointments, etc.  Non-urgent messages can be sent to your provider as well.   To learn more about what you can do with MyChart, go to [email or web address].    Your next appointment:   3 month(s)  Provider:   You may see Dr. Kirke Corin or one of the following Advanced Practice Providers on your designated Care Team:   Nicolasa Ducking, NP Eula Listen, PA-C Cadence Fransico Michael, PA-C Charlsie Quest, NP    Other Instructions   Your cardiac CT will be scheduled at one of the below locations:   [facility] [address] [place], [place] [address] [phone]  OR  [facility] [address] [place], [place] [address] [phone]  OR   [facility] [address] [place], [place] [address] [phone]  If scheduled at [facility], please arrive at the [facility] Entrance (Entrance C2) of [facility] 30 minutes prior to test start time. You can use the FREE valet parking offered at entrance C (encouraged to control the heart rate for the test)  Proceed to the [facility] Radiology Department (first floor) to check-in and test prep.  All radiology patients and guests should use entrance C2 at [facility], accessed from [facility], even though the hospital's physical address listed is [address]    If scheduled at [facility] or [facility], please arrive 15 mins early for check-in and test prep.   Please follow these instructions carefully (unless otherwise directed):  Hold all erectile dysfunction medications at least 3 days (72 hrs) prior to test. (Ie viagra, cialis, sildenafil, tadalafil, etc) We will administer nitroglycerin during this exam.   On the Night Before the Test: Be sure to  Drink plenty of water. Do not consume any caffeinated/decaffeinated beverages or chocolate 12 hours prior to your test. Do not take any antihistamines 12 hours prior to your test.   On the Day of the Test: Drink plenty of water until 1 hour prior to the test. Do not eat any food 1 hour prior to test. You may take your regular medications prior to the test.  Take metoprolol (Lopressor) two hours prior to test.      After the Test: Drink plenty of water. After receiving IV contrast, you may experience a mild flushed feeling. This is normal. On occasion, you may experience a mild rash up to 24 hours after the test. This is not dangerous. If this occurs, you can take Benadryl 25 mg and increase your fluid intake. If you experience trouble breathing, this can be serious. If it is severe call 911 IMMEDIATELY. If it is mild, please call our office. If you take any of these medications: Glipizide/Metformin, Avandament, Glucavance, please do not take 48 hours after completing test unless otherwise instructed.  We will call to schedule your test 2-4 weeks out understanding that some insurance companies will need an authorization prior to the service being performed.   For non-scheduling related questions, please contact the cardiac imaging nurse navigator should you have any questions/concerns: Rockwell Alexandria, Cardiac Imaging Nurse Navigator Larey Brick, Cardiac Imaging Nurse Navigator [facility] Direct Office Dial: [phone]   For scheduling needs, including cancellations and rescheduling, please call [place], [phone].

## 2022-08-18 NOTE — Progress Notes (Signed)
Cardiology Office Note   Date:  08/18/2022   ID:  Ryan Gallagher, DOB 1955-08-18, MRN [id number]  PCP:  Sallyanne Kuster, NP  Cardiologist:   Lorine Bears, MD   Chief Complaint  Patient presents with   New Patient (Initial Visit)    CAD c/o dizziness, sob and fatigue. Meds reviewed verbally with pt.      History of Present Illness: Ryan Gallagher is a 67 y.o. male who was referred for evaluation of exertional dyspnea and coronary atherosclerosis noted on CT lungs.  He has no prior cardiac history.  He has prolonged history of tobacco use and has been smoking half a pack per day for 45 years.  He has no family history of premature coronary artery disease. He does have COPD and uses inhalers.  However, he reports significant worsening of exertional dyspnea and fatigue over the last 6 months.  This is associated with dizziness.  No chest pain.  This happens if he starts doing any yard work.  He does not exercise on a regular basis and is limited by bilateral knee arthritis.  He has occasional wheezing.  No previous cardiac testing.  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  The ascending aorta was dilated at 4.4 cm.    Past Medical History:  Diagnosis Date   Arthritis    Diverticulitis    Enlarged prostate    GERD (gastroesophageal reflux disease)    OCC   Headache    MIGRAINES   History of hiatal hernia     Past Surgical History:  Procedure Laterality Date   CATARACT EXTRACTION W/PHACO Left 11/29/2021   Procedure: CATARACT EXTRACTION PHACO AND INTRAOCULAR LENS PLACEMENT (IOC) LEFT;  Surgeon: Galen Manila, MD;  Location: [facility];  Service: Ophthalmology;  Laterality: Left;  6.28 1:07.0   HOLEP-LASER ENUCLEATION OF THE PROSTATE WITH MORCELLATION N/A 04/19/2018   Procedure: HOLEP-LASER ENUCLEATION OF THE PROSTATE WITH MORCELLATION;  Surgeon: Sondra Come, MD;  Location:  [facility] ORS;  Service: Urology;  Laterality: N/A;   NO PAST SURGERIES       Current Outpatient Medications  Medication Sig Dispense Refill   albuterol (VENTOLIN HFA) 108 (90 Base) MCG/ACT inhaler Inhale 2 puffs into the lungs every 6 (six) hours as needed for wheezing or shortness of breath. 8 g 2   aspirin-acetaminophen-caffeine (EXCEDRIN MIGRAINE) 250-250-65 MG per tablet Take 1 tablet by mouth every 6 (six) hours as needed for headache.     calcium carbonate (TUMS - DOSED IN MG ELEMENTAL CALCIUM) 500 MG chewable tablet Chew 1 tablet by mouth as needed for indigestion or heartburn.     diphenhydrAMINE HCl (BENADRYL ALLERGY PO) Take by mouth.     DIPHENHYDRAMINE HCL, TOPICAL, (BENADRYL ITCH STOPPING) 2 % GEL Apply topically.     escitalopram (LEXAPRO) 20 MG tablet Take 1 tablet (20 mg total) by mouth daily. 30 tablet 2   Magnesium Gluconate 500 (27 Mg) MG TABS Take 500 mg by mouth daily at 6 (six) AM.     meloxicam (MOBIC) 7.5 MG tablet Take 1 tablet (7.5 mg total) by mouth daily. 30 tablet 3   methocarbamol (ROBAXIN) 500 MG tablet Take 1 tablet (500 mg total) by mouth every 8 (eight) hours as needed for muscle spasms. 90 tablet 0   Multiple Vitamins-Minerals (MULTIVITAMIN WITH MINERALS) tablet Take 1 tablet by mouth daily.     vitamin B-12 (CYANOCOBALAMIN)  100 MCG tablet Take 100 mcg by mouth daily.     No current facility-administered medications for this visit.    Allergies:   Patient has no known allergies.    Social History:  The patient  reports that he has been smoking cigarettes. He has a 25.00 pack-year smoking history. He has never used smokeless tobacco. He reports current alcohol use. He reports that he does not use drugs.   Family History:  The patient's family history includes Alzheimer's disease in his father and mother; Diabetes in his paternal uncle.    ROS:  Please see the history of present illness.   Otherwise, review of systems are positive for none.   All other  systems are reviewed and negative.    PHYSICAL EXAM: VS:  BP 136/80 (BP Location: Right Arm, Patient Position: Sitting, Cuff Size: Normal)   Pulse 67   Ht 6\' 5"  (1.956 m)   Wt 223 lb 8 oz (101.4 kg)   SpO2 98%   BMI 26.50 kg/m  , BMI Body mass index is 26.5 kg/m. GEN: Well nourished, well developed, in no acute distress  HEENT: normal  Neck: no JVD, carotid bruits, or masses Cardiac: RRR; no murmurs, rubs, or gallops,no edema  Respiratory:  clear to auscultation bilaterally, normal work of breathing GI: soft, nontender, nondistended, + BS MS: no deformity or atrophy  Skin: warm and dry, no rash Neuro:  Strength and sensation are intact Psych: euthymic mood, full affect Vascular: Distal pulses are normal.   EKG:  EKG is ordered today. The ekg ordered today demonstrates normal sinus rhythm with no significant ST or T wave changes.   Recent Labs: 05/03/2022: ALT 21; BUN 13; Creatinine, Ser 0.91; Hemoglobin 16.3; Platelets 281; Potassium 4.6; Sodium 138; TSH 0.838    Lipid Panel No results found for: "CHOL", "TRIG", "HDL", "CHOLHDL", "VLDL", "LDLCALC", "LDLDIRECT"    Wt Readings from Last 3 Encounters:  08/18/22 223 lb 8 oz (101.4 kg)  08/09/22 224 lb 12.8 oz (102 kg)  07/03/22 227 lb 12.8 oz (103.3 kg)           No data to display            ASSESSMENT AND PLAN:  1.  Severe exertion dyspnea worrisome for angina equivalent given the presence of left main and three-vessel coronary artery calcifications on recent CT scan of the lungs.  I recommend evaluation with cardiac CTA. I also requested an echocardiogram to evaluate LV systolic function and pulmonary pressure.  2.  Dilated ascending aorta at 4.4 cm: This will be evaluated with CT scan with contrast.  This will likely require annual surveillance.  3.  Tobacco use: Smoking cessation is advised.  4.  He has no history of hyperlipidemia but I do not see a recent lipid profile.  I requested lipid  profile.    Disposition:   FU with me in 3 months  Signed,  Lorine Bears, MD  08/18/2022 9:52 AM    [facility]

## 2022-09-05 ENCOUNTER — Telehealth: Payer: Self-pay | Admitting: Cardiovascular Disease

## 2022-09-05 MED ORDER — ROSUVASTATIN CALCIUM 20 MG PO TABS: 20.0000 mg | ORAL_TABLET | Freq: Every day | ORAL | 3 refills | Status: DC

## 2022-09-05 NOTE — Telephone Encounter (Signed)
Pt's wife has been notified of the result along with recommendations. Pt's wife verbalized understanding. All questions (if any) were answered   Rosuvastatin 20 mg daily sent to requested pharmacy     Iran Ouch, MD 08/18/2022 12:01 PM EDT     Normal renal function.  His cholesterol and triglyceride are both very elevated.  I recommend adding rosuvastatin 20 mg once daily.

## 2022-09-05 NOTE — Telephone Encounter (Signed)
Pt's spouse was returning nurse call regarding results and is requesting a callback. Please advise.

## 2022-09-14 ENCOUNTER — Telehealth: Payer: Self-pay | Admitting: *Deleted

## 2022-09-14 NOTE — Telephone Encounter (Signed)
PT RECENTLY SAW DR. Kirke Corin 08/18/22

## 2022-09-14 NOTE — Telephone Encounter (Signed)
   Pre-operative Risk Assessment    Patient Name: Ryan Gallagher  DOB: 08-22-1955 MRN: [id number]      Request for Surgical Clearance    Procedure:   COLONOSCOPY  Date of Surgery:  Clearance TBD                                 Surgeon: NOT LISTED  Surgeon's Group or Practice Name:  [facility] Phone number:  [phone] Fax number:  [phone]   Type of Clearance Requested:   - Medical ; NO MEDICATIONS LISTED AS NEEDING TO BE HELD   Type of Anesthesia:  General    Additional requests/questions:    [name]   09/14/2022, 4:30 PM

## 2022-09-15 ENCOUNTER — Encounter: Payer: Self-pay | Admitting: Cardiovascular Disease

## 2022-09-15 NOTE — Telephone Encounter (Signed)
Preoperative team, patient has follow-up appointment with Dr. Kirke Corin after coronary CT.  I will defer preoperative cardiac evaluation to follow-up appointment.  Please add preoperative cardiac evaluation to appointment notes.  Thank you for your help.  Thomasene Ripple. Greenleigh Kauth NP-C     09/15/2022, 8:49 AM [facility] [address] Office [phone] Fax [phone]

## 2022-09-15 NOTE — Telephone Encounter (Signed)
Will update all parties involved per pre op APP pt has appt with Dr. Kirke Corin 11/23/22 after CT has been done 09/25/22. Pre op APP defer clearance to MD at 11/23/22 appt.

## 2022-09-21 ENCOUNTER — Telehealth (HOSPITAL_COMMUNITY): Payer: Self-pay | Admitting: *Deleted

## 2022-09-21 NOTE — Telephone Encounter (Signed)
Attempted to call patient regarding upcoming cardiac CT appointment. °Left message on voicemail with name and callback number ° °Graysin Luczynski RN Navigator Cardiac Imaging °[facility] °[phone] Office °[phone] Cell ° °

## 2022-09-21 NOTE — Telephone Encounter (Signed)
Patient's wife calling about her husband's upcoming cardiac imaging study; pt's wife verbalizes understanding of appt date/time, parking situation and where to check in, pre-test NPO status and medications ordered, and verified current allergies; name and call back number provided for further questions should they arise  Larey Brick RN Navigator Cardiac Imaging [facility] [phone] office [phone] cell  Patient to take 50mg  metoprolol tartrate if his HR is greater than 65bpm two hours prior to his cardiac CT scan.

## 2022-09-25 ENCOUNTER — Other Ambulatory Visit: Payer: Self-pay | Admitting: Cardiovascular Disease

## 2022-09-25 ENCOUNTER — Ambulatory Visit
Admission: RE | Admit: 2022-09-25 | Discharge: 2022-09-25 | Disposition: A | Discharge status: Home / Self Care | Payer: Medicare Other | Source: Ambulatory Visit | Attending: Cardiovascular Disease | Admitting: Cardiovascular Disease

## 2022-09-25 DIAGNOSIS — R071 Chest pain on breathing: Secondary | ICD-10-CM

## 2022-09-25 DIAGNOSIS — Z713 Dietary counseling and surveillance: Secondary | ICD-10-CM | POA: Diagnosis not present

## 2022-09-25 DIAGNOSIS — R072 Precordial pain: Secondary | ICD-10-CM | POA: Insufficient documentation

## 2022-09-25 DIAGNOSIS — I25118 Atherosclerotic heart disease of native coronary artery with other forms of angina pectoris: Secondary | ICD-10-CM | POA: Diagnosis not present

## 2022-09-25 DIAGNOSIS — Z720 Tobacco use: Secondary | ICD-10-CM | POA: Diagnosis not present

## 2022-09-25 DIAGNOSIS — R0602 Shortness of breath: Secondary | ICD-10-CM | POA: Diagnosis not present

## 2022-09-25 DIAGNOSIS — E785 Hyperlipidemia, unspecified: Secondary | ICD-10-CM | POA: Diagnosis not present

## 2022-09-25 DIAGNOSIS — R0609 Other forms of dyspnea: Secondary | ICD-10-CM | POA: Diagnosis not present

## 2022-09-25 MED ORDER — IOHEXOL 350 MG/ML SOLN
80.0000 mL | Freq: Once | INTRAVENOUS | Status: AC | PRN
  Administered 2022-09-25: 80 mL via INTRAVENOUS

## 2022-09-25 MED ORDER — NITROGLYCERIN 0.4 MG SL SUBL
0.8000 mg | SUBLINGUAL_TABLET | Freq: Once | SUBLINGUAL | Status: AC
  Administered 2022-09-25: 0.8 mg via SUBLINGUAL
  Filled 2022-09-25: qty 25

## 2022-09-25 NOTE — Progress Notes (Signed)
Pt tolerated procedure well with no issues. Pt ABCs intact. Pt denies any complaints. Pt encouraged to drink plenty of water throughout the day. Pt ambulatory with steady gait.  

## 2022-09-29 ENCOUNTER — Ambulatory Visit: Payer: Medicare Other | Attending: Medical | Admitting: Medical

## 2022-09-29 ENCOUNTER — Encounter: Payer: Self-pay | Admitting: Medical

## 2022-09-29 ENCOUNTER — Other Ambulatory Visit
Admission: RE | Admit: 2022-09-29 | Discharge: 2022-09-29 | Disposition: A | Discharge status: Home / Self Care | Payer: Medicare Other | Source: Ambulatory Visit | Attending: Medical | Admitting: Medical

## 2022-09-29 VITALS — BP 120/64 | HR 71 | Ht 77.0 in | Wt 227.1 lb

## 2022-09-29 DIAGNOSIS — R0602 Shortness of breath: Secondary | ICD-10-CM

## 2022-09-29 DIAGNOSIS — I77810 Thoracic aortic ectasia: Secondary | ICD-10-CM

## 2022-09-29 DIAGNOSIS — I25110 Atherosclerotic heart disease of native coronary artery with unstable angina pectoris: Secondary | ICD-10-CM | POA: Diagnosis not present

## 2022-09-29 DIAGNOSIS — E782 Mixed hyperlipidemia: Secondary | ICD-10-CM

## 2022-09-29 DIAGNOSIS — Z720 Tobacco use: Secondary | ICD-10-CM

## 2022-09-29 LAB — BASIC METABOLIC PANEL
Anion gap: 8 (ref 5–15)
BUN: 18 mg/dL (ref 8–23)
CO2: 22 mmol/L (ref 22–32)
Calcium: 8.7 mg/dL — ABNORMAL LOW (ref 8.9–10.3)
Chloride: 105 mmol/L (ref 98–111)
Creatinine, Ser: 0.81 mg/dL (ref 0.61–1.24)
GFR, Estimated: >60 mL/min (ref >60)
Glucose, Bld: 94 mg/dL (ref 70–99)
Potassium: 4.1 mmol/L (ref 3.5–5.1)
Sodium: 135 mmol/L (ref 135–145)

## 2022-09-29 LAB — CBC
HCT: 45.3 % (ref 39.0–52.0)
Hemoglobin: 15 g/dL (ref 13.0–17.0)
MCH: 31.1 pg (ref 26.0–34.0)
MCHC: 33.1 g/dL (ref 30.0–36.0)
MCV: 94 fL (ref 80.0–100.0)
Platelets: 255 10*3/uL (ref 150–400)
RBC: 4.82 MIL/uL (ref 4.22–5.81)
RDW: 13.1 % (ref 11.5–15.5)
WBC: 10.1 10*3/uL (ref 4.0–10.5)
nRBC: 0 % (ref 0.0–0.2)

## 2022-09-29 MED ORDER — SODIUM CHLORIDE 0.9% FLUSH
3.0000 mL | Freq: Two times a day (BID) | INTRAVENOUS | Status: DC
Start: 2022-09-29 — End: 2022-09-29

## 2022-09-29 MED ORDER — NITROGLYCERIN 0.4 MG SL SUBL: 0.4000 mg | SUBLINGUAL_TABLET | SUBLINGUAL | 0 refills | Status: DC | PRN

## 2022-09-29 MED ORDER — ASPIRIN 81 MG PO TBEC: 81.0000 mg | DELAYED_RELEASE_TABLET | Freq: Every day | ORAL | 3 refills | Status: AC

## 2022-09-29 MED ORDER — METOPROLOL SUCCINATE ER 25 MG PO TB24: 12.5000 mg | ORAL_TABLET | Freq: Every day | ORAL | 0 refills | Status: DC

## 2022-09-29 NOTE — Patient Instructions (Addendum)
Medication Instructions:  START Aspirin 81 mg once daily START Metoprolol (Toprol) 12.5 mg once daily (half of the 25 mg tablet)  Take Nitroglycerin as needed for chest pain: Place one tablet under the tongue as needed every 5 minutes for chest pain. If you have to use three tablets with no relief, please call 911 or go to your closest Emergency Department.  *If you need a refill on your cardiac medications before your next appointment, please call your pharmacy*   Lab Work: Your provider would like for you to have following labs drawn: CBC and BMET.   Please go to the [facility] entrance and check in at the front desk.  You do not need an appointment.  They are open from 7am-6 pm.   If you have labs (blood work) drawn today and your tests are completely normal, you will receive your results only by: MyChart Message (if you have MyChart) OR A paper copy in the mail If you have any lab test that is abnormal or we need to change your treatment, we will call you to review the results.   Testing/Procedures: Your physician has requested that you have a cardiac catheterization. Cardiac catheterization is used to diagnose and/or treat various heart conditions. Doctors may recommend this procedure for a number of different reasons. The most common reason is to evaluate chest pain. Chest pain can be a symptom of coronary artery disease (CAD), and cardiac catheterization can show whether plaque is narrowing or blocking your heart's arteries. This procedure is also used to evaluate the valves, as well as measure the blood flow and oxygen levels in different parts of your heart. For further information please visit [email or web address] Please follow instruction sheet, as given.    Follow-Up: At [facility], you and your health needs are our priority.  As part of our continuing mission to provide you with exceptional heart care, we have created designated Provider Care Teams.  These  Care Teams include your primary Cardiologist (physician) and Advanced Practice Providers (APPs -  Physician Assistants and Nurse Practitioners) who all work together to provide you with the care you need, when you need it.  We recommend signing up for the patient portal called "MyChart".  Sign up information is provided on this After Visit Summary.  MyChart is used to connect with patients for Virtual Visits (Telemedicine).  Patients are able to view lab/test results, encounter notes, upcoming appointments, etc.  Non-urgent messages can be sent to your provider as well.   To learn more about what you can do with MyChart, go to [email or web address].    Your next appointment:   2 week(s)  Provider:   You may see Lorine Bears, MD or one of the following Advanced Practice Providers on your designated Care Team:   Nicolasa Ducking, NP Eula Listen, PA-C Cadence Fransico Michael, PA-C Charlsie Quest, NP    Other Instructions  [facility] A DEPT OF [place]. [facility] AT [facility] [address] [place] [address] Dept: [phone] Loc: [phone]  [facility]  09/29/2022  You are scheduled for a Cardiac Catheterization on Monday, July 8 with Dr. Lorine Bears.  1. Please arrive at the [facility] Vascular Center Entrance of [facility], [address] [place], [place] [address] at 8:30 AM (This is 1 hour(s) prior to your procedure time).  Proceed to the Check-In Desk directly inside the entrance.  Procedure Parking: Use the entrance off of the [facility] [address] side of the hospital. Turn right upon entering and follow the driveway to parking that is directly in front of the Heart & Vascular Center. There is no valet parking available at this entrance, however there is an awning directly in front of the Heart & Vascular Center for drop off/ pick up for patients.  Special note: Every effort is made to have your procedure done on time.  Please understand that emergencies sometimes delay scheduled procedures.  2. Diet: Do not eat solid foods after midnight.  The patient may have clear liquids until 5am upon the day of the procedure.  3. Labs: You will need to have blood drawn on 09/29/22. You do not need to be fasting.  4. Medication instructions in preparation for your procedure: Nothing to hold  On the morning of your procedure, take your Aspirin 81 mg and any morning medicines NOT listed above.  You may use sips of water.  5. Plan to go home the same day, you will only stay overnight if medically necessary. 6. Bring a current list of your medications and current insurance cards. 7. You MUST have a responsible person to drive you home. 8. Someone MUST be with you the first 24 hours after you arrive home or your discharge will be delayed. 9. Please wear clothes that are easy to get on and off and wear slip-on shoes.  Thank you for allowing [place] to care for you!   -- [place] [facility]

## 2022-09-29 NOTE — Progress Notes (Signed)
Cardiology Office Note:    Date:  09/29/2022   ID:  Ryan Gallagher, DOB Sep 30, 1955, MRN [id number]  PCP:  Sallyanne Kuster, NP  [facility] Cardiologist:  Lorine Bears, MD  [facility] Electrophysiologist:  None   Referring MD: Sallyanne Kuster, NP   Chief Complaint: Cardiac CTA follow-up  History of Present Illness:    Ryan Gallagher is a 67 y.o. male with a hx of COPD/tobacco, CAD, ascending aortic dilation use who presents for cardiac CTA follow-up.  Patient was seen 08/18/2022 as a new patient reporting dizziness, shortness of breath and fatigue.  He reported worsening exertional shortness of breath and fatigue over the last 6 months.  He also reported associated dizziness.  CT scan of the lungs in March 2024 showed evidence of aortic and 3 vessel coronary artery calcification.  Echo and cardiac CT were ordered. Cardiac CTA showed coronary calcium score 492, 75th percentile for age and sex matched, severe proximal left circumflex stenosis greater than 70%, moderate proximal LAD stenosis. It was sent for FFR, which showed significant stenosis in the proximal left circumflex (FFR 0.78), and Cardiac cath was recommended.  Today, Cardiac CTA was reviewed. Cath was discussed, and patient is willing to pursue this. Echo is scheduled for next week. He reports persistent DOE, no chest pain. He reports dizziness and headaches. Headaches worse with the heat. No lower leg edema. Dizziness can be at any time, it is worse with position change. He is requesting nicotine patches.   Past Medical History:  Diagnosis Date   Arthritis    COPD, mild (HCC)    Diverticulitis    Enlarged prostate    GERD (gastroesophageal reflux disease)    OCC   Headache    MIGRAINES   History of hiatal hernia     Past Surgical History:  Procedure Laterality Date   CATARACT EXTRACTION W/PHACO Left 11/29/2021   Procedure: CATARACT EXTRACTION PHACO AND INTRAOCULAR LENS PLACEMENT (IOC) LEFT;  Surgeon:  Galen Manila, MD;  Location: [facility];  Service: Ophthalmology;  Laterality: Left;  6.28 1:07.0   HOLEP-LASER ENUCLEATION OF THE PROSTATE WITH MORCELLATION N/A 04/19/2018   Procedure: HOLEP-LASER ENUCLEATION OF THE PROSTATE WITH MORCELLATION;  Surgeon: Sondra Come, MD;  Location: [facility];  Service: Urology;  Laterality: N/A;   NO PAST SURGERIES      Current Medications: Current Meds  Medication Sig   albuterol (VENTOLIN HFA) 108 (90 Base) MCG/ACT inhaler Inhale 2 puffs into the lungs every 6 (six) hours as needed for wheezing or shortness of breath.   aspirin EC 81 MG tablet Take 1 tablet (81 mg total) by mouth daily. Swallow whole.   aspirin-acetaminophen-caffeine (EXCEDRIN MIGRAINE) 250-250-65 MG per tablet Take 1 tablet by mouth every 6 (six) hours as needed for headache.   calcium carbonate (TUMS - DOSED IN MG ELEMENTAL CALCIUM) 500 MG chewable tablet Chew 1 tablet by mouth as needed for indigestion or heartburn.   diphenhydrAMINE HCl (BENADRYL ALLERGY PO) Take by mouth.   DIPHENHYDRAMINE HCL, TOPICAL, (BENADRYL ITCH STOPPING) 2 % GEL Apply topically.   escitalopram (LEXAPRO) 20 MG tablet Take 1 tablet (20 mg total) by mouth daily.   Magnesium Gluconate 500 (27 Mg) MG TABS Take 500 mg by mouth daily at 6 (six) AM.   meloxicam (MOBIC) 7.5 MG tablet Take 1 tablet (7.5 mg total) by mouth daily.   methocarbamol (ROBAXIN) 500 MG tablet Take 1 tablet (500 mg total) by mouth every 8 (eight) hours as needed for  muscle spasms.   metoprolol succinate (TOPROL-XL) 25 MG 24 hr tablet Take 0.5 tablets (12.5 mg total) by mouth daily.   Multiple Vitamins-Minerals (MULTIVITAMIN WITH MINERALS) tablet Take 1 tablet by mouth daily.   nitroGLYCERIN (NITROSTAT) 0.4 MG SL tablet Place 1 tablet (0.4 mg total) under the tongue every 5 (five) minutes as needed for chest pain.   rosuvastatin (CRESTOR) 20 MG tablet Take 1 tablet (20 mg total) by mouth daily.   vitamin B-12 (CYANOCOBALAMIN) 100  MCG tablet Take 100 mcg by mouth daily.   [DISCONTINUED] metoprolol tartrate (LOPRESSOR) 100 MG tablet Take one tablet 2 hours before the test.     Allergies:   Patient has no known allergies.   Social History   Socioeconomic History   Marital status: Single    Spouse name: Not on file   Number of children: Not on file   Years of education: Not on file   Highest education level: Not on file  Occupational History   Not on file  Tobacco Use   Smoking status: Every Day    Packs/day: 0.50    Years: 50.00    Additional pack years: 0.00    Total pack years: 25.00    Types: Cigarettes   Smokeless tobacco: Never  Vaping Use   Vaping Use: Never used  Substance and Sexual Activity   Alcohol use: Yes    Comment: occ   Drug use: Never   Sexual activity: Yes    Birth control/protection: None  Other Topics Concern   Not on file  Social History Narrative   Not on file   Social Determinants of Health   Financial Resource Strain: Not on file  Food Insecurity: Not on file  Transportation Needs: Not on file  Physical Activity: Not on file  Stress: Not on file  Social Connections: Not on file     Family History: The patient's family history includes Alzheimer's disease in his father and mother; Diabetes in his paternal uncle; Heart disease in his father. There is no history of Prostate cancer, Bladder Cancer, or Kidney cancer.  ROS:   Please see the history of present illness.     All other systems reviewed and are negative.  EKGs/Labs/Other Studies Reviewed:    The following studies were reviewed today:  Cardiac CTA 09/2022 IMPRESSION: 1. Coronary calcium score of 492. This was 75th percentile for age and sex matched control.   2. Normal coronary origin with right dominance.   3. Severe proximal LCx stenosis (>70%).   4. Moderate proximal LAD stenosis (50%).   5. CAD-RADS 4 Severe stenosis. (70-99% or > 50% left main). Cardiac catheterization is recommended. Consider  symptom-guided anti-ischemic pharmacotherapy as well as risk factor modification per guideline directed care.   6. Additional analysis with CT FFR will be submitted and reported separately.  FFR 09/2022 IMPRESSION: 1. CT FFR analysis showed significant stenosis in the proximal LCx. FFRct 0.78.   2.  Cardiac catheterization recommended.  EKG:  EKG is ordered today.  The ekg ordered today demonstrates NSR 71bpm, nonspecific T wave changes  Recent Labs: 05/03/2022: ALT 21; Hemoglobin 16.3; Platelets 281; TSH 0.838 08/18/2022: BUN 21; Creatinine, Ser 0.99; Potassium 4.0; Sodium 138  Recent Lipid Panel    Component Value Date/Time   CHOL 225 (H) 08/18/2022 1051   TRIG 262 (H) 08/18/2022 1051   HDL 33 (L) 08/18/2022 1051   CHOLHDL 6.8 08/18/2022 1051   VLDL 52 (H) 08/18/2022 1051   LDLCALC 140 (H) 08/18/2022  1051     Physical Exam:    VS:  BP 120/64 (BP Location: Left Arm, Patient Position: Sitting, Cuff Size: Normal)   Pulse 71   Ht 6\' 5"  (1.956 m)   Wt 227 lb 2 oz (103 kg)   SpO2 97%   BMI 26.93 kg/m     Wt Readings from Last 3 Encounters:  09/29/22 227 lb 2 oz (103 kg)  08/18/22 223 lb 8 oz (101.4 kg)  08/09/22 224 lb 12.8 oz (102 kg)     GEN:  Well nourished, well developed in no acute distress HEENT: Normal NECK: No JVD; No carotid bruits LYMPHATICS: No lymphadenopathy CARDIAC: RRR, no murmurs, rubs, gallops RESPIRATORY:  Clear to auscultation without rales, wheezing or rhonchi  ABDOMEN: Soft, non-tender, non-distended MUSCULOSKELETAL:  No edema; No deformity  SKIN: Warm and dry NEUROLOGIC:  Alert and oriented x 3 PSYCHIATRIC:  Normal affect   ASSESSMENT:    1. Coronary artery disease involving native coronary artery of native heart with unstable angina pectoris (HCC)   2. Shortness of breath   3. Hyperlipidemia, mixed   4. Tobacco use   5. Ascending aorta dilation (HCC)    PLAN:    In order of problems listed above:  CAD Abnormal Cardiac  CTA DOE Cardiac CTA showed coronary calcium score 492, 75th percentile for age and sex matched, severe proximal left circumflex stenosis greater than 70%, moderate proximal LAD stenosis.  It was sent for FFR, which showed significant stenosis in the proximal left circumflex.  Cardiac cath was recommended.  Patient reports persistent exertional dyspnea on exertion.  No overt chest pain.  We will set patient up for a left heart catheterization.  I will start aspirin, SL NTG and Toprol 12.5mg  daily.  Echo has been ordered, but not performed yet.  I recommend he proceed with echocardiogram.  We will see patient back after cardiac cath  HLD LDL 140, HDL 33, triglycerides 262, total cholesterol 225.  Patient was recently started on Crestor continue Crestor milligrams daily. Re-check lipids/LFTs in 6 weeks.   Tobacco use/COPD Will prescribe patches at follow-up  Ascending aortic dilation Most recent imaging showed ascending aorta dilated at 4.4 cm.  Can follow-up with serial imaging studies.  Dizziness Unclear etiology of dizziness.  She reports good hydration and good oral intake. Orthostatics negative today. Start Toprol as above.  We will continue to monitor symptoms.  Disposition: Follow up in 3 week(s) with MD/APP   Shared Decision Making/Informed Consent   Informed Consent   Shared Decision Making/Informed Consent The risks [stroke (1 in 1000), death (1 in 1000), kidney failure [usually temporary] (1 in 500), bleeding (1 in 200), allergic reaction [possibly serious] (1 in 200)], benefits (diagnostic support and management of coronary artery disease) and alternatives of a cardiac catheterization were discussed in detail with Mr. Ebben and he is willing to proceed.       Signed, Kinzy Weyers David Stall, PA-C  09/29/2022 10:29 AM    [facility]

## 2022-09-29 NOTE — H&P (View-Only) (Signed)
Cardiology Office Note:    Date:  09/29/2022   ID:  Ryan Gallagher, DOB 11/11/1955, MRN [id number]  PCP:  Abernathy, Alyssa, NP  [facility] Cardiologist:  Muhammad Arida, MD  [facility] Electrophysiologist:  None   Referring MD: Abernathy, Alyssa, NP   Chief Complaint: Cardiac CTA follow-up  History of Present Illness:    Ryan Gallagher is a 67 y.o. male with a hx of COPD/tobacco, CAD, ascending aortic dilation use who presents for cardiac CTA follow-up.  Patient was seen 08/18/2022 as a new patient reporting dizziness, shortness of breath and fatigue.  He reported worsening exertional shortness of breath and fatigue over the last 6 months.  He also reported associated dizziness.  CT scan of the lungs in March 2024 showed evidence of aortic and 3 vessel coronary artery calcification.  Echo and cardiac CT were ordered. Cardiac CTA showed coronary calcium score 492, 75th percentile for age and sex matched, severe proximal left circumflex stenosis greater than 70%, moderate proximal LAD stenosis. It was sent for FFR, which showed significant stenosis in the proximal left circumflex (FFR 0.78), and Cardiac cath was recommended.  Today, Cardiac CTA was reviewed. Cath was discussed, and patient is willing to pursue this. Echo is scheduled for next week. He reports persistent DOE, no chest pain. He reports dizziness and headaches. Headaches worse with the heat. No lower leg edema. Dizziness can be at any time, it is worse with position change. He is requesting nicotine patches.   Past Medical History:  Diagnosis Date   Arthritis    COPD, mild (HCC)    Diverticulitis    Enlarged prostate    GERD (gastroesophageal reflux disease)    OCC   Headache    MIGRAINES   History of hiatal hernia     Past Surgical History:  Procedure Laterality Date   CATARACT EXTRACTION W/PHACO Left 11/29/2021   Procedure: CATARACT EXTRACTION PHACO AND INTRAOCULAR LENS PLACEMENT (IOC) LEFT;  Surgeon:  Porfilio, William, MD;  Location: [facility];  Service: Ophthalmology;  Laterality: Left;  6.28 1:07.0   HOLEP-LASER ENUCLEATION OF THE PROSTATE WITH MORCELLATION N/A 04/19/2018   Procedure: HOLEP-LASER ENUCLEATION OF THE PROSTATE WITH MORCELLATION;  Surgeon: Sninsky, Brian C, MD;  Location: [facility];  Service: Urology;  Laterality: N/A;   NO PAST SURGERIES      Current Medications: Current Meds  Medication Sig   albuterol (VENTOLIN HFA) 108 (90 Base) MCG/ACT inhaler Inhale 2 puffs into the lungs every 6 (six) hours as needed for wheezing or shortness of breath.   aspirin EC 81 MG tablet Take 1 tablet (81 mg total) by mouth daily. Swallow whole.   aspirin-acetaminophen-caffeine (EXCEDRIN MIGRAINE) 250-250-65 MG per tablet Take 1 tablet by mouth every 6 (six) hours as needed for headache.   calcium carbonate (TUMS - DOSED IN MG ELEMENTAL CALCIUM) 500 MG chewable tablet Chew 1 tablet by mouth as needed for indigestion or heartburn.   diphenhydrAMINE HCl (BENADRYL ALLERGY PO) Take by mouth.   DIPHENHYDRAMINE HCL, TOPICAL, (BENADRYL ITCH STOPPING) 2 % GEL Apply topically.   escitalopram (LEXAPRO) 20 MG tablet Take 1 tablet (20 mg total) by mouth daily.   Magnesium Gluconate 500 (27 Mg) MG TABS Take 500 mg by mouth daily at 6 (six) AM.   meloxicam (MOBIC) 7.5 MG tablet Take 1 tablet (7.5 mg total) by mouth daily.   methocarbamol (ROBAXIN) 500 MG tablet Take 1 tablet (500 mg total) by mouth every 8 (eight) hours as needed for   muscle spasms.   metoprolol succinate (TOPROL-XL) 25 MG 24 hr tablet Take 0.5 tablets (12.5 mg total) by mouth daily.   Multiple Vitamins-Minerals (MULTIVITAMIN WITH MINERALS) tablet Take 1 tablet by mouth daily.   nitroGLYCERIN (NITROSTAT) 0.4 MG SL tablet Place 1 tablet (0.4 mg total) under the tongue every 5 (five) minutes as needed for chest pain.   rosuvastatin (CRESTOR) 20 MG tablet Take 1 tablet (20 mg total) by mouth daily.   vitamin B-12 (CYANOCOBALAMIN) 100  MCG tablet Take 100 mcg by mouth daily.   [DISCONTINUED] metoprolol tartrate (LOPRESSOR) 100 MG tablet Take one tablet 2 hours before the test.     Allergies:   Patient has no known allergies.   Social History   Socioeconomic History   Marital status: Single    Spouse name: Not on file   Number of children: Not on file   Years of education: Not on file   Highest education level: Not on file  Occupational History   Not on file  Tobacco Use   Smoking status: Every Day    Packs/day: 0.50    Years: 50.00    Additional pack years: 0.00    Total pack years: 25.00    Types: Cigarettes   Smokeless tobacco: Never  Vaping Use   Vaping Use: Never used  Substance and Sexual Activity   Alcohol use: Yes    Comment: occ   Drug use: Never   Sexual activity: Yes    Birth control/protection: None  Other Topics Concern   Not on file  Social History Narrative   Not on file   Social Determinants of Health   Financial Resource Strain: Not on file  Food Insecurity: Not on file  Transportation Needs: Not on file  Physical Activity: Not on file  Stress: Not on file  Social Connections: Not on file     Family History: The patient's family history includes Alzheimer's disease in his father and mother; Diabetes in his paternal uncle; Heart disease in his father. There is no history of Prostate cancer, Bladder Cancer, or Kidney cancer.  ROS:   Please see the history of present illness.     All other systems reviewed and are negative.  EKGs/Labs/Other Studies Reviewed:    The following studies were reviewed today:  Cardiac CTA 09/2022 IMPRESSION: 1. Coronary calcium score of 492. This was 75th percentile for age and sex matched control.   2. Normal coronary origin with right dominance.   3. Severe proximal LCx stenosis (>70%).   4. Moderate proximal LAD stenosis (50%).   5. CAD-RADS 4 Severe stenosis. (70-99% or > 50% left main). Cardiac catheterization is recommended. Consider  symptom-guided anti-ischemic pharmacotherapy as well as risk factor modification per guideline directed care.   6. Additional analysis with CT FFR will be submitted and reported separately.  FFR 09/2022 IMPRESSION: 1. CT FFR analysis showed significant stenosis in the proximal LCx. FFRct 0.78.   2.  Cardiac catheterization recommended.  EKG:  EKG is ordered today.  The ekg ordered today demonstrates NSR 71bpm, nonspecific T wave changes  Recent Labs: 05/03/2022: ALT 21; Hemoglobin 16.3; Platelets 281; TSH 0.838 08/18/2022: BUN 21; Creatinine, Ser 0.99; Potassium 4.0; Sodium 138  Recent Lipid Panel    Component Value Date/Time   CHOL 225 (H) 08/18/2022 1051   TRIG 262 (H) 08/18/2022 1051   HDL 33 (L) 08/18/2022 1051   CHOLHDL 6.8 08/18/2022 1051   VLDL 52 (H) 08/18/2022 1051   LDLCALC 140 (H) 08/18/2022   1051     Physical Exam:    VS:  BP 120/64 (BP Location: Left Arm, Patient Position: Sitting, Cuff Size: Normal)   Pulse 71   Ht 6' 5" (1.956 m)   Wt 227 lb 2 oz (103 kg)   SpO2 97%   BMI 26.93 kg/m     Wt Readings from Last 3 Encounters:  09/29/22 227 lb 2 oz (103 kg)  08/18/22 223 lb 8 oz (101.4 kg)  08/09/22 224 lb 12.8 oz (102 kg)     GEN:  Well nourished, well developed in no acute distress HEENT: Normal NECK: No JVD; No carotid bruits LYMPHATICS: No lymphadenopathy CARDIAC: RRR, no murmurs, rubs, gallops RESPIRATORY:  Clear to auscultation without rales, wheezing or rhonchi  ABDOMEN: Soft, non-tender, non-distended MUSCULOSKELETAL:  No edema; No deformity  SKIN: Warm and dry NEUROLOGIC:  Alert and oriented x 3 PSYCHIATRIC:  Normal affect   ASSESSMENT:    1. Coronary artery disease involving native coronary artery of native heart with unstable angina pectoris (HCC)   2. Shortness of breath   3. Hyperlipidemia, mixed   4. Tobacco use   5. Ascending aorta dilation (HCC)    PLAN:    In order of problems listed above:  CAD Abnormal Cardiac  CTA DOE Cardiac CTA showed coronary calcium score 492, 75th percentile for age and sex matched, severe proximal left circumflex stenosis greater than 70%, moderate proximal LAD stenosis.  It was sent for FFR, which showed significant stenosis in the proximal left circumflex.  Cardiac cath was recommended.  Patient reports persistent exertional dyspnea on exertion.  No overt chest pain.  We will set patient up for a left heart catheterization.  I will start aspirin, SL NTG and Toprol 12.5mg daily.  Echo has been ordered, but not performed yet.  I recommend he proceed with echocardiogram.  We will see patient back after cardiac cath  HLD LDL 140, HDL 33, triglycerides 262, total cholesterol 225.  Patient was recently started on Crestor continue Crestor milligrams daily. Re-check lipids/LFTs in 6 weeks.   Tobacco use/COPD Will prescribe patches at follow-up  Ascending aortic dilation Most recent imaging showed ascending aorta dilated at 4.4 cm.  Can follow-up with serial imaging studies.  Dizziness Unclear etiology of dizziness.  She reports good hydration and good oral intake. Orthostatics negative today. Start Toprol as above.  We will continue to monitor symptoms.  Disposition: Follow up in 3 week(s) with MD/APP   Shared Decision Making/Informed Consent   Informed Consent   Shared Decision Making/Informed Consent The risks [stroke (1 in 1000), death (1 in 1000), kidney failure [usually temporary] (1 in 500), bleeding (1 in 200), allergic reaction [possibly serious] (1 in 200)], benefits (diagnostic support and management of coronary artery disease) and alternatives of a cardiac catheterization were discussed in detail with Mr. Shew and he is willing to proceed.       Signed, Koreena Joost H Marco Raper, PA-C  09/29/2022 10:29 AM    [facility]  

## 2022-10-02 ENCOUNTER — Encounter: Payer: Self-pay | Admitting: Cardiovascular Disease

## 2022-10-02 ENCOUNTER — Other Ambulatory Visit: Payer: Self-pay

## 2022-10-02 ENCOUNTER — Encounter
Admission: RE | Disposition: A | Discharge status: Home / Self Care | Payer: Self-pay | Source: Home / Self Care | Attending: Cardiovascular Disease

## 2022-10-02 ENCOUNTER — Ambulatory Visit
Admission: RE | Admit: 2022-10-02 | Discharge: 2022-10-02 | Disposition: A | Discharge status: Home / Self Care | Payer: Medicare Other | Attending: Cardiovascular Disease | Admitting: Cardiovascular Disease

## 2022-10-02 DIAGNOSIS — R0602 Shortness of breath: Secondary | ICD-10-CM | POA: Diagnosis not present

## 2022-10-02 DIAGNOSIS — Z79899 Other long term (current) drug therapy: Secondary | ICD-10-CM | POA: Insufficient documentation

## 2022-10-02 DIAGNOSIS — I25110 Atherosclerotic heart disease of native coronary artery with unstable angina pectoris: Secondary | ICD-10-CM | POA: Diagnosis not present

## 2022-10-02 DIAGNOSIS — R931 Abnormal findings on diagnostic imaging of heart and coronary circulation: Secondary | ICD-10-CM

## 2022-10-02 DIAGNOSIS — J449 Chronic obstructive pulmonary disease, unspecified: Secondary | ICD-10-CM | POA: Insufficient documentation

## 2022-10-02 DIAGNOSIS — R42 Dizziness and giddiness: Secondary | ICD-10-CM | POA: Insufficient documentation

## 2022-10-02 DIAGNOSIS — F17210 Nicotine dependence, cigarettes, uncomplicated: Secondary | ICD-10-CM | POA: Insufficient documentation

## 2022-10-02 DIAGNOSIS — E782 Mixed hyperlipidemia: Secondary | ICD-10-CM | POA: Diagnosis not present

## 2022-10-02 HISTORY — PX: LEFT HEART CATH AND CORONARY ANGIOGRAPHY: CATH118249

## 2022-10-02 HISTORY — PX: CORONARY PRESSURE/FFR STUDY: CATH118243

## 2022-10-02 LAB — POCT ACTIVATED CLOTTING TIME: Activated Clotting Time: 232 seconds

## 2022-10-02 SURGERY — LEFT HEART CATH AND CORONARY ANGIOGRAPHY
Anesthesia: Moderate Sedation

## 2022-10-02 MED ORDER — ASPIRIN 81 MG PO CHEW
81.0000 mg | CHEWABLE_TABLET | ORAL | Status: AC
  Administered 2022-10-02: 81 mg via ORAL

## 2022-10-02 MED ORDER — HEPARIN (PORCINE) IN NACL 1000-0.9 UT/500ML-% IV SOLN
INTRAVENOUS | Status: AC
  Filled 2022-10-02: qty 1000

## 2022-10-02 MED ORDER — VERAPAMIL HCL 2.5 MG/ML IV SOLN
INTRAVENOUS | Status: DC | PRN
  Administered 2022-10-02: 2.5 mg via INTRA_ARTERIAL

## 2022-10-02 MED ORDER — SODIUM CHLORIDE 0.9% FLUSH: 3.0000 mL | Freq: Two times a day (BID) | INTRAVENOUS | Status: DC

## 2022-10-02 MED ORDER — VERAPAMIL HCL 2.5 MG/ML IV SOLN
INTRAVENOUS | Status: AC
  Filled 2022-10-02: qty 2

## 2022-10-02 MED ORDER — SODIUM CHLORIDE 0.9 % IV SOLN: 250.0000 mL | INTRAVENOUS | Status: DC | PRN

## 2022-10-02 MED ORDER — FENTANYL CITRATE (PF) 100 MCG/2ML IJ SOLN
INTRAMUSCULAR | Status: AC
  Filled 2022-10-02: qty 2

## 2022-10-02 MED ORDER — SODIUM CHLORIDE 0.9 % WEIGHT BASED INFUSION
1.0000 mL/kg/h | INTRAVENOUS | Status: DC
  Administered 2022-10-02: 1 mL/kg/h via INTRAVENOUS

## 2022-10-02 MED ORDER — HEPARIN (PORCINE) IN NACL 1000-0.9 UT/500ML-% IV SOLN
INTRAVENOUS | Status: DC | PRN
  Administered 2022-10-02 (×2): 500 mL

## 2022-10-02 MED ORDER — MIDAZOLAM HCL 2 MG/2ML IJ SOLN
INTRAMUSCULAR | Status: AC
  Filled 2022-10-02: qty 2

## 2022-10-02 MED ORDER — LIDOCAINE HCL (PF) 1 % IJ SOLN
INTRAMUSCULAR | Status: DC | PRN
  Administered 2022-10-02: 5 mL

## 2022-10-02 MED ORDER — SODIUM CHLORIDE 0.9% FLUSH: 3.0000 mL | INTRAVENOUS | Status: DC | PRN

## 2022-10-02 MED ORDER — ACETAMINOPHEN 325 MG PO TABS: 650.0000 mg | ORAL_TABLET | ORAL | Status: DC | PRN

## 2022-10-02 MED ORDER — SODIUM CHLORIDE 0.9 % WEIGHT BASED INFUSION
3.0000 mL/kg/h | INTRAVENOUS | Status: AC
  Administered 2022-10-02: 3 mL/kg/h via INTRAVENOUS

## 2022-10-02 MED ORDER — HEPARIN SODIUM (PORCINE) 1000 UNIT/ML IJ SOLN
INTRAMUSCULAR | Status: AC
  Filled 2022-10-02: qty 10

## 2022-10-02 MED ORDER — FENTANYL CITRATE (PF) 100 MCG/2ML IJ SOLN
INTRAMUSCULAR | Status: DC | PRN
  Administered 2022-10-02 (×2): 25 ug via INTRAVENOUS

## 2022-10-02 MED ORDER — MIDAZOLAM HCL 2 MG/2ML IJ SOLN
INTRAMUSCULAR | Status: DC | PRN
  Administered 2022-10-02: 1 mg via INTRAVENOUS

## 2022-10-02 MED ORDER — ASPIRIN 81 MG PO CHEW
CHEWABLE_TABLET | ORAL | Status: AC
  Filled 2022-10-02: qty 1

## 2022-10-02 MED ORDER — LIDOCAINE HCL 1 % IJ SOLN
INTRAMUSCULAR | Status: AC
  Filled 2022-10-02: qty 20

## 2022-10-02 MED ORDER — HEPARIN SODIUM (PORCINE) 1000 UNIT/ML IJ SOLN
INTRAMUSCULAR | Status: DC | PRN
  Administered 2022-10-02 (×2): 5000 [IU] via INTRAVENOUS

## 2022-10-02 MED ORDER — IOHEXOL 300 MG/ML  SOLN
INTRAMUSCULAR | Status: DC | PRN
  Administered 2022-10-02: 93 mL

## 2022-10-02 MED ORDER — SODIUM CHLORIDE 0.9 % IV SOLN: INTRAVENOUS | Status: DC

## 2022-10-02 MED ORDER — ONDANSETRON HCL 4 MG/2ML IJ SOLN: 4.0000 mg | Freq: Four times a day (QID) | INTRAMUSCULAR | Status: DC | PRN

## 2022-10-02 SURGICAL SUPPLY — 16 items
CATH INFINITI 5FR ANG PIGTAIL (CATHETERS) IMPLANT
CATH INFINITI 5FR JK (CATHETERS) IMPLANT
CATH LAUNCHER 6FR EBU3.5 (CATHETERS) IMPLANT
DEVICE RAD COMP TR BAND LRG (VASCULAR PRODUCTS) IMPLANT
DRAPE BRACHIAL (DRAPES) IMPLANT
DRAPE FEMORAL ANGIO W/ POUCH (DRAPES) IMPLANT
GLIDESHEATH SLEND SS 6F .021 (SHEATH) IMPLANT
GUIDEWIRE INQWIRE 1.5J.035X260 (WIRE) IMPLANT
GUIDEWIRE PRESSURE X 175 (WIRE) IMPLANT
INQWIRE 1.5J .035X260CM (WIRE) ×2
KIT ENCORE 26 ADVANTAGE (KITS) IMPLANT
PACK CARDIAC CATH (CUSTOM PROCEDURE TRAY) ×2 IMPLANT
PROTECTION STATION PRESSURIZED (MISCELLANEOUS) ×2
SET ATX-X65L (MISCELLANEOUS) IMPLANT
STATION PROTECTION PRESSURIZED (MISCELLANEOUS) IMPLANT
TUBING CIL FLEX 10 FLL-RA (TUBING) IMPLANT

## 2022-10-02 NOTE — Interval H&P Note (Signed)
Cath Lab Visit (complete for each Cath Lab visit)  Clinical Evaluation Leading to the Procedure:   ACS: No.  Non-ACS:    Anginal Classification: CCS III  Anti-ischemic medical therapy: Minimal Therapy (1 class of medications)  Non-Invasive Test Results: High-risk stress test findings: cardiac mortality >3%/year  Prior CABG: No previous CABG      History and Physical Interval Note:  10/02/2022 9:40 AM  Ryan Gallagher  has presented today for surgery, with the diagnosis of L Cath   Abnormal cardiac CT.  The various methods of treatment have been discussed with the patient and family. After consideration of risks, benefits and other options for treatment, the patient has consented to  Procedure(s): LEFT HEART CATH AND CORONARY ANGIOGRAPHY (Left) as a surgical intervention.  The patient's history has been reviewed, patient examined, no change in status, stable for surgery.  I have reviewed the patient's chart and labs.  Questions were answered to the patient's satisfaction.     Lorine Bears

## 2022-10-03 ENCOUNTER — Encounter: Payer: Self-pay | Admitting: Cardiovascular Disease

## 2022-10-06 ENCOUNTER — Ambulatory Visit: Payer: Medicare Other | Attending: Cardiovascular Disease

## 2022-10-06 DIAGNOSIS — R0602 Shortness of breath: Secondary | ICD-10-CM | POA: Diagnosis not present

## 2022-10-06 LAB — ECHOCARDIOGRAM COMPLETE
AR max vel: 3.23 cm2
AV Area VTI: 3.19 cm2
AV Area mean vel: 3.29 cm2
AV Mean grad: 3 mmHg
AV Peak grad: 6.1 mmHg
Ao pk vel: 1.23 m/s
Area-P 1/2: 3.78 cm2
Calc EF: 59.6 %
S' Lateral: 3.1 cm
Single Plane A2C EF: 59.1 %
Single Plane A4C EF: 59.7 %

## 2022-10-09 ENCOUNTER — Ambulatory Visit (INDEPENDENT_AMBULATORY_CARE_PROVIDER_SITE_OTHER): Payer: Medicare Other | Admitting: Nurse Practitioner

## 2022-10-09 ENCOUNTER — Encounter: Payer: Self-pay | Admitting: Nurse Practitioner

## 2022-10-09 VITALS — BP 128/74 | HR 66 | Temp 98.3°F | Resp 16 | Ht 77.0 in | Wt 224.0 lb

## 2022-10-09 DIAGNOSIS — F320 Major depressive disorder, single episode, mild: Secondary | ICD-10-CM | POA: Diagnosis not present

## 2022-10-09 DIAGNOSIS — I2510 Atherosclerotic heart disease of native coronary artery without angina pectoris: Secondary | ICD-10-CM | POA: Diagnosis not present

## 2022-10-09 DIAGNOSIS — Z23 Encounter for immunization: Secondary | ICD-10-CM | POA: Diagnosis not present

## 2022-10-09 DIAGNOSIS — J432 Centrilobular emphysema: Secondary | ICD-10-CM

## 2022-10-09 MED ORDER — ZOSTER VAC RECOMB ADJUVANTED 50 MCG/0.5ML IM SUSR
0.5000 mL | Freq: Once | INTRAMUSCULAR | 0 refills | Status: AC
Start: 2022-10-09 — End: 2022-10-09

## 2022-10-09 MED ORDER — TETANUS-DIPHTH-ACELL PERTUSSIS 5-2.5-18.5 LF-MCG/0.5 IM SUSP
0.5000 mL | Freq: Once | INTRAMUSCULAR | 0 refills | Status: AC
Start: 2022-10-09 — End: 2022-10-09

## 2022-10-09 NOTE — Progress Notes (Signed)
[facility] [address] [place], [place] [address]  Internal MEDICINE  Office Visit Note  Patient Name: Ryan Gallagher  [id number]  [id number]  Date of Service: 10/09/2022  Chief Complaint  Patient presents with   Gastroesophageal Reflux   Follow-up    HPI [name] presents for a follow-up visit for COPD, CAD, depression, and vaccines COPD -- having trouble finding an inhaler that his insurance will cover that is affordable.  Had a left heart cath had some blockages but not severe enough to be stented.  Not feeling like lexapro is helping, wants to stop taking it.  Started on statin by cardiology  Due for shingles and tdap vaccines Intermittent SOB is caused by emphysema and/or heart blockages.     Current Medication: Outpatient Encounter Medications as of 10/09/2022  Medication Sig   albuterol (VENTOLIN HFA) 108 (90 Base) MCG/ACT inhaler Inhale 2 puffs into the lungs every 6 (six) hours as needed for wheezing or shortness of breath.   aspirin EC 81 MG tablet Take 1 tablet (81 mg total) by mouth daily. Swallow whole.   aspirin-acetaminophen-caffeine (EXCEDRIN MIGRAINE) 250-250-65 MG per tablet Take 1 tablet by mouth every 6 (six) hours as needed for headache.   calcium carbonate (TUMS - DOSED IN MG ELEMENTAL CALCIUM) 500 MG chewable tablet Chew 1 tablet by mouth as needed for indigestion or heartburn.   diphenhydrAMINE HCl (BENADRYL ALLERGY PO) Take by mouth.   DIPHENHYDRAMINE HCL, TOPICAL, (BENADRYL ITCH STOPPING) 2 % GEL Apply topically.   Magnesium Gluconate 500 (27 Mg) MG TABS Take 500 mg by mouth daily at 6 (six) AM.   meloxicam (MOBIC) 7.5 MG tablet Take 1 tablet (7.5 mg total) by mouth daily.   methocarbamol (ROBAXIN) 500 MG tablet Take 1 tablet (500 mg total) by mouth every 8 (eight) hours as needed for muscle spasms.   metoprolol succinate (TOPROL-XL) 25 MG 24 hr tablet Take 0.5 tablets (12.5 mg total) by mouth daily.   Multiple Vitamins-Minerals  (MULTIVITAMIN WITH MINERALS) tablet Take 1 tablet by mouth daily.   nitroGLYCERIN (NITROSTAT) 0.4 MG SL tablet Place 1 tablet (0.4 mg total) under the tongue every 5 (five) minutes as needed for chest pain.   rosuvastatin (CRESTOR) 20 MG tablet Take 1 tablet (20 mg total) by mouth daily.   vitamin B-12 (CYANOCOBALAMIN) 100 MCG tablet Take 100 mcg by mouth daily.   [DISCONTINUED] escitalopram (LEXAPRO) 20 MG tablet Take 1 tablet (20 mg total) by mouth daily.   [DISCONTINUED] Tdap (BOOSTRIX) 5-2.5-18.5 LF-MCG/0.5 injection Inject 0.5 mLs into the muscle once.   [DISCONTINUED] Zoster Vaccine Adjuvanted [facility]) injection Inject 0.5 mLs into the muscle once.   Tdap (BOOSTRIX) 5-2.5-18.5 LF-MCG/0.5 injection Inject 0.5 mLs into the muscle once for 1 dose.   Zoster Vaccine Adjuvanted [facility]) injection Inject 0.5 mLs into the muscle once for 1 dose.   No facility-administered encounter medications on file as of 10/09/2022.    Surgical History: Past Surgical History:  Procedure Laterality Date   CATARACT EXTRACTION W/PHACO Left 11/29/2021   Procedure: CATARACT EXTRACTION PHACO AND INTRAOCULAR LENS PLACEMENT (IOC) LEFT;  Surgeon: Galen Manila, MD;  Location: [facility];  Service: Ophthalmology;  Laterality: Left;  6.28 1:07.0   CORONARY PRESSURE/FFR STUDY N/A 10/02/2022   Procedure: CORONARY PRESSURE/FFR STUDY;  Surgeon: Iran Ouch, MD;  Location: [facility] INVASIVE CV LAB;  Service: Cardiovascular;  Laterality: N/A;   HOLEP-LASER ENUCLEATION OF THE PROSTATE WITH MORCELLATION N/A 04/19/2018   Procedure: HOLEP-LASER ENUCLEATION OF THE PROSTATE  WITH MORCELLATION;  Surgeon: Sondra Come, MD;  Location: [facility] ORS;  Service: Urology;  Laterality: N/A;   LEFT HEART CATH AND CORONARY ANGIOGRAPHY Left 10/02/2022   Procedure: LEFT HEART CATH AND CORONARY ANGIOGRAPHY;  Surgeon: Iran Ouch, MD;  Location: [facility] INVASIVE CV LAB;  Service: Cardiovascular;  Laterality: Left;   NO PAST  SURGERIES      Medical History: Past Medical History:  Diagnosis Date   Arthritis    COPD, mild (HCC)    Diverticulitis    Enlarged prostate    GERD (gastroesophageal reflux disease)    OCC   Headache    MIGRAINES   History of hiatal hernia     Family History: Family History  Problem Relation Age of Onset   Alzheimer's disease Mother    Heart disease Father    Alzheimer's disease Father    Diabetes Paternal Uncle    Prostate cancer Neg Hx    Bladder Cancer Neg Hx    Kidney cancer Neg Hx     Social History   Socioeconomic History   Marital status: Single    Spouse name: Not on file   Number of children: Not on file   Years of education: Not on file   Highest education level: Not on file  Occupational History   Not on file  Tobacco Use   Smoking status: Every Day    Current packs/day: 0.50    Average packs/day: 0.5 packs/day for 50.0 years (25.0 ttl pk-yrs)    Types: Cigarettes   Smokeless tobacco: Never  Vaping Use   Vaping status: Never Used  Substance and Sexual Activity   Alcohol use: Yes    Comment: occ   Drug use: Never   Sexual activity: Yes    Birth control/protection: None  Other Topics Concern   Not on file  Social History Narrative   Not on file   Social Determinants of Health   Financial Resource Strain: Not on file  Food Insecurity: Not on file  Transportation Needs: Not on file  Physical Activity: Not on file  Stress: Not on file  Social Connections: Not on file  Intimate Partner Violence: Not on file      Review of Systems  Constitutional:  Positive for activity change and fatigue. Negative for chills, diaphoresis, fever and unexpected weight change.  HENT: Negative.  Negative for congestion, rhinorrhea, sneezing and sore throat.   Eyes:  Negative for redness.  Respiratory:  Positive for shortness of breath (easily winded with minimal activity). Negative for cough, chest tightness and wheezing.   Cardiovascular: Negative.   Negative for chest pain and palpitations.  Gastrointestinal:  Negative for abdominal pain, constipation, diarrhea, nausea and vomiting.  Genitourinary:  Negative for dysuria and frequency.  Musculoskeletal:  Positive for arthralgias (chronic bilateral shoulder pain) and neck pain (chronic). Negative for back pain and joint swelling.  Skin:  Negative for rash.  Neurological: Negative.  Negative for tremors and numbness.  Hematological:  Negative for adenopathy. Does not bruise/bleed easily.  Psychiatric/Behavioral:  Positive for sleep disturbance (due to neck and shoulder pain). Negative for behavioral problems (Depression), self-injury and suicidal ideas. The patient is not nervous/anxious.     Vital Signs: BP 128/74   Pulse 66   Temp 98.3 F (36.8 C)   Resp 16   Ht 6\' 5"  (1.956 m)   Wt 224 lb (101.6 kg)   SpO2 98%   BMI 26.56 kg/m    Physical Exam Vitals reviewed.  Constitutional:  General: He is not in acute distress.    Appearance: Normal appearance. He is not ill-appearing.  HENT:     Head: Normocephalic and atraumatic.  Eyes:     Pupils: Pupils are equal, round, and reactive to light.  Cardiovascular:     Rate and Rhythm: Normal rate and regular rhythm.  Pulmonary:     Effort: Pulmonary effort is normal. No respiratory distress.  Neurological:     Mental Status: He is alert and oriented to person, place, and time.  Psychiatric:        Mood and Affect: Mood normal.        Behavior: Behavior normal.        Assessment/Plan: 1. Centrilobular emphysema (HCC) Patient and wife will call his insurance and find out which inhalers are covered and affordable and then call the clinic to let me know so it can be prescribed.   2. 3-vessel coronary artery disease Continue statin as prescribed by cardiology  3. Depression, major, single episode, mild (HCC) Wean off lexapro as discussed today -- start with 1/2 tablet daily for 1-2 weeks then stop  4. Need for  vaccination - Tdap (BOOSTRIX) 5-2.5-18.5 LF-MCG/0.5 injection; Inject 0.5 mLs into the muscle once for 1 dose.  Dispense: 0.5 mL; Refill: 0 - Zoster Vaccine Adjuvanted [facility]) injection; Inject 0.5 mLs into the muscle once for 1 dose.  Dispense: 0.5 mL; Refill: 0   General Counseling: ubaldo daywalt understanding of the findings of todays visit and agrees with plan of treatment. I have discussed any further diagnostic evaluation that may be needed or ordered today. We also reviewed his medications today. he has been encouraged to call the office with any questions or concerns that should arise related to todays visit.    No orders of the defined types were placed in this encounter.   Meds ordered this encounter  Medications   Tdap (BOOSTRIX) 5-2.5-18.5 LF-MCG/0.5 injection    Sig: Inject 0.5 mLs into the muscle once for 1 dose.    Dispense:  0.5 mL    Refill:  0   Zoster Vaccine Adjuvanted [facility] injection    Sig: Inject 0.5 mLs into the muscle once for 1 dose.    Dispense:  0.5 mL    Refill:  0    Return for previously scheduled, CPE, Stacia Feazell PCP in february 2025. Marland Kitchen   Total time spent:30 Minutes Time spent includes review of chart, medications, test results, and follow up plan with the patient.   [place] Controlled Substance Database was reviewed by me.  This patient was seen by Sallyanne Kuster, FNP-C in collaboration with Dr. Beverely Risen as a part of collaborative care agreement.   Phares Zaccone R. Tedd Sias, MSN, FNP-C Internal medicine

## 2022-10-10 ENCOUNTER — Encounter: Payer: Self-pay | Admitting: Medical

## 2022-10-10 ENCOUNTER — Ambulatory Visit: Payer: Medicare Other | Attending: Cardiology | Admitting: Medical

## 2022-10-10 VITALS — BP 108/78 | HR 62 | Ht 77.0 in | Wt 226.4 lb

## 2022-10-10 DIAGNOSIS — I25110 Atherosclerotic heart disease of native coronary artery with unstable angina pectoris: Secondary | ICD-10-CM | POA: Insufficient documentation

## 2022-10-10 DIAGNOSIS — I77810 Thoracic aortic ectasia: Secondary | ICD-10-CM | POA: Insufficient documentation

## 2022-10-10 DIAGNOSIS — Z720 Tobacco use: Secondary | ICD-10-CM | POA: Diagnosis not present

## 2022-10-10 DIAGNOSIS — E782 Mixed hyperlipidemia: Secondary | ICD-10-CM | POA: Insufficient documentation

## 2022-10-10 NOTE — Patient Instructions (Signed)
Medication Instructions:  Your physician recommends that you continue on your current medications as directed. Please refer to the Current Medication list given to you today.  *If you need a refill on your cardiac medications before your next appointment, please call your pharmacy*   Lab Work: Your provider would like for you to return in 2-4 weeks to have the following labs drawn: (Lipid).   Please go to the [facility] entrance and check in at the front desk.  You do not need an appointment.  They are open from 7am-6 pm.  You will need to be fasting.    Testing/Procedures: None ordered today   Follow-Up: At [facility], you and your health needs are our priority.  As part of our continuing mission to provide you with exceptional heart care, we have created designated Provider Care Teams.  These Care Teams include your primary Cardiologist (physician) and Advanced Practice Providers (APPs -  Physician Assistants and Nurse Practitioners) who all work together to provide you with the care you need, when you need it.  We recommend signing up for the patient portal called "MyChart".  Sign up information is provided on this After Visit Summary.  MyChart is used to connect with patients for Virtual Visits (Telemedicine).  Patients are able to view lab/test results, encounter notes, upcoming appointments, etc.  Non-urgent messages can be sent to your provider as well.   To learn more about what you can do with MyChart, go to [email or web address].    Your next appointment:   3 month(s)  Provider:   You may see Lorine Bears, MD or one of the following Advanced Practice Providers on your designated Care Team:   Nicolasa Ducking, NP Eula Listen, PA-C Cadence Fransico Michael, PA-C Charlsie Quest, NP

## 2022-10-10 NOTE — Progress Notes (Signed)
Cardiology Office Note:    Date:  10/10/2022   ID:  Ryan Gallagher, DOB Feb 12, 1956, MRN [id number]  PCP:  Sallyanne Kuster, NP  [facility] Cardiologist:  Lorine Bears, MD  [facility] Electrophysiologist:  None   Referring MD: Sallyanne Kuster, NP   Chief Complaint: Heart cath follow-up  History of Present Illness:    Ryan Gallagher is a 67 y.o. male with a hx of COPD/tobacco, CAD, ascending aortic dilation use who presents for cardiac CTA follow-up.   Patient was seen 08/18/2022 as a new patient reporting dizziness, shortness of breath and fatigue.  He reported worsening exertional shortness of breath and fatigue over the last 6 months.  He also reported associated dizziness.  CT scan of the lungs in March 2024 showed evidence of aortic and 3 vessel coronary artery calcification.  Echo and cardiac CT were ordered. Cardiac CTA showed coronary calcium score 492, 75th percentile for age and sex matched, severe proximal left circumflex stenosis greater than 70%, moderate proximal LAD stenosis. It was sent for FFR, which showed significant stenosis in the proximal left circumflex (FFR 0.78), and Cardiac cath was recommended.   Last seen 09/29/22 for Cardiac CTA was reviewed. The patient was set for cardiac cath. Cardiac cath showed moderate 60% stenosis in the mid left circumflex which was no significant by FFR, normal LVSF with mildly elevated LVEDP. No PCI was performed. Echo showed LVEF 55-60%, no WMA, aortic dilation measuring 40mm.   Today, the patient reports he is overall doing OK. Cath and echo were reviewed. He reports occasional orthostatic symptoms. Breathing is about the same. He is still smoking less than pack per day, he is wanting to quit. He has the patches to try at home.   Past Medical History:  Diagnosis Date   Arthritis    COPD, mild (HCC)    Diverticulitis    Enlarged prostate    GERD (gastroesophageal reflux disease)    OCC   Headache    MIGRAINES    History of hiatal hernia     Past Surgical History:  Procedure Laterality Date   CATARACT EXTRACTION W/PHACO Left 11/29/2021   Procedure: CATARACT EXTRACTION PHACO AND INTRAOCULAR LENS PLACEMENT (IOC) LEFT;  Surgeon: Galen Manila, MD;  Location: [facility];  Service: Ophthalmology;  Laterality: Left;  6.28 1:07.0   CORONARY PRESSURE/FFR STUDY N/A 10/02/2022   Procedure: CORONARY PRESSURE/FFR STUDY;  Surgeon: Iran Ouch, MD;  Location: [facility] INVASIVE CV LAB;  Service: Cardiovascular;  Laterality: N/A;   HOLEP-LASER ENUCLEATION OF THE PROSTATE WITH MORCELLATION N/A 04/19/2018   Procedure: HOLEP-LASER ENUCLEATION OF THE PROSTATE WITH MORCELLATION;  Surgeon: Sondra Come, MD;  Location: [facility] ORS;  Service: Urology;  Laterality: N/A;   LEFT HEART CATH AND CORONARY ANGIOGRAPHY Left 10/02/2022   Procedure: LEFT HEART CATH AND CORONARY ANGIOGRAPHY;  Surgeon: Iran Ouch, MD;  Location: [facility] INVASIVE CV LAB;  Service: Cardiovascular;  Laterality: Left;   NO PAST SURGERIES      Current Medications: Current Meds  Medication Sig   albuterol (VENTOLIN HFA) 108 (90 Base) MCG/ACT inhaler Inhale 2 puffs into the lungs every 6 (six) hours as needed for wheezing or shortness of breath.   aspirin EC 81 MG tablet Take 1 tablet (81 mg total) by mouth daily. Swallow whole.   aspirin-acetaminophen-caffeine (EXCEDRIN MIGRAINE) 250-250-65 MG per tablet Take 1 tablet by mouth every 6 (six) hours as needed for headache.   calcium carbonate (TUMS - DOSED IN MG ELEMENTAL CALCIUM)  500 MG chewable tablet Chew 1 tablet by mouth as needed for indigestion or heartburn.   diphenhydrAMINE HCl (BENADRYL ALLERGY PO) Take by mouth.   DIPHENHYDRAMINE HCL, TOPICAL, (BENADRYL ITCH STOPPING) 2 % GEL Apply topically.   Magnesium Gluconate 500 (27 Mg) MG TABS Take 500 mg by mouth daily at 6 (six) AM.   meloxicam (MOBIC) 7.5 MG tablet Take 1 tablet (7.5 mg total) by mouth daily.   methocarbamol (ROBAXIN) 500 MG  tablet Take 1 tablet (500 mg total) by mouth every 8 (eight) hours as needed for muscle spasms.   metoprolol succinate (TOPROL-XL) 25 MG 24 hr tablet Take 0.5 tablets (12.5 mg total) by mouth daily.   Multiple Vitamins-Minerals (MULTIVITAMIN WITH MINERALS) tablet Take 1 tablet by mouth daily.   nitroGLYCERIN (NITROSTAT) 0.4 MG SL tablet Place 1 tablet (0.4 mg total) under the tongue every 5 (five) minutes as needed for chest pain.   rosuvastatin (CRESTOR) 20 MG tablet Take 1 tablet (20 mg total) by mouth daily.   vitamin B-12 (CYANOCOBALAMIN) 100 MCG tablet Take 100 mcg by mouth daily.     Allergies:   Patient has no known allergies.   Social History   Socioeconomic History   Marital status: Single    Spouse name: Not on file   Number of children: Not on file   Years of education: Not on file   Highest education level: Not on file  Occupational History   Not on file  Tobacco Use   Smoking status: Every Day    Current packs/day: 0.50    Average packs/day: 0.5 packs/day for 50.0 years (25.0 ttl pk-yrs)    Types: Cigarettes   Smokeless tobacco: Never  Vaping Use   Vaping status: Never Used  Substance and Sexual Activity   Alcohol use: Yes    Comment: occ   Drug use: Never   Sexual activity: Yes    Birth control/protection: None  Other Topics Concern   Not on file  Social History Narrative   Not on file   Social Determinants of Health   Financial Resource Strain: Not on file  Food Insecurity: Not on file  Transportation Needs: Not on file  Physical Activity: Not on file  Stress: Not on file  Social Connections: Not on file     Family History: The patient's family history includes Alzheimer's disease in his father and mother; Diabetes in his paternal uncle; Heart disease in his father. There is no history of Prostate cancer, Bladder Cancer, or Kidney cancer.  ROS:   Please see the history of present illness.     All other systems reviewed and are  negative.  EKGs/Labs/Other Studies Reviewed:    The following studies were reviewed today:  LHC 09/2022   Mid Cx lesion is 60% stenosed.   Mid LAD lesion is 30% stenosed.   The left ventricular systolic function is normal.   LV end diastolic pressure is mildly elevated.   The left ventricular ejection fraction is 55-65% by visual estimate.   1.  Moderate 60% stenosis in the mid left circumflex which was not significant by flow reserve evaluation with an RFR of 0.96. 2.  Normal LV systolic function and mildly elevated left ventricular end-diastolic pressure.   Recommendations: Recommend aggressive medical therapy for nonobstructive coronary artery disease.  Echo 09/2022 1. Left ventricular ejection fraction, by estimation, is 55 to 60%. Left  ventricular ejection fraction by 2D MOD biplane is 59.6 %. The left  ventricle has normal function. The  left ventricle has no regional wall  motion abnormalities. Left ventricular  diastolic parameters were normal.   2. Right ventricular systolic function is normal. The right ventricular  size is normal.   3. The mitral valve is normal in structure. No evidence of mitral valve  regurgitation.   4. The aortic valve is tricuspid. Aortic valve regurgitation is not  visualized.   5. Aortic dilatation noted. There is mild dilatation of the ascending  aorta, measuring 40 mm.   6. The inferior vena cava is normal in size with greater than 50%  respiratory variability, suggesting right atrial pressure of 3 mmHg.   Comparison(s): LHC 10/02/22.   EKG:  EKG is ordered today.  The ekg ordered today demonstrates NSR, 62bpm, nonspecific T wave changes  Recent Labs: 05/03/2022: ALT 21; TSH 0.838 09/29/2022: BUN 18; Creatinine, Ser 0.81; Hemoglobin 15.0; Platelets 255; Potassium 4.1; Sodium 135  Recent Lipid Panel    Component Value Date/Time   CHOL 225 (H) 08/18/2022 1051   TRIG 262 (H) 08/18/2022 1051   HDL 33 (L) 08/18/2022 1051   CHOLHDL 6.8  08/18/2022 1051   VLDL 52 (H) 08/18/2022 1051   LDLCALC 140 (H) 08/18/2022 1051    Physical Exam:    VS:  BP 108/78 (BP Location: Left Arm, Patient Position: Sitting, Cuff Size: Normal)   Pulse 62   Ht 6\' 5"  (1.956 m)   Wt 226 lb 6.4 oz (102.7 kg)   SpO2 96%   BMI 26.85 kg/m     Wt Readings from Last 3 Encounters:  10/10/22 226 lb 6.4 oz (102.7 kg)  10/09/22 224 lb (101.6 kg)  10/02/22 227 lb (103 kg)     GEN:  Well nourished, well developed in no acute distress HEENT: Normal NECK: No JVD; No carotid bruits LYMPHATICS: No lymphadenopathy CARDIAC: RRR, no murmurs, rubs, gallops RESPIRATORY:  Clear to auscultation without rales, wheezing or rhonchi  ABDOMEN: Soft, non-tender, non-distended MUSCULOSKELETAL:  No edema; No deformity  SKIN: Warm and dry NEUROLOGIC:  Alert and oriented x 3 PSYCHIATRIC:  Normal affect   ASSESSMENT:    1. Coronary artery disease involving native coronary artery of native heart with unstable angina pectoris (HCC)   2. Hyperlipidemia, mixed   3. Tobacco use   4. Ascending aorta dilation (HCC)    PLAN:    In order of problems listed above:  Nonobstructive CAD Heart cath showed nonobstructive CAD with 60% mid LCx, no PCI performed. Echo showed LVEF 55-60%, normal RVSF.The cath site is stable. The patient denies chest pain. He still has exertional SOB, but this is tolerable. Echo showed LVEF 55-60%, no WMA. Continue Aspirin, SL NTG and toprol. No further ischemic work-up at this time.   HLD LDL 140, TG 262, HDL 33, total chol 140. Patient is on Crestor 20mg  daily. I will place in standing orders for fasting lipid panel/LFTs in 2-4 weeks.   Tobacco use He is smoking less than 1/2 ppd. He has patches to try at home.   Ascending aortic dilation Ascending aortic dilation 40mm.   Disposition: Follow up in 3 month(s) with MD/APP    Signed, Kayl Stogdill David Stall, PA-C  10/10/2022 11:12 AM    [facility]

## 2022-10-13 ENCOUNTER — Ambulatory Visit: Payer: Medicare Other | Admitting: Cardiology

## 2022-11-23 ENCOUNTER — Ambulatory Visit: Payer: Medicare Other | Admitting: Cardiovascular Disease

## 2022-12-08 DIAGNOSIS — Z23 Encounter for immunization: Secondary | ICD-10-CM | POA: Diagnosis not present

## 2023-01-11 ENCOUNTER — Encounter: Payer: Self-pay | Admitting: Cardiovascular Disease

## 2023-01-11 ENCOUNTER — Ambulatory Visit: Payer: Medicare Other | Attending: Cardiovascular Disease | Admitting: Cardiovascular Disease

## 2023-01-11 VITALS — BP 100/72 | HR 66 | Ht 77.0 in | Wt 228.1 lb

## 2023-01-11 DIAGNOSIS — I77810 Thoracic aortic ectasia: Secondary | ICD-10-CM | POA: Diagnosis not present

## 2023-01-11 DIAGNOSIS — E785 Hyperlipidemia, unspecified: Secondary | ICD-10-CM | POA: Insufficient documentation

## 2023-01-11 DIAGNOSIS — Z720 Tobacco use: Secondary | ICD-10-CM | POA: Insufficient documentation

## 2023-01-11 DIAGNOSIS — I2510 Atherosclerotic heart disease of native coronary artery without angina pectoris: Secondary | ICD-10-CM | POA: Diagnosis not present

## 2023-01-11 NOTE — Patient Instructions (Signed)
Medication Instructions:  STOP the Metoprolol (Toprol)  *If you need a refill on your cardiac medications before your next appointment, please call your pharmacy*   Lab Work: Your provider would like for you to have following labs drawn today Lipid and Liver.   If you have labs (blood work) drawn today and your tests are completely normal, you will receive your results only by: MyChart Message (if you have MyChart) OR A paper copy in the mail If you have any lab test that is abnormal or we need to change your treatment, we will call you to review the results.   Testing/Procedures: None ordered   Follow-Up: At [facility], you and your health needs are our priority.  As part of our continuing mission to provide you with exceptional heart care, we have created designated Provider Care Teams.  These Care Teams include your primary Cardiologist (physician) and Advanced Practice Providers (APPs -  Physician Assistants and Nurse Practitioners) who all work together to provide you with the care you need, when you need it.  We recommend signing up for the patient portal called "MyChart".  Sign up information is provided on this After Visit Summary.  MyChart is used to connect with patients for Virtual Visits (Telemedicine).  Patients are able to view lab/test results, encounter notes, upcoming appointments, etc.  Non-urgent messages can be sent to your provider as well.   To learn more about what you can do with MyChart, go to [email or web address].    Your next appointment:   6 month(s)  Provider:   You may see Lorine Bears, MD or one of the following Advanced Practice Providers on your designated Care Team:   Nicolasa Ducking, NP Eula Listen, PA-C Cadence Fransico Michael, PA-C Charlsie Quest, NP

## 2023-01-11 NOTE — Progress Notes (Signed)
Cardiology Office Note   Date:  01/11/2023   ID:  Ryan Gallagher, DOB [birth year], MRN [id number]  PCP:  Sallyanne Kuster, NP  Cardiologist:   Lorine Bears, MD   Chief Complaint  Patient presents with   Follow-up    3 Month f/u no complaints today. Meds reviewed verbally with pt.      History of Present Illness: Ryan Gallagher is a 67 y.o. male who is here today for a follow-up visit regarding moderate nonobstructive coronary artery disease.    He has prolonged history of tobacco use and has been smoking half a pack per day for 45 years.  He has no family history of premature coronary artery disease. He does have COPD and uses inhalers.  He was seen for increased exertional dyspnea without chest pain.  Cardiac CTA showed a calcium score of 492 with significant stenosis in the proximal left circumflex and moderate disease in the LAD.  I proceeded with left heart catheterization in July of this year which showed a 60% stenosis of the mid left circumflex with an RFR ratio of 0.96.  Thus, PCI was deferred.  The mid LAD had 30% stenosis.  Echocardiogram showed normal LV systolic function and no significant valvular abnormalities.  He has been doing well with no reported chest pain.  His dyspnea is stable.  He does complain of mild dizziness when standing up.    Past Medical History:  Diagnosis Date   Arthritis    COPD, mild (HCC)    Diverticulitis    Enlarged prostate    GERD (gastroesophageal reflux disease)    OCC   Headache    MIGRAINES   History of hiatal hernia     Past Surgical History:  Procedure Laterality Date   CATARACT EXTRACTION W/PHACO Left 11/29/2021   Procedure: CATARACT EXTRACTION PHACO AND INTRAOCULAR LENS PLACEMENT (IOC) LEFT;  Surgeon: Galen Manila, MD;  Location: [facility];  Service: Ophthalmology;  Laterality: Left;  6.28 1:07.0   CORONARY PRESSURE/FFR STUDY N/A 10/02/2022   Procedure: CORONARY PRESSURE/FFR STUDY;  Surgeon: Iran Ouch, MD;  Location: [facility];  Service: Cardiovascular;  Laterality: N/A;   HOLEP-LASER ENUCLEATION OF THE PROSTATE WITH MORCELLATION N/A 04/19/2018   Procedure: HOLEP-LASER ENUCLEATION OF THE PROSTATE WITH MORCELLATION;  Surgeon: Sondra Come, MD;  Location: [facility];  Service: Urology;  Laterality: N/A;   LEFT HEART CATH AND CORONARY ANGIOGRAPHY Left 10/02/2022   Procedure: LEFT HEART CATH AND CORONARY ANGIOGRAPHY;  Surgeon: Iran Ouch, MD;  Location: [facility];  Service: Cardiovascular;  Laterality: Left;   NO PAST SURGERIES       Current Outpatient Medications  Medication Sig Dispense Refill   albuterol (VENTOLIN HFA) 108 (90 Base) MCG/ACT inhaler Inhale 2 puffs into the lungs every 6 (six) hours as needed for wheezing or shortness of breath. 8 g 2   aspirin EC 81 MG tablet Take 1 tablet (81 mg total) by mouth daily. Swallow whole. 90 tablet 3   aspirin-acetaminophen-caffeine (EXCEDRIN MIGRAINE) 250-250-65 MG per tablet Take 1 tablet by mouth every 6 (six) hours as needed for headache.     calcium carbonate (TUMS - DOSED IN MG ELEMENTAL CALCIUM) 500 MG chewable tablet Chew 1 tablet by mouth as needed for indigestion or heartburn.     diphenhydrAMINE HCl (BENADRYL ALLERGY PO) Take by mouth.     DIPHENHYDRAMINE HCL, TOPICAL, (BENADRYL ITCH STOPPING) 2 % GEL Apply topically.  Magnesium Gluconate 500 (27 Mg) MG TABS Take 500 mg by mouth daily at 6 (six) AM.     meloxicam (MOBIC) 7.5 MG tablet Take 1 tablet (7.5 mg total) by mouth daily. 30 tablet 3   methocarbamol (ROBAXIN) 500 MG tablet Take 1 tablet (500 mg total) by mouth every 8 (eight) hours as needed for muscle spasms. 90 tablet 0   metoprolol succinate (TOPROL-XL) 25 MG 24 hr tablet Take 0.5 tablets (12.5 mg total) by mouth daily. 45 tablet 0   Multiple Vitamins-Minerals (MULTIVITAMIN WITH MINERALS) tablet Take 1 tablet by mouth daily.     nitroGLYCERIN (NITROSTAT) 0.4 MG SL tablet Place 1 tablet  (0.4 mg total) under the tongue every 5 (five) minutes as needed for chest pain. 25 tablet 0   rosuvastatin (CRESTOR) 20 MG tablet Take 1 tablet (20 mg total) by mouth daily. 90 tablet 3   vitamin B-12 (CYANOCOBALAMIN) 100 MCG tablet Take 100 mcg by mouth daily.     No current facility-administered medications for this visit.    Allergies:   Patient has no known allergies.    Social History:  The patient  reports that he has been smoking cigarettes. He has a 25 pack-year smoking history. He has never used smokeless tobacco. He reports current alcohol use. He reports that he does not use drugs.   Family History:  The patient's family history includes Alzheimer's disease in his father and mother; Diabetes in his paternal uncle; Heart disease in his father.    ROS:  Please see the history of present illness.   Otherwise, review of systems are positive for none.   All other systems are reviewed and negative.    PHYSICAL EXAM: VS:  BP 100/72 (BP Location: Left Arm, Patient Position: Sitting, Cuff Size: Large)   Pulse 66   Ht 6\' 5"  (1.956 m)   Wt 228 lb 2 oz (103.5 kg)   SpO2 98%   BMI 27.05 kg/m  , BMI Body mass index is 27.05 kg/m. GEN: Well nourished, well developed, in no acute distress  HEENT: normal  Neck: no JVD, carotid bruits, or masses Cardiac: RRR; no murmurs, rubs, or gallops,no edema  Respiratory:  clear to auscultation bilaterally, normal work of breathing GI: soft, nontender, nondistended, + BS MS: no deformity or atrophy  Skin: warm and dry, no rash Neuro:  Strength and sensation are intact Psych: euthymic mood, full affect Vascular: Distal pulses are normal.   EKG:  EKG is not ordered today.    Recent Labs: 05/03/2022: ALT 21; TSH 0.838 09/29/2022: BUN 18; Creatinine, Ser 0.81; Hemoglobin 15.0; Platelets 255; Potassium 4.1; Sodium 135    Lipid Panel    Component Value Date/Time   CHOL 225 (H) 08/18/2022 1051   TRIG 262 (H) 08/18/2022 1051   HDL 33 (L)  08/18/2022 1051   CHOLHDL 6.8 08/18/2022 1051   VLDL 52 (H) 08/18/2022 1051   LDLCALC 140 (H) 08/18/2022 1051      Wt Readings from Last 3 Encounters:  01/11/23 228 lb 2 oz (103.5 kg)  10/10/22 226 lb 6.4 oz (102.7 kg)  10/09/22 224 lb (101.6 kg)          08/18/2022    9:52 AM  PAD Screen  Previous PAD dx? No  Previous surgical procedure? No  Pain with walking? Yes  Subsides with rest? No  Feet/toe relief with dangling? Yes  Painful, non-healing ulcers? No  Extremities discolored? No      ASSESSMENT AND PLAN:  1.  Coronary artery disease involving native coronary arteries without angina: I reviewed the cardiac cath images with him.  He does have 60% stenosis in the mid left circumflex which will be treated medically for now.  Continue aspirin indefinitely.   Given that his ejection fraction is normal and he has no angina, I elected to discontinue small dose metoprolol.  2.  Dilated ascending aorta: His aortic root was mildly dilated by echo at 40 mm.  I measured his ascending aorta on CTA images and it was only 37 mm.  This is likely in the upper range of normal considering his BSA.   3.  Tobacco use: I discussed with him the importance of smoking cessation.  4.  Hyperlipidemia: Recommend a target LDL of less than 55.  He was started on rosuvastatin at his initial diagnosis of CAD and has been tolerating the medication.  I requested a follow-up lipid and liver profile.    Disposition:   FU with me in 6 months  Signed,  Lorine Bears, MD  01/11/2023 8:20 AM    [facility]

## 2023-01-12 LAB — LIPID PANEL
Chol/HDL Ratio: 4.2 {ratio} (ref 0.0–5.0)
Cholesterol, Total: 154 mg/dL (ref 100–199)
HDL: 37 mg/dL — ABNORMAL LOW (ref >39)
LDL Chol Calc (NIH): 94 mg/dL (ref 0–99)
Triglycerides: 131 mg/dL (ref 0–149)
VLDL Cholesterol Cal: 23 mg/dL (ref 5–40)

## 2023-01-12 LAB — HEPATIC FUNCTION PANEL
ALT: 22 [IU]/L (ref 0–44)
AST: 24 [IU]/L (ref 0–40)
Albumin: 4.2 g/dL (ref 3.9–4.9)
Alkaline Phosphatase: 80 [IU]/L (ref 44–121)
Bilirubin Total: 0.4 mg/dL (ref 0.0–1.2)
Bilirubin, Direct: 0.12 mg/dL (ref 0.00–0.40)
Total Protein: 6.6 g/dL (ref 6.0–8.5)

## 2023-01-19 ENCOUNTER — Other Ambulatory Visit: Payer: Self-pay | Admitting: *Deleted

## 2023-01-19 DIAGNOSIS — E785 Hyperlipidemia, unspecified: Secondary | ICD-10-CM

## 2023-01-19 DIAGNOSIS — I2510 Atherosclerotic heart disease of native coronary artery without angina pectoris: Secondary | ICD-10-CM

## 2023-01-19 MED ORDER — ROSUVASTATIN CALCIUM 40 MG PO TABS: 40.0000 mg | ORAL_TABLET | Freq: Every day | ORAL | 3 refills | Status: DC

## 2023-01-19 MED ORDER — EZETIMIBE 10 MG PO TABS: 10.0000 mg | ORAL_TABLET | Freq: Every day | ORAL | 3 refills | Status: DC

## 2023-02-26 DIAGNOSIS — H0012 Chalazion right lower eyelid: Secondary | ICD-10-CM | POA: Diagnosis not present

## 2023-03-12 ENCOUNTER — Telehealth: Payer: Self-pay | Admitting: Nurse Practitioner

## 2023-03-12 NOTE — Telephone Encounter (Signed)
Lvm and sent message to schedule surgical clearance appointment-Toni

## 2023-04-03 ENCOUNTER — Telehealth: Payer: Self-pay | Admitting: Nurse Practitioner

## 2023-04-03 ENCOUNTER — Encounter: Payer: Self-pay | Admitting: Nurse Practitioner

## 2023-04-03 ENCOUNTER — Ambulatory Visit (INDEPENDENT_AMBULATORY_CARE_PROVIDER_SITE_OTHER): Payer: Medicare Other | Admitting: Nurse Practitioner

## 2023-04-03 VITALS — BP 130/70 | HR 63 | Temp 97.4°F | Resp 16 | Ht 77.0 in | Wt 233.8 lb

## 2023-04-03 DIAGNOSIS — Z01818 Encounter for other preprocedural examination: Secondary | ICD-10-CM | POA: Diagnosis not present

## 2023-04-03 DIAGNOSIS — J432 Centrilobular emphysema: Secondary | ICD-10-CM | POA: Diagnosis not present

## 2023-04-03 DIAGNOSIS — R0602 Shortness of breath: Secondary | ICD-10-CM

## 2023-04-03 MED ORDER — ALBUTEROL SULFATE HFA 108 (90 BASE) MCG/ACT IN AERS
2.0000 | INHALATION_SPRAY | Freq: Four times a day (QID) | RESPIRATORY_TRACT | 2 refills | Status: AC | PRN
Start: 2023-04-03 — End: ?

## 2023-04-03 NOTE — Progress Notes (Signed)
 [facility] [address] [place], [place] [address]  Internal MEDICINE  Office Visit Note  Patient Name: Ryan Gallagher  [id number]  [id number]  Date of Service: 04/03/2023  Chief Complaint  Patient presents with   Gastroesophageal Reflux   Follow-up    GI surgical clearance    HPI Ryan Gallagher presents for a follow-up visit for clearance for GI procedure Possible routine colonoscopy, wants to wait until march possibly  Needs pulm clearance.  Spirometry done today, FEV1 was 68% which is no change from PFT done in march 2024.      Current Medication: Outpatient Encounter Medications as of 04/03/2023  Medication Sig   aspirin  EC 81 MG tablet Take 1 tablet (81 mg total) by mouth daily. Swallow whole.   aspirin -acetaminophen -caffeine  (EXCEDRIN  MIGRAINE) 250-250-65 MG per tablet Take 1 tablet by mouth every 6 (six) hours as needed for headache.   calcium  carbonate (TUMS - DOSED IN MG ELEMENTAL CALCIUM ) 500 MG chewable tablet Chew 1 tablet by mouth as needed for indigestion or heartburn.   diphenhydrAMINE HCl (BENADRYL ALLERGY PO) Take by mouth.   DIPHENHYDRAMINE HCL, TOPICAL, (BENADRYL ITCH STOPPING) 2 % GEL Apply topically.   ezetimibe  (ZETIA ) 10 MG tablet Take 1 tablet (10 mg total) by mouth daily.   Magnesium  Gluconate 500 (27 Mg) MG TABS Take 500 mg by mouth daily at 6 (six) AM.   meloxicam  (MOBIC ) 7.5 MG tablet Take 1 tablet (7.5 mg total) by mouth daily.   methocarbamol  (ROBAXIN ) 500 MG tablet Take 1 tablet (500 mg total) by mouth every 8 (eight) hours as needed for muscle spasms.   Multiple Vitamins-Minerals (MULTIVITAMIN WITH MINERALS) tablet Take 1 tablet by mouth daily.   rosuvastatin  (CRESTOR ) 40 MG tablet Take 1 tablet (40 mg total) by mouth daily.   vitamin B-12 (CYANOCOBALAMIN ) 100 MCG tablet Take 100 mcg by mouth daily.   [DISCONTINUED] albuterol  (VENTOLIN  HFA) 108 (90 Base) MCG/ACT inhaler Inhale 2 puffs into the lungs every 6 (six) hours as needed for  wheezing or shortness of breath.   albuterol  (VENTOLIN  HFA) 108 (90 Base) MCG/ACT inhaler Inhale 2 puffs into the lungs every 6 (six) hours as needed for wheezing or shortness of breath.   nitroGLYCERIN  (NITROSTAT ) 0.4 MG SL tablet Place 1 tablet (0.4 mg total) under the tongue every 5 (five) minutes as needed for chest pain.   No facility-administered encounter medications on file as of 04/03/2023.    Surgical History: Past Surgical History:  Procedure Laterality Date   CATARACT EXTRACTION W/PHACO Left 11/29/2021   Procedure: CATARACT EXTRACTION PHACO AND INTRAOCULAR LENS PLACEMENT (IOC) LEFT;  Surgeon: Jaye Fallow, MD;  Location: [facility];  Service: Ophthalmology;  Laterality: Left;  6.28 1:07.0   CORONARY PRESSURE/FFR STUDY N/A 10/02/2022   Procedure: CORONARY PRESSURE/FFR STUDY;  Surgeon: Darron Deatrice LABOR, MD;  Location: [facility];  Service: Cardiovascular;  Laterality: N/A;   HOLEP-LASER ENUCLEATION OF THE PROSTATE WITH MORCELLATION N/A 04/19/2018   Procedure: HOLEP-LASER ENUCLEATION OF THE PROSTATE WITH MORCELLATION;  Surgeon: Francisca Redell BROCKS, MD;  Location: [facility];  Service: Urology;  Laterality: N/A;   LEFT HEART CATH AND CORONARY ANGIOGRAPHY Left 10/02/2022   Procedure: LEFT HEART CATH AND CORONARY ANGIOGRAPHY;  Surgeon: Darron Deatrice LABOR, MD;  Location: [facility];  Service: Cardiovascular;  Laterality: Left;   NO PAST SURGERIES      Medical History: Past Medical History:  Diagnosis Date   Arthritis    COPD, mild (HCC)  Diverticulitis    Enlarged prostate    GERD (gastroesophageal reflux disease)    OCC   Headache    MIGRAINES   History of hiatal hernia     Family History: Family History  Problem Relation Age of Onset   Alzheimer's disease Mother    Heart disease Father    Alzheimer's disease Father    Diabetes Paternal Uncle    Prostate cancer Neg Hx    Bladder Cancer Neg Hx    Kidney cancer Neg Hx     Social History    Socioeconomic History   Marital status: Single    Spouse name: Not on file   Number of children: Not on file   Years of education: Not on file   Highest education level: Not on file  Occupational History   Not on file  Tobacco Use   Smoking status: Every Day    Current packs/day: 0.50    Average packs/day: 0.5 packs/day for 50.0 years (25.0 ttl pk-yrs)    Types: Cigarettes   Smokeless tobacco: Never  Vaping Use   Vaping status: Never Used  Substance and Sexual Activity   Alcohol use: Yes    Comment: occ   Drug use: Never   Sexual activity: Yes    Birth control/protection: None  Other Topics Concern   Not on file  Social History Narrative   Not on file   Social Drivers of Health   Financial Resource Strain: Not on file  Food Insecurity: Not on file  Transportation Needs: Not on file  Physical Activity: Not on file  Stress: Not on file  Social Connections: Not on file  Intimate Partner Violence: Not on file      Review of Systems  Constitutional: Negative.  Negative for fatigue and fever.  HENT: Negative.    Respiratory:  Positive for shortness of breath (intermittent). Negative for cough, chest tightness and wheezing.   Cardiovascular: Negative.  Negative for chest pain and palpitations.  Gastrointestinal: Negative.   Neurological: Negative.  Negative for headaches.    Vital Signs: BP 130/70   Pulse 63   Temp (!) 97.4 F (36.3 C)   Resp 16   Ht 6' 5 (1.956 m)   Wt 233 lb 12.8 oz (106.1 kg)   SpO2 98%   PF (!) 4 L/min   BMI 27.72 kg/m    Physical Exam Constitutional:      General: He is not in acute distress.    Appearance: Normal appearance. He is not ill-appearing or diaphoretic.  HENT:     Head: Normocephalic and atraumatic.  Eyes:     Pupils: Pupils are equal, round, and reactive to light.  Cardiovascular:     Rate and Rhythm: Normal rate and regular rhythm.     Heart sounds: Normal heart sounds. No murmur heard. Pulmonary:     Effort:  Pulmonary effort is normal. No respiratory distress.     Breath sounds: Normal breath sounds. No wheezing.  Neurological:     Mental Status: He is alert and oriented to person, place, and time.  Psychiatric:        Mood and Affect: Mood normal.        Behavior: Behavior normal.        Assessment/Plan: 1. Centrilobular emphysema (HCC) (Primary) Evelyn done today, continue prn albuterol  inhaler as prescribed, refills ordered  - albuterol  (VENTOLIN  HFA) 108 (90 Base) MCG/ACT inhaler; Inhale 2 puffs into the lungs every 6 (six) hours as needed for wheezing  or shortness of breath.  Dispense: 8 g; Refill: 2  2. Shortness of breath Spiro done, FEV1 is stable compared to PFT done in march last year  - Spirometry with Graph  3. Preoperative clearance Cleared for GI procedure    General Counseling: Vinie oakland understanding of the findings of todays visit and agrees with plan of treatment. I have discussed any further diagnostic evaluation that may be needed or ordered today. We also reviewed his medications today. he has been encouraged to call the office with any questions or concerns that should arise related to todays visit.    Orders Placed This Encounter  Procedures   Spirometry with Graph    Meds ordered this encounter  Medications   albuterol  (VENTOLIN  HFA) 108 (90 Base) MCG/ACT inhaler    Sig: Inhale 2 puffs into the lungs every 6 (six) hours as needed for wheezing or shortness of breath.    Dispense:  8 g    Refill:  2    Return for previously scheduled, AWV, Seif Teichert PCP in february. .   Total time spent:30 Minutes Time spent includes review of chart, medications, test results, and follow up plan with the patient.   [place] Controlled Substance Database was reviewed by me.  This patient was seen by Mardy Maxin, FNP-C in collaboration with Dr. Sigrid Bathe as a part of collaborative care agreement.   Etoy Mcdonnell R. Maxin, MSN, FNP-C Internal medicine

## 2023-04-03 NOTE — Telephone Encounter (Signed)
 Completed surgical clearance faxed back to [facility]; [phone]. Sent to be scanned-Toni

## 2023-05-09 ENCOUNTER — Ambulatory Visit (INDEPENDENT_AMBULATORY_CARE_PROVIDER_SITE_OTHER): Payer: Medicare Other | Admitting: Nurse Practitioner

## 2023-05-09 ENCOUNTER — Encounter: Payer: Self-pay | Admitting: Nurse Practitioner

## 2023-05-09 ENCOUNTER — Telehealth: Payer: Self-pay | Admitting: Nurse Practitioner

## 2023-05-09 VITALS — BP 132/74 | HR 69 | Temp 98.3°F | Resp 16 | Ht 77.0 in | Wt 230.2 lb

## 2023-05-09 DIAGNOSIS — I2510 Atherosclerotic heart disease of native coronary artery without angina pectoris: Secondary | ICD-10-CM | POA: Diagnosis not present

## 2023-05-09 DIAGNOSIS — E782 Mixed hyperlipidemia: Secondary | ICD-10-CM | POA: Diagnosis not present

## 2023-05-09 DIAGNOSIS — R195 Other fecal abnormalities: Secondary | ICD-10-CM

## 2023-05-09 DIAGNOSIS — F17210 Nicotine dependence, cigarettes, uncomplicated: Secondary | ICD-10-CM | POA: Diagnosis not present

## 2023-05-09 DIAGNOSIS — R7303 Prediabetes: Secondary | ICD-10-CM | POA: Diagnosis not present

## 2023-05-09 DIAGNOSIS — J432 Centrilobular emphysema: Secondary | ICD-10-CM | POA: Diagnosis not present

## 2023-05-09 DIAGNOSIS — Z0000 Encounter for general adult medical examination without abnormal findings: Secondary | ICD-10-CM

## 2023-05-09 MED ORDER — VARENICLINE TARTRATE (STARTER) 0.5 MG X 11 & 1 MG X 42 PO TBPK: ORAL_TABLET | ORAL | 0 refills | Status: DC

## 2023-05-09 NOTE — Telephone Encounter (Signed)
GI referral sent via Epic to [facility]-Toni
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
## 2023-05-16 ENCOUNTER — Telehealth: Payer: Self-pay | Admitting: *Deleted

## 2023-05-16 NOTE — Telephone Encounter (Signed)
 Left message to call back to schedule tele pre op appt.

## 2023-05-16 NOTE — Telephone Encounter (Signed)
   Pre-operative Risk Assessment    Patient Name: Ryan Gallagher  DOB: [birth year] MRN: [id number]   Date of last office visit: 01/11/23 DR. ARIDA Date of next office visit: NONE   Request for Surgical Clearance    Procedure:   COLONOSCOPY  Date of Surgery:  Clearance TBD                                Surgeon: NOT LISTED Surgeon's Group or Practice Name:  [facility] Phone number:  [phone] ATTN: [name], CMA Fax number:  [phone]   Type of Clearance Requested:   - Medical ; PER CLEARANCE REQUEST  NO MEDS TO BE HELD   Type of Anesthesia:  General    Additional requests/questions:    [name]   05/16/2023, 8:16 AM

## 2023-05-16 NOTE — Telephone Encounter (Signed)
   Name: Ryan Gallagher  DOB: [birth year]  MRN: [id number]  Primary Cardiologist: Lorine Bears, MD  Chart reviewed as part of pre-operative protocol coverage. Because of [name]'s past medical history and time since last visit, he will require a follow-up telephone visit in order to better assess preoperative cardiovascular risk.  Pre-op covering staff: - Please schedule appointment and call patient to inform them. If patient already had an upcoming appointment within acceptable timeframe, please add "pre-op clearance" to the appointment notes so provider is aware. - Please contact requesting surgeon's office via preferred method (i.e, phone, fax) to inform them of need for appointment prior to surgery.  No medications indicated on the request needing to be held.  Patient is on aspirin and would prefer aspirin to be continued throughout.  Sharlene Dory, PA-C  05/16/2023, 8:59 AM

## 2023-05-17 NOTE — Telephone Encounter (Signed)
2nd attempt to reach the pt to schedule tele preop appt., no answer.

## 2023-05-18 NOTE — Telephone Encounter (Signed)
Third attempt trying to contact patient to schedule telephone appt for preop clearance no answer left a detailed message to call our office back to schedule. It is third attempt will remove patient from our preop pool and will make requesting office aware.

## 2023-06-13 ENCOUNTER — Encounter: Payer: Self-pay | Admitting: Nurse Practitioner

## 2023-06-13 ENCOUNTER — Other Ambulatory Visit: Payer: Self-pay

## 2023-06-13 ENCOUNTER — Emergency Department

## 2023-06-13 ENCOUNTER — Encounter: Payer: Self-pay | Admitting: Certified Registered"

## 2023-06-13 ENCOUNTER — Inpatient Hospital Stay
Admission: EM | Admit: 2023-06-13 | Discharge: 2023-06-14 | DRG: 322 | Disposition: A | Discharge status: Home / Self Care | Attending: Internal Medicine | Admitting: Internal Medicine

## 2023-06-13 ENCOUNTER — Encounter
Admission: EM | Disposition: A | Discharge status: Home / Self Care | Payer: Self-pay | Source: Home / Self Care | Attending: Cardiology

## 2023-06-13 DIAGNOSIS — Z7982 Long term (current) use of aspirin: Secondary | ICD-10-CM

## 2023-06-13 DIAGNOSIS — I25110 Atherosclerotic heart disease of native coronary artery with unstable angina pectoris: Secondary | ICD-10-CM | POA: Diagnosis not present

## 2023-06-13 DIAGNOSIS — I213 ST elevation (STEMI) myocardial infarction of unspecified site: Principal | ICD-10-CM | POA: Diagnosis present

## 2023-06-13 DIAGNOSIS — Z820 Family history of epilepsy and other diseases of the nervous system: Secondary | ICD-10-CM

## 2023-06-13 DIAGNOSIS — N401 Enlarged prostate with lower urinary tract symptoms: Secondary | ICD-10-CM | POA: Diagnosis present

## 2023-06-13 DIAGNOSIS — Z8249 Family history of ischemic heart disease and other diseases of the circulatory system: Secondary | ICD-10-CM | POA: Diagnosis not present

## 2023-06-13 DIAGNOSIS — N138 Other obstructive and reflux uropathy: Secondary | ICD-10-CM | POA: Diagnosis not present

## 2023-06-13 DIAGNOSIS — I2129 ST elevation (STEMI) myocardial infarction involving other sites: Principal | ICD-10-CM | POA: Diagnosis present

## 2023-06-13 DIAGNOSIS — F17210 Nicotine dependence, cigarettes, uncomplicated: Secondary | ICD-10-CM | POA: Diagnosis present

## 2023-06-13 DIAGNOSIS — E782 Mixed hyperlipidemia: Secondary | ICD-10-CM | POA: Diagnosis not present

## 2023-06-13 DIAGNOSIS — M1990 Unspecified osteoarthritis, unspecified site: Secondary | ICD-10-CM | POA: Diagnosis present

## 2023-06-13 DIAGNOSIS — F17200 Nicotine dependence, unspecified, uncomplicated: Secondary | ICD-10-CM | POA: Diagnosis present

## 2023-06-13 DIAGNOSIS — Z833 Family history of diabetes mellitus: Secondary | ICD-10-CM

## 2023-06-13 DIAGNOSIS — I472 Ventricular tachycardia, unspecified: Secondary | ICD-10-CM | POA: Diagnosis not present

## 2023-06-13 DIAGNOSIS — E785 Hyperlipidemia, unspecified: Secondary | ICD-10-CM | POA: Diagnosis present

## 2023-06-13 DIAGNOSIS — J449 Chronic obstructive pulmonary disease, unspecified: Secondary | ICD-10-CM | POA: Diagnosis not present

## 2023-06-13 DIAGNOSIS — Z791 Long term (current) use of non-steroidal anti-inflammatories (NSAID): Secondary | ICD-10-CM | POA: Diagnosis not present

## 2023-06-13 DIAGNOSIS — R0789 Other chest pain: Secondary | ICD-10-CM | POA: Diagnosis not present

## 2023-06-13 DIAGNOSIS — Z79899 Other long term (current) drug therapy: Secondary | ICD-10-CM

## 2023-06-13 DIAGNOSIS — K219 Gastro-esophageal reflux disease without esophagitis: Secondary | ICD-10-CM | POA: Diagnosis present

## 2023-06-13 DIAGNOSIS — F17219 Nicotine dependence, cigarettes, with unspecified nicotine-induced disorders: Secondary | ICD-10-CM | POA: Diagnosis not present

## 2023-06-13 DIAGNOSIS — R079 Chest pain, unspecified: Secondary | ICD-10-CM | POA: Diagnosis not present

## 2023-06-13 DIAGNOSIS — I499 Cardiac arrhythmia, unspecified: Secondary | ICD-10-CM | POA: Diagnosis not present

## 2023-06-13 HISTORY — PX: LEFT HEART CATH AND CORONARY ANGIOGRAPHY: CATH118249

## 2023-06-13 HISTORY — PX: CORONARY/GRAFT ACUTE MI REVASCULARIZATION: CATH118305

## 2023-06-13 LAB — BASIC METABOLIC PANEL
Anion gap: 8 (ref 5–15)
BUN: 16 mg/dL (ref 8–23)
CO2: 26 mmol/L (ref 22–32)
Calcium: 9.4 mg/dL (ref 8.9–10.3)
Chloride: 101 mmol/L (ref 98–111)
Creatinine, Ser: 1.01 mg/dL (ref 0.61–1.24)
GFR, Estimated: >60 mL/min (ref >60)
Glucose, Bld: 104 mg/dL — ABNORMAL HIGH (ref 70–99)
Potassium: 4.6 mmol/L (ref 3.5–5.1)
Sodium: 135 mmol/L (ref 135–145)

## 2023-06-13 LAB — CBC
HCT: 48.6 % (ref 39.0–52.0)
Hemoglobin: 16.3 g/dL (ref 13.0–17.0)
MCH: 30.8 pg (ref 26.0–34.0)
MCHC: 33.5 g/dL (ref 30.0–36.0)
MCV: 91.7 fL (ref 80.0–100.0)
Platelets: 232 10*3/uL (ref 150–400)
RBC: 5.3 MIL/uL (ref 4.22–5.81)
RDW: 13.2 % (ref 11.5–15.5)
WBC: 13 10*3/uL — ABNORMAL HIGH (ref 4.0–10.5)
nRBC: 0 % (ref 0.0–0.2)

## 2023-06-13 LAB — POCT ACTIVATED CLOTTING TIME
Activated Clotting Time: 250 s
Activated Clotting Time: 279 s
Activated Clotting Time: 285 s
Activated Clotting Time: 878 s

## 2023-06-13 LAB — TROPONIN I (HIGH SENSITIVITY)
Troponin I (High Sensitivity): 21 ng/L — ABNORMAL HIGH (ref <18)
Troponin I (High Sensitivity): 323 ng/L (ref <18)

## 2023-06-13 LAB — GLUCOSE, CAPILLARY: Glucose-Capillary: 97 mg/dL (ref 70–99)

## 2023-06-13 LAB — CG4 I-STAT (LACTIC ACID): Lactic Acid, Venous: 0.8 mmol/L (ref 0.5–1.9)

## 2023-06-13 LAB — HIV ANTIBODY (ROUTINE TESTING W REFLEX): HIV Screen 4th Generation wRfx: NONREACTIVE

## 2023-06-13 LAB — MRSA NEXT GEN BY PCR, NASAL: MRSA by PCR Next Gen: NOT DETECTED

## 2023-06-13 SURGERY — CORONARY/GRAFT ACUTE MI REVASCULARIZATION
Anesthesia: Moderate Sedation

## 2023-06-13 MED ORDER — HEPARIN (PORCINE) IN NACL 1000-0.9 UT/500ML-% IV SOLN
INTRAVENOUS | Status: DC | PRN
  Administered 2023-06-13 (×2): 500 mL

## 2023-06-13 MED ORDER — ONDANSETRON HCL 4 MG/2ML IJ SOLN: 4.0000 mg | Freq: Four times a day (QID) | INTRAMUSCULAR | Status: DC | PRN

## 2023-06-13 MED ORDER — TICAGRELOR 90 MG PO TABS
ORAL_TABLET | ORAL | Status: DC | PRN
  Administered 2023-06-13: 180 mg via ORAL

## 2023-06-13 MED ORDER — IOHEXOL 300 MG/ML  SOLN
INTRAMUSCULAR | Status: DC | PRN
  Administered 2023-06-13: 123 mL

## 2023-06-13 MED ORDER — SODIUM CHLORIDE 0.9 % IV BOLUS: 500.0000 mL | Freq: Once | INTRAVENOUS | Status: DC

## 2023-06-13 MED ORDER — ACETAMINOPHEN 325 MG PO TABS: 650.0000 mg | ORAL_TABLET | Freq: Four times a day (QID) | ORAL | Status: DC | PRN

## 2023-06-13 MED ORDER — VERAPAMIL HCL 2.5 MG/ML IV SOLN
INTRAVENOUS | Status: AC
  Filled 2023-06-13: qty 2

## 2023-06-13 MED ORDER — SODIUM CHLORIDE 0.9 % IV SOLN: 250.0000 mL | INTRAVENOUS | Status: DC | PRN

## 2023-06-13 MED ORDER — VERAPAMIL HCL 2.5 MG/ML IV SOLN
INTRAVENOUS | Status: DC | PRN
  Administered 2023-06-13 (×2): 2.5 mg via INTRA_ARTERIAL

## 2023-06-13 MED ORDER — NICOTINE 21 MG/24HR TD PT24: 21.0000 mg | MEDICATED_PATCH | Freq: Every day | TRANSDERMAL | Status: DC | PRN

## 2023-06-13 MED ORDER — ASPIRIN 81 MG PO TBEC: 81.0000 mg | DELAYED_RELEASE_TABLET | Freq: Every day | ORAL | Status: DC

## 2023-06-13 MED ORDER — SODIUM CHLORIDE 0.9% FLUSH
3.0000 mL | Freq: Two times a day (BID) | INTRAVENOUS | Status: DC
  Administered 2023-06-14 (×2): 3 mL via INTRAVENOUS

## 2023-06-13 MED ORDER — SODIUM CHLORIDE 0.9 % IV BOLUS
INTRAVENOUS | Status: AC | PRN
  Administered 2023-06-13: 300 mL via INTRAVENOUS

## 2023-06-13 MED ORDER — ASPIRIN 81 MG PO CHEW
324.0000 mg | CHEWABLE_TABLET | Freq: Once | ORAL | Status: AC
  Administered 2023-06-13: 324 mg via ORAL
  Filled 2023-06-13: qty 4

## 2023-06-13 MED ORDER — MIDAZOLAM HCL 2 MG/2ML IJ SOLN
INTRAMUSCULAR | Status: AC
  Filled 2023-06-13: qty 2

## 2023-06-13 MED ORDER — AMIODARONE HCL IN DEXTROSE 360-4.14 MG/200ML-% IV SOLN
30.0000 mg/h | INTRAVENOUS | Status: DC
  Administered 2023-06-13 – 2023-06-14 (×2): 30 mg/h via INTRAVENOUS
  Filled 2023-06-13 (×2): qty 200

## 2023-06-13 MED ORDER — NITROGLYCERIN 0.4 MG SL SUBL
0.4000 mg | SUBLINGUAL_TABLET | SUBLINGUAL | Status: DC | PRN
  Administered 2023-06-13 (×2): 0.4 mg via SUBLINGUAL
  Filled 2023-06-13 (×2): qty 1

## 2023-06-13 MED ORDER — CHLORHEXIDINE GLUCONATE CLOTH 2 % EX PADS
6.0000 | MEDICATED_PAD | Freq: Every day | CUTANEOUS | Status: DC
  Administered 2023-06-13 – 2023-06-14 (×2): 6 via TOPICAL

## 2023-06-13 MED ORDER — HEPARIN SODIUM (PORCINE) 5000 UNIT/ML IJ SOLN
60.0000 [IU]/kg | Freq: Once | INTRAMUSCULAR | Status: DC
Start: 2023-06-13 — End: 2023-06-13

## 2023-06-13 MED ORDER — MAGNESIUM SULFATE 50 % IJ SOLN
INTRAMUSCULAR | Status: AC
  Filled 2023-06-13: qty 2

## 2023-06-13 MED ORDER — HEPARIN (PORCINE) IN NACL 1000-0.9 UT/500ML-% IV SOLN
INTRAVENOUS | Status: AC
  Filled 2023-06-13: qty 500

## 2023-06-13 MED ORDER — TICAGRELOR 90 MG PO TABS
ORAL_TABLET | ORAL | Status: AC
  Filled 2023-06-13: qty 2

## 2023-06-13 MED ORDER — HEPARIN BOLUS VIA INFUSION
4000.0000 [IU] | Freq: Once | INTRAVENOUS | Status: AC
  Administered 2023-06-13: 4000 [IU] via INTRAVENOUS

## 2023-06-13 MED ORDER — EZETIMIBE 10 MG PO TABS
10.0000 mg | ORAL_TABLET | Freq: Every day | ORAL | Status: DC
  Administered 2023-06-14: 10 mg via ORAL
  Filled 2023-06-13: qty 1

## 2023-06-13 MED ORDER — FENTANYL CITRATE (PF) 100 MCG/2ML IJ SOLN
INTRAMUSCULAR | Status: AC
  Filled 2023-06-13: qty 2

## 2023-06-13 MED ORDER — DEXTROSE 5 % IV SOLN
INTRAVENOUS | Status: AC | PRN
  Administered 2023-06-13: 150 mg via INTRAVENOUS

## 2023-06-13 MED ORDER — HEPARIN SODIUM (PORCINE) 5000 UNIT/ML IJ SOLN: 5000.0000 [IU] | Freq: Three times a day (TID) | INTRAMUSCULAR | Status: DC

## 2023-06-13 MED ORDER — LIDOCAINE HCL 1 % IJ SOLN
INTRAMUSCULAR | Status: DC | PRN
  Administered 2023-06-13: 2 mL

## 2023-06-13 MED ORDER — HEPARIN SODIUM (PORCINE) 1000 UNIT/ML IJ SOLN
INTRAMUSCULAR | Status: AC
  Filled 2023-06-13: qty 10

## 2023-06-13 MED ORDER — MORPHINE SULFATE (PF) 4 MG/ML IV SOLN: 4.0000 mg | INTRAVENOUS | Status: AC | PRN

## 2023-06-13 MED ORDER — LABETALOL HCL 5 MG/ML IV SOLN: 10.0000 mg | INTRAVENOUS | Status: AC | PRN

## 2023-06-13 MED ORDER — ASPIRIN 81 MG PO CHEW
81.0000 mg | CHEWABLE_TABLET | Freq: Every day | ORAL | Status: DC
  Administered 2023-06-14: 81 mg via ORAL
  Filled 2023-06-13: qty 1

## 2023-06-13 MED ORDER — AMIODARONE HCL IN DEXTROSE 360-4.14 MG/200ML-% IV SOLN
60.0000 mg/h | INTRAVENOUS | Status: AC
  Administered 2023-06-13: 60 mg/h via INTRAVENOUS

## 2023-06-13 MED ORDER — ROSUVASTATIN CALCIUM 20 MG PO TABS
40.0000 mg | ORAL_TABLET | Freq: Every day | ORAL | Status: DC
  Administered 2023-06-13 – 2023-06-14 (×2): 40 mg via ORAL
  Filled 2023-06-13: qty 4
  Filled 2023-06-13 (×2): qty 2
  Filled 2023-06-13: qty 4

## 2023-06-13 MED ORDER — MAGNESIUM SULFATE 50 % IJ SOLN
INTRAMUSCULAR | Status: DC | PRN
  Administered 2023-06-13: 1 g via INTRAVENOUS

## 2023-06-13 MED ORDER — HEPARIN (PORCINE) IN NACL 1000-0.9 UT/500ML-% IV SOLN
INTRAVENOUS | Status: AC
  Filled 2023-06-13: qty 1000

## 2023-06-13 MED ORDER — HEPARIN SODIUM (PORCINE) 1000 UNIT/ML IJ SOLN
INTRAMUSCULAR | Status: DC | PRN
  Administered 2023-06-13: 2000 [IU] via INTRAVENOUS
  Administered 2023-06-13: 8000 [IU] via INTRAVENOUS
  Administered 2023-06-13: 4000 [IU] via INTRAVENOUS
  Administered 2023-06-13: 2000 [IU] via INTRAVENOUS

## 2023-06-13 MED ORDER — HYDRALAZINE HCL 20 MG/ML IJ SOLN: 10.0000 mg | INTRAMUSCULAR | Status: AC | PRN

## 2023-06-13 MED ORDER — ACETAMINOPHEN 325 MG PO TABS: 650.0000 mg | ORAL_TABLET | ORAL | Status: DC | PRN

## 2023-06-13 MED ORDER — ONDANSETRON HCL 4 MG PO TABS: 4.0000 mg | ORAL_TABLET | Freq: Four times a day (QID) | ORAL | Status: DC | PRN

## 2023-06-13 MED ORDER — MIDAZOLAM HCL 2 MG/2ML IJ SOLN
INTRAMUSCULAR | Status: DC | PRN
  Administered 2023-06-13: 1 mg via INTRAVENOUS

## 2023-06-13 MED ORDER — MORPHINE SULFATE (PF) 2 MG/ML IV SOLN
2.0000 mg | INTRAVENOUS | Status: AC | PRN
  Administered 2023-06-13 (×2): 2 mg via INTRAVENOUS
  Filled 2023-06-13 (×2): qty 1

## 2023-06-13 MED ORDER — FENTANYL CITRATE (PF) 100 MCG/2ML IJ SOLN
INTRAMUSCULAR | Status: DC | PRN
  Administered 2023-06-13: 25 ug via INTRAVENOUS

## 2023-06-13 MED ORDER — AMIODARONE HCL IN DEXTROSE 360-4.14 MG/200ML-% IV SOLN
INTRAVENOUS | Status: AC
  Filled 2023-06-13: qty 200

## 2023-06-13 MED ORDER — IPRATROPIUM-ALBUTEROL 0.5-2.5 (3) MG/3ML IN SOLN
3.0000 mL | Freq: Once | RESPIRATORY_TRACT | Status: AC
  Administered 2023-06-13: 3 mL via RESPIRATORY_TRACT
  Filled 2023-06-13: qty 3

## 2023-06-13 MED ORDER — TICAGRELOR 90 MG PO TABS
90.0000 mg | ORAL_TABLET | Freq: Two times a day (BID) | ORAL | Status: DC
  Administered 2023-06-13 – 2023-06-14 (×3): 90 mg via ORAL
  Filled 2023-06-13 (×3): qty 1

## 2023-06-13 MED ORDER — SODIUM CHLORIDE 0.9 % WEIGHT BASED INFUSION
1.0000 mL/kg/h | INTRAVENOUS | Status: AC
  Administered 2023-06-13: 1 mL/kg/h via INTRAVENOUS

## 2023-06-13 MED ORDER — SODIUM CHLORIDE 0.9% FLUSH: 3.0000 mL | INTRAVENOUS | Status: DC | PRN

## 2023-06-13 MED ORDER — ACETAMINOPHEN 650 MG RE SUPP: 650.0000 mg | Freq: Four times a day (QID) | RECTAL | Status: DC | PRN

## 2023-06-13 SURGICAL SUPPLY — 24 items
BALLN TREK RX 2.25X12 (BALLOONS) ×1 IMPLANT
BALLN ~~LOC~~ EUPHORA RX 3.25X15 (BALLOONS) ×1 IMPLANT
BALLN ~~LOC~~ TREK NEO RX 3.25X8 (BALLOONS) ×1 IMPLANT
BALLOON TREK RX 2.25X12 (BALLOONS) IMPLANT
BALLOON ~~LOC~~ EUPHORA RX 3.25X15 (BALLOONS) IMPLANT
BALLOON ~~LOC~~ TREK NEO RX 3.25X8 (BALLOONS) IMPLANT
CATH INFINITI 5FR ANG PIGTAIL (CATHETERS) IMPLANT
CATH INFINITI JR4 5F (CATHETERS) IMPLANT
CATH LAUNCHER 6FR EBU3.5 (CATHETERS) IMPLANT
DEVICE RAD TR BAND REGULAR (VASCULAR PRODUCTS) IMPLANT
DRAPE BRACHIAL (DRAPES) IMPLANT
GLIDESHEATH SLEND SS 6F .021 (SHEATH) IMPLANT
GUIDEWIRE INQWIRE 1.5J.035X260 (WIRE) IMPLANT
INQWIRE 1.5J .035X260CM (WIRE) ×1 IMPLANT
KIT ENCORE 26 ADVANTAGE (KITS) IMPLANT
PACK CARDIAC CATH (CUSTOM PROCEDURE TRAY) ×1 IMPLANT
PROTECTION STATION PRESSURIZED (MISCELLANEOUS) ×2 IMPLANT
SET ATX-X65L (MISCELLANEOUS) IMPLANT
STATION PROTECTION PRESSURIZED (MISCELLANEOUS) IMPLANT
STENT ONYX FRONTIER 3.0X18 (Permanent Stent) IMPLANT
TUBING CIL FLEX 10 FLL-RA (TUBING) IMPLANT
WIRE ASAHI PROWATER 180CM (WIRE) IMPLANT
WIRE G HI TQ BMW 190 (WIRE) IMPLANT
WIRE GUIDERIGHT .035X150 (WIRE) IMPLANT

## 2023-06-13 NOTE — Hospital Course (Signed)
 Mr. Ryan Gallagher is a 68 year old male with history of daily tobacco use, hyperlipidemia, CAD, status post PCI to large OM1, presents emergency department for chief concerns of chest pain and shortness of breath.  Vitals in the ED showed temperature of 98.1, respiration rate 20, heart rate 66, blood pressure 139/89, SpO2 100% on room air.  Serum sodium is 135, potassium 4.6, chloride 101, bicarb 26, BUN of 16, serum creatinine 1.01, EGFR greater than 60, nonfasting blood glucose 104, WBC 13, hemoglobin 16.3, platelets of 232.  High sensitive troponin was 21 and on repeat was 323.  ED treatment: Aspirin 324 mg p.o. one-time dose, heparin bolus, DuoNebs one-time treatment.

## 2023-06-13 NOTE — Consult Note (Signed)
 [facility]  CARDIOLOGY CONSULT NOTE  Patient ID: Ryan Gallagher MRN: [id number] DOB/AGE: 70/12/1955 70 y.o.  Admit date: 06/13/2023 Referring Physician Roxan Hockey Primary Physician Sallyanne Kuster, NP Primary Cardiologist Kirke Corin Reason for Consultation lateral STEMI  HPI: 68 year old gentleman referred for lateral STEMI.  Patient has known coronary artery disease with 70-80% stenosis OM1 by cardiac catheterization 10/02/2022.  RFR was 0.96 PCI was deferred.  He presents today with a 2-day history of intermittent chest and neck discomfort.  Chest pain progressed today and presented to [facility] ED where initial ECG was unremarkable.  Patient complained of worsening chest pain and repeat ECG revealed ST elevations in leads I, aVL, V5 and V6, and II and aVF, consistent with lateral wall ST elevation myocardial infarction.  Patient was brought to the cardiac catheterization laboratory which revealed 99% subtotaled large OM1.  The patient went primary PCI receiving 0.0 x 18 mm Onyx Frontier DES with excellent angiographic result.  Left ventriculography revealed preserved left-ventricular function with estimated LV ejection fraction 50-55%.  Review of systems complete and found to be negative unless listed above     Past Medical History:  Diagnosis Date   Arthritis    COPD, mild (HCC)    Diverticulitis    Enlarged prostate    GERD (gastroesophageal reflux disease)    OCC   Headache    MIGRAINES   History of hiatal hernia     Past Surgical History:  Procedure Laterality Date   CATARACT EXTRACTION W/PHACO Left 11/29/2021   Procedure: CATARACT EXTRACTION PHACO AND INTRAOCULAR LENS PLACEMENT (IOC) LEFT;  Surgeon: Galen Manila, MD;  Location: [facility];  Service: Ophthalmology;  Laterality: Left;  6.28 1:07.0   CORONARY PRESSURE/FFR STUDY N/A 10/02/2022   Procedure: CORONARY PRESSURE/FFR STUDY;  Surgeon: Iran Ouch, MD;  Location: [facility] INVASIVE CV LAB;  Service: Cardiovascular;   Laterality: N/A;   HOLEP-LASER ENUCLEATION OF THE PROSTATE WITH MORCELLATION N/A 04/19/2018   Procedure: HOLEP-LASER ENUCLEATION OF THE PROSTATE WITH MORCELLATION;  Surgeon: Sondra Come, MD;  Location: [facility] ORS;  Service: Urology;  Laterality: N/A;   LEFT HEART CATH AND CORONARY ANGIOGRAPHY Left 10/02/2022   Procedure: LEFT HEART CATH AND CORONARY ANGIOGRAPHY;  Surgeon: Iran Ouch, MD;  Location: [facility] INVASIVE CV LAB;  Service: Cardiovascular;  Laterality: Left;   NO PAST SURGERIES      Medications Prior to Admission  Medication Sig Dispense Refill Last Dose/Taking   albuterol (VENTOLIN HFA) 108 (90 Base) MCG/ACT inhaler Inhale 2 puffs into the lungs every 6 (six) hours as needed for wheezing or shortness of breath. 8 g 2    aspirin EC 81 MG tablet Take 1 tablet (81 mg total) by mouth daily. Swallow whole. 90 tablet 3    aspirin-acetaminophen-caffeine (EXCEDRIN MIGRAINE) 250-250-65 MG per tablet Take 1 tablet by mouth every 6 (six) hours as needed for headache.      calcium carbonate (TUMS - DOSED IN MG ELEMENTAL CALCIUM) 500 MG chewable tablet Chew 1 tablet by mouth as needed for indigestion or heartburn.      diphenhydrAMINE HCl (BENADRYL ALLERGY PO) Take by mouth.      DIPHENHYDRAMINE HCL, TOPICAL, (BENADRYL ITCH STOPPING) 2 % GEL Apply topically.      ezetimibe (ZETIA) 10 MG tablet Take 1 tablet (10 mg total) by mouth daily. 90 tablet 3    Magnesium Gluconate 500 (27 Mg) MG TABS Take 500 mg by mouth daily at 6 (six) AM.      meloxicam (MOBIC) 7.5 MG  tablet Take 1 tablet (7.5 mg total) by mouth daily. 30 tablet 3    methocarbamol (ROBAXIN) 500 MG tablet Take 1 tablet (500 mg total) by mouth every 8 (eight) hours as needed for muscle spasms. 90 tablet 0    Multiple Vitamins-Minerals (MULTIVITAMIN WITH MINERALS) tablet Take 1 tablet by mouth daily.      nitroGLYCERIN (NITROSTAT) 0.4 MG SL tablet Place 1 tablet (0.4 mg total) under the tongue every 5 (five) minutes as needed for chest  pain. 25 tablet 0    rosuvastatin (CRESTOR) 40 MG tablet Take 1 tablet (40 mg total) by mouth daily. 90 tablet 3    Varenicline Tartrate, Starter, 0.5 MG X 11 & 1 MG X 42 TBPK Take 0.5 mg by mouth once daily on days 1-3, then take 0.5 mg twice daily on days 4-7, then increase to 1 mg twice daily 53 each 0    vitamin B-12 (CYANOCOBALAMIN) 100 MCG tablet Take 100 mcg by mouth daily.      Social History   Socioeconomic History   Marital status: Single    Spouse name: Not on file   Number of children: Not on file   Years of education: Not on file   Highest education level: Not on file  Occupational History   Not on file  Tobacco Use   Smoking status: Every Day    Current packs/day: 0.50    Average packs/day: 0.5 packs/day for 50.0 years (25.0 ttl pk-yrs)    Types: Cigarettes   Smokeless tobacco: Never  Vaping Use   Vaping status: Never Used  Substance and Sexual Activity   Alcohol use: Yes    Comment: occ   Drug use: Never   Sexual activity: Yes    Birth control/protection: None  Other Topics Concern   Not on file  Social History Narrative   Not on file   Social Drivers of Health   Financial Resource Strain: Not on file  Food Insecurity: Not on file  Transportation Needs: Not on file  Physical Activity: Not on file  Stress: Not on file  Social Connections: Not on file  Intimate Partner Violence: Not on file    Family History  Problem Relation Age of Onset   Alzheimer's disease Mother    Heart disease Father    Alzheimer's disease Father    Diabetes Paternal Uncle    Prostate cancer Neg Hx    Bladder Cancer Neg Hx    Kidney cancer Neg Hx       Review of systems complete and found to be negative unless listed above      PHYSICAL EXAM  General: Well developed, well nourished, in no acute distress HEENT:  Normocephalic and atramatic Neck:  No JVD.  Lungs: Clear bilaterally to auscultation and percussion. Heart: HRRR . Normal S1 and S2 without gallops or  murmurs.  Abdomen: Bowel sounds are positive, abdomen soft and non-tender  Msk:  Back normal, normal gait. Normal strength and tone for age. Extremities: No clubbing, cyanosis or edema.   Neuro: Alert and oriented X 3. Psych:  Good affect, responds appropriately  Labs:   Lab Results  Component Value Date   WBC 13.0 (H) 06/13/2023   HGB 16.3 06/13/2023   HCT 48.6 06/13/2023   MCV 91.7 06/13/2023   PLT 232 06/13/2023    Recent Labs  Lab 06/13/23 1116  NA 135  K 4.6  CL 101  CO2 26  BUN 16  CREATININE 1.01  CALCIUM 9.4  GLUCOSE 104*   Lab Results  Component Value Date   TROPONINI <0.03 03/23/2018    Lab Results  Component Value Date   CHOL 154 01/11/2023   CHOL 225 (H) 08/18/2022   Lab Results  Component Value Date   HDL 37 (L) 01/11/2023   HDL 33 (L) 08/18/2022   Lab Results  Component Value Date   LDLCALC 94 01/11/2023   LDLCALC 140 (H) 08/18/2022   Lab Results  Component Value Date   TRIG 131 01/11/2023   TRIG 262 (H) 08/18/2022   Lab Results  Component Value Date   CHOLHDL 4.2 01/11/2023   CHOLHDL 6.8 08/18/2022   No results found for: "LDLDIRECT"    Radiology: CARDIAC CATHETERIZATION Result Date: 06/13/2023   Mid RCA lesion is 20% stenosed.   Prox RCA lesion is 15% stenosed.   Mid LAD lesion is 40% stenosed.   1st Mrg lesion is 99% stenosed.   A stent was successfully placed.   A drug-eluting stent was successfully placed using a STENT ONYX FRONTIER 3.0X18.   Post intervention, there is a 0% residual stenosis.   The left ventricular systolic function is normal.   LV end diastolic pressure is mildly elevated.   The left ventricular ejection fraction is 50-55% by visual estimate. 1.  Lateral wall STEMI 2.  99% stenosis large OM1 3.  Normal left ventricular function 4.  Successful primary PCI with 3.0 x 18 mm Onyx Frontier DES OM1 Recommendations 1.  Dual antiplatelet therapy uninterrupted x 1 year 2.  2D echocardiogram 3.  Resume rosuvastatin 40 mg daily  4.  Defer starting beta-blocker at this time due to baseline bradycardia   DG Chest 2 View Result Date: 06/13/2023 CLINICAL DATA:  Chest pain. EXAM: CHEST - 2 VIEW COMPARISON:  03/09/2021. FINDINGS: Bilateral lung fields are clear. Bilateral costophrenic angles are clear. Normal cardio-mediastinal silhouette. No acute osseous abnormalities. The soft tissues are within normal limits. IMPRESSION: No active cardiopulmonary disease. Electronically Signed   By: Jules Schick M.D.   On: 06/13/2023 14:13    EKG: Sinus bradycardia, ST elevation in leads I, aVL, V5, V6, II, and aVF  ASSESSMENT AND PLAN:   1.  Lateral STEMI, subtotal 99% stenosis large OM1, status post successful primary PCI with 3.0 x 18 mm Onyx frontier DES 2.  Preserved left ventricular function, with estimate LVEF-55% 3.  Nonsustained ventricular tachycardia during initial cardiac catheterization, treated with amiodarone bolus and infusion, without recurrence 4.  History of tobacco abuse  Recommendations  1.  Continue dual antiplatelet therapy (aspirin and Brilinta) uninterrupted x 1 year 2.  Resume home rosuvastatin 40 mg daily, consider up titration 3.  Defer starting beta-blocker at this time due to bradycardia 4.  2D echocardiogram 5.  DC amiodarone infusion after completion  Signed: Marcina Millard MD,PhD, [facility] 06/13/2023, 4:27 PM

## 2023-06-13 NOTE — ED Provider Notes (Signed)
 [facility] Provider Note    Event Date/Time   First MD Initiated Contact with Patient 06/13/23 1342     (approximate)   History   Chest Pain   HPI  Ryan Gallagher is a 68 y.o. male who presents to the ER for evaluation of intermittent chest pain radiating to his neck and shoulders.  Denies any diaphoresis.  Did not take nitroglycerin.  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  Does have a history of nonobstructive CAD followed by Dr. [place] cardiology.     Physical Exam   Triage Vital Signs: ED Triage Vitals  Encounter Vitals Group     BP 06/13/23 1115 139/89     Systolic BP Percentile --      Diastolic BP Percentile --      Pulse Rate 06/13/23 1115 66     Resp 06/13/23 1115 20     Temp 06/13/23 1115 98.1 F (36.7 C)     Temp Source 06/13/23 1115 Oral     SpO2 06/13/23 1115 100 %     Weight --      Height --      Head Circumference --      Peak Flow --      Pain Score 06/13/23 1113 6     Pain Loc --      Pain Education --      Exclude from Growth Chart --     Most recent vital signs: Vitals:   06/13/23 1115 06/13/23 1400  BP: 139/89 118/78  Pulse: 66 (!) 56  Resp: 20 (!) 25  Temp: 98.1 F (36.7 C)   SpO2: 100% 97%     Constitutional: Alert  Eyes: Conjunctivae are normal.  Head: Atraumatic. Nose: No congestion/rhinnorhea. Mouth/Throat: Mucous membranes are moist.   Neck: Painless ROM.  Cardiovascular:   Good peripheral circulation. Respiratory: Normal respiratory effort.  No retractions.  Gastrointestinal: Soft and nontender.  Musculoskeletal:  no deformity Neurologic:  MAE spontaneously. No gross focal neurologic deficits are appreciated.  Skin:  Skin is warm, dry and intact. No rash noted. Psychiatric: Mood and affect are normal. Speech and behavior are normal.    ED Results / Procedures / Treatments   Labs (all labs ordered are listed, but only abnormal results are displayed) Labs Reviewed   BASIC METABOLIC PANEL - Abnormal; Notable for the following components:      Result Value   Glucose, Bld 104 (*)    All other components within normal limits  CBC - Abnormal; Notable for the following components:   WBC 13.0 (*)    All other components within normal limits  TROPONIN I (HIGH SENSITIVITY) - Abnormal; Notable for the following components:   Troponin I (High Sensitivity) 21 (*)    All other components within normal limits  TROPONIN I (HIGH SENSITIVITY) - Abnormal; Notable for the following components:   Troponin I (High Sensitivity) 323 (*)    All other components within normal limits     EKG  ED ECG REPORT I, Willy Eddy, the attending physician, personally viewed and interpreted this ECG.   Date: 06/13/2023  EKG Time: 11:14  Rate: 60  Rhythm: sinus  Axis: normal  Intervals: normal  ST&T Change: nonspecific st abn, no stemi  ED ECG REPORT I, Willy Eddy, the attending physician, personally viewed and interpreted this ECG.   Date: 06/13/2023  EKG Time: 14:21  Rate: 60  Rhythm: sinus  Axis: normal  Intervals:normal  ST&T Change: anterolateral STEMI    RADIOLOGY Please see ED Course for my review and interpretation.  I personally reviewed all radiographic images ordered to evaluate for the above acute complaints and reviewed radiology reports and findings.  These findings were personally discussed with the patient.  Please see medical record for radiology report.    PROCEDURES:  Critical Care performed: Yes, see critical care procedure note(s)  Procedures   MEDICATIONS ORDERED IN ED: Medications  nitroGLYCERIN (NITROSTAT) SL tablet 0.4 mg (0.4 mg Sublingual Given 06/13/23 1423)  sodium chloride 0.9 % bolus 500 mL (has no administration in time range)  heparin injection 60 Units/kg (has no administration in time range)  aspirin chewable tablet 324 mg (324 mg Oral Given 06/13/23 1400)  ipratropium-albuterol (DUONEB) 0.5-2.5 (3) MG/3ML  nebulizer solution 3 mL (3 mLs Nebulization Given 06/13/23 1400)  heparin bolus via infusion 4,000 Units (4,000 Units Intravenous Bolus from Bag 06/13/23 1432)     IMPRESSION / MDM / ASSESSMENT AND PLAN / ED COURSE  I reviewed the triage vital signs and the nursing notes.                              Differential diagnosis includes, but is not limited to, ACS, pericarditis, esophagitis, boerhaaves, pe, dissection, pna, bronchitis, costochondritis   Patient presenting to the ER for evaluation of symptoms as described above.  Based on symptoms, risk factors and considered above differential, this presenting complaint could reflect a potentially life-threatening illness therefore the patient will be placed on continuous pulse oximetry and telemetry for monitoring.  Laboratory evaluation will be sent to evaluate for the above complaints.  Patient nontoxic-appearing presenting with some atypical symptoms of chest pain.  His abdominal exam is soft and benign.  Lower suspicion for dissection.  Lower suspicion for PE.  Possible pneumonia bronchitis.  Initial EKG is nonischemic.  Initial troponin troponin is mildly elevated which is concerning given age and risk factors will observe for repeat and anticipate consultation to cardiology.   Clinical Course as of 06/13/23 1435  Wed Jun 13, 2023  1424 Went to recheck on patient just a few moments ago.  He appeared more uncomfortable complaining of left arm pain.  Telemetry monitor appeared to have dynamic changes.  Repeat EKG consistent with STEMI.  Code STEMI called. [PR]  1435 Cardiology evaluated patient at bedside.  Patient has been given bolus of heparin.  Already received aspirin.  Plan to take to Cath Lab for PCI. [PR]    Clinical Course User Index [PR] Willy Eddy, MD     FINAL CLINICAL IMPRESSION(S) / ED DIAGNOSES   Final diagnoses:  ST elevation myocardial infarction (STEMI), unspecified artery (HCC)     Rx / DC Orders   ED  Discharge Orders     None        Note:  This document was prepared using Dragon voice recognition software and may include unintentional dictation errors.    Willy Eddy, MD 06/13/23 1435

## 2023-06-13 NOTE — Assessment & Plan Note (Signed)
 Patient is post PCI to large OM1 by STEMI provider Patient started on Brilinta 90 mg p.o. twice daily, rosuvastatin 40 mg daily, and ezetimibe 10 mg daily Continue with aspirin 81 mg daily No beta-blockade scheduled on admission due to low heart rate Per cardiology, ACE inhibitor can be initiated tomorrow [facility] cardiology will follow the next day Continue admission to stepdown, inpatient

## 2023-06-13 NOTE — Progress Notes (Signed)
   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 Wife was at bedside and chaplain walked her to Cath Lab waiting room. Chaplain noticed wife was a bit teary eyed as she stated she is ok until she is alone. Chaplain will refer to the on-call chaplain

## 2023-06-13 NOTE — H&P (Addendum)
 History and Physical   BRINLEY TREANOR HYW:[id number] DOB: 01-Oct-1955 DOA: 06/13/2023  PCP: Sallyanne Kuster, NP  Outpatient Specialists: Dr. Kirke Corin Patient coming from: Home  I have personally briefly reviewed patient's old medical records in [facility] EMR.  Chief Concern: Chest pain, shortness of breath  HPI: Mr. Jaeger Trueheart is a 68 year old male with history of daily tobacco use, hyperlipidemia, CAD, status post PCI to large OM1, presents emergency department for chief concerns of chest pain and shortness of breath.  Vitals in the ED showed temperature of 98.1, respiration rate 20, heart rate 66, blood pressure 139/89, SpO2 100% on room air.  Serum sodium is 135, potassium 4.6, chloride 101, bicarb 26, BUN of 16, serum creatinine 1.01, EGFR greater than 60, nonfasting blood glucose 104, WBC 13, hemoglobin 16.3, platelets of 232.  High sensitive troponin was 21 and on repeat was 323.  ED treatment: Aspirin 324 mg p.o. one-time dose, heparin bolus, DuoNebs one-time treatment. ------------------------------------ At bedside, patient was able to tell me his first and last name, age, location, current calendar year.  He reports centralized chest pressure with feelings like his throat was closing in with associated shortness of breath.  He further reports that he has a history of COPD. He reports he feels much better at bedside currently.   He denies trauma to his person and states he is never felt this way before.  Social history: He lives at home with his wife of 5 years.  He reports that he smokes daily, about 0.5-1 pack/day.  He infrequently drinks EtOH, likely has 2 glasses of wine per week.  He denies recreational drug use and he reports that he is retired.  ROS: Constitutional: no weight change, no fever ENT/Mouth: no sore throat, no rhinorrhea Eyes: no eye pain, no vision changes Cardiovascular: + chest pain, + dyspnea,  no edema, no palpitations Respiratory: no cough,  no sputum, no wheezing Gastrointestinal: no nausea, no vomiting, no diarrhea, no constipation Genitourinary: no urinary incontinence, no dysuria, no hematuria Musculoskeletal: no arthralgias, no myalgias Skin: no skin lesions, no pruritus, Neuro: + weakness, no loss of consciousness, no syncope Psych: no anxiety, no depression, + decrease appetite Heme/Lymph: no bruising, no bleeding  Assessment/Plan  Principal Problem:   STEMI (ST elevation myocardial infarction) (HCC) Active Problems:   BPH with obstruction/lower urinary tract symptoms   Nicotine dependence   Coronary artery disease involving native coronary artery of native heart with unstable angina pectoris (HCC)   Acute ST elevation myocardial infarction (STEMI) of lateral wall (HCC)   Hyperlipidemia   Assessment and Plan:  * STEMI (ST elevation myocardial infarction) (HCC) Patient is post PCI to large OM1 by STEMI provider Patient started on Brilinta 90 mg p.o. twice daily, rosuvastatin 40 mg daily, and ezetimibe 10 mg daily Continue with aspirin 81 mg daily No beta-blockade scheduled on admission due to low heart rate Per cardiology, ACE inhibitor can be initiated tomorrow [facility] cardiology will follow the next day Continue admission to stepdown, inpatient  Hyperlipidemia Rosuvastatin 40 mg daily resumed  Coronary artery disease involving native coronary artery of native heart with unstable angina pectoris (HCC) Continue with aspirin 81 mg daily, Brilinta 90 mg p.o. twice daily  Nicotine dependence As needed nicotine patch ordered > 4 minutes spent on counseling patient on tobacco cessation including increased risk of future cardiac blockage and stroke and cancer Patient endorses understanding and compliance  Chart reviewed.   DVT prophylaxis: Heparin Code Status: Full code Diet: Heart healthy Family  Communication: No, spouse is aware that patient is being admitted to the hospital Disposition Plan: Pending  clinical course Consults called: Cardiology Admission status: Inpatient, stepdown  Past Medical History:  Diagnosis Date   Arthritis    COPD, mild (HCC)    Diverticulitis    Enlarged prostate    GERD (gastroesophageal reflux disease)    OCC   Headache    MIGRAINES   History of hiatal hernia    Past Surgical History:  Procedure Laterality Date   CATARACT EXTRACTION W/PHACO Left 11/29/2021   Procedure: CATARACT EXTRACTION PHACO AND INTRAOCULAR LENS PLACEMENT (IOC) LEFT;  Surgeon: Galen Manila, MD;  Location: [facility];  Service: Ophthalmology;  Laterality: Left;  6.28 1:07.0   CORONARY PRESSURE/FFR STUDY N/A 10/02/2022   Procedure: CORONARY PRESSURE/FFR STUDY;  Surgeon: Iran Ouch, MD;  Location: [facility] INVASIVE CV LAB;  Service: Cardiovascular;  Laterality: N/A;   HOLEP-LASER ENUCLEATION OF THE PROSTATE WITH MORCELLATION N/A 04/19/2018   Procedure: HOLEP-LASER ENUCLEATION OF THE PROSTATE WITH MORCELLATION;  Surgeon: Sondra Come, MD;  Location: [facility] ORS;  Service: Urology;  Laterality: N/A;   LEFT HEART CATH AND CORONARY ANGIOGRAPHY Left 10/02/2022   Procedure: LEFT HEART CATH AND CORONARY ANGIOGRAPHY;  Surgeon: Iran Ouch, MD;  Location: [facility] INVASIVE CV LAB;  Service: Cardiovascular;  Laterality: Left;   NO PAST SURGERIES     Social History:  reports that he has been smoking cigarettes. He has a 25 pack-year smoking history. He has never used smokeless tobacco. He reports current alcohol use. He reports that he does not use drugs.  No Known Allergies Family History  Problem Relation Age of Onset   Alzheimer's disease Mother    Heart disease Father    Alzheimer's disease Father    Diabetes Paternal Uncle    Prostate cancer Neg Hx    Bladder Cancer Neg Hx    Kidney cancer Neg Hx    Family history: Family history reviewed and not pertinent.  Prior to Admission medications   Medication Sig Start Date End Date Taking? Authorizing Provider  albuterol  (VENTOLIN HFA) 108 (90 Base) MCG/ACT inhaler Inhale 2 puffs into the lungs every 6 (six) hours as needed for wheezing or shortness of breath. 04/03/23   Sallyanne Kuster, NP  aspirin EC 81 MG tablet Take 1 tablet (81 mg total) by mouth daily. Swallow whole. 09/29/22   Furth, Cadence H, PA-C  aspirin-acetaminophen-caffeine (EXCEDRIN MIGRAINE) [phone] MG per tablet Take 1 tablet by mouth every 6 (six) hours as needed for headache.    [provider]  calcium carbonate (TUMS - DOSED IN MG ELEMENTAL CALCIUM) 500 MG chewable tablet Chew 1 tablet by mouth as needed for indigestion or heartburn.    [provider]  diphenhydrAMINE HCl (BENADRYL ALLERGY PO) Take by mouth.    [provider]  DIPHENHYDRAMINE HCL, TOPICAL, (BENADRYL ITCH STOPPING) 2 % GEL Apply topically.    [provider]  ezetimibe (ZETIA) 10 MG tablet Take 1 tablet (10 mg total) by mouth daily. 01/19/23 04/19/23  Iran Ouch, MD  Magnesium Gluconate 500 (27 Mg) MG TABS Take 500 mg by mouth daily at 6 (six) AM.    [provider]  meloxicam (MOBIC) 7.5 MG tablet Take 1 tablet (7.5 mg total) by mouth daily. 07/03/22   Sallyanne Kuster, NP  methocarbamol (ROBAXIN) 500 MG tablet Take 1 tablet (500 mg total) by mouth every 8 (eight) hours as needed for muscle spasms. 08/09/22   Sallyanne Kuster,  NP  Multiple Vitamins-Minerals (MULTIVITAMIN WITH MINERALS) tablet Take 1 tablet by mouth daily.    [provider]  nitroGLYCERIN (NITROSTAT) 0.4 MG SL tablet Place 1 tablet (0.4 mg total) under the tongue every 5 (five) minutes as needed for chest pain. 09/29/22 01/11/23  Furth, Cadence H, PA-C  rosuvastatin (CRESTOR) 40 MG tablet Take 1 tablet (40 mg total) by mouth daily. 01/19/23 04/19/23  Iran Ouch, MD  Varenicline Tartrate, Starter, 0.5 MG X 11 & 1 MG X 42 TBPK Take 0.5 mg by mouth once daily on days 1-3, then take 0.5 mg twice daily on days 4-7, then increase to 1 mg twice daily 05/09/23    Sallyanne Kuster, NP  vitamin B-12 (CYANOCOBALAMIN) 100 MCG tablet Take 100 mcg by mouth daily.    [provider]   Physical Exam: Vitals:   06/13/23 1609 06/13/23 1614 06/13/23 1700 06/13/23 1800  BP: 104/71 102/77 103/66   Pulse: (!) 52 (!) 56 (!) 56 (!) 54  Resp: 15 19 (!) 21 12  Temp:      TempSrc:      SpO2:   98% 98%  Weight:   89.2 kg   Height:   6\' 5"  (1.956 m)    Constitutional: appears younger than chronological age, NAD, calm Eyes: PERRL, lids and conjunctivae normal ENMT: Mucous membranes are moist. Posterior pharynx clear of any exudate or lesions. Age-appropriate dentition. Hearing appropriate Neck: normal, supple, no masses, no thyromegaly Respiratory: clear to auscultation bilaterally, no wheezing, no crackles. Normal respiratory effort. No accessory muscle use.  Cardiovascular: Regular rate and rhythm, no murmurs / rubs / gallops. No extremity edema. 2+ pedal pulses. No carotid bruits.  Abdomen: no tenderness, no masses palpated, no hepatosplenomegaly. Bowel sounds positive.  Musculoskeletal: no clubbing / cyanosis. No joint deformity upper and lower extremities. Good ROM, no contractures, no atrophy. Normal muscle tone.  Skin: no rashes, lesions, ulcers. No induration Neurologic: Sensation intact. Strength 5/5 in all 4.  Psychiatric: Normal judgment and insight. Alert and oriented x 3. Normal mood.   EKG: independently reviewed, showing initial sinus rhythm with rate of 62, QTc 401.  Repeat EKG showed lateral lead ST elevation in leads I and II ST elevation.  Code STEMI was called.  Chest x-ray on Admission: I personally reviewed and I agree with radiologist reading as below.  CARDIAC CATHETERIZATION Result Date: 06/13/2023   Mid RCA lesion is 20% stenosed.   Prox RCA lesion is 15% stenosed.   Mid LAD lesion is 40% stenosed.   1st Mrg lesion is 99% stenosed.   A stent was successfully placed.   A drug-eluting stent was successfully placed using a STENT  ONYX FRONTIER 3.0X18.   Post intervention, there is a 0% residual stenosis.   The left ventricular systolic function is normal.   LV end diastolic pressure is mildly elevated.   The left ventricular ejection fraction is 50-55% by visual estimate. 1.  Lateral wall STEMI 2.  99% stenosis large OM1 3.  Normal left ventricular function 4.  Successful primary PCI with 3.0 x 18 mm Onyx Frontier DES OM1 Recommendations 1.  Dual antiplatelet therapy uninterrupted x 1 year 2.  2D echocardiogram 3.  Resume rosuvastatin 40 mg daily 4.  Defer starting beta-blocker at this time due to baseline bradycardia   DG Chest 2 View Result Date: 06/13/2023 CLINICAL DATA:  Chest pain. EXAM: CHEST - 2 VIEW COMPARISON:  03/09/2021. FINDINGS: Bilateral lung fields are clear. Bilateral costophrenic angles are clear.  Normal cardio-mediastinal silhouette. No acute osseous abnormalities. The soft tissues are within normal limits. IMPRESSION: No active cardiopulmonary disease. Electronically Signed   By: Jules Schick M.D.   On: 06/13/2023 14:13   Labs on Admission: I have personally reviewed following labs  CBC: Recent Labs  Lab 06/13/23 1116  WBC 13.0*  HGB 16.3  HCT 48.6  MCV 91.7  PLT 232   Basic Metabolic Panel: Recent Labs  Lab 06/13/23 1116  NA 135  K 4.6  CL 101  CO2 26  GLUCOSE 104*  BUN 16  CREATININE 1.01  CALCIUM 9.4   GFR: Estimated Creatinine Clearance: 85.8 mL/min (by C-G formula based on SCr of 1.01 mg/dL).  CBG: Recent Labs  Lab 06/13/23 1633  GLUCAP 97   Urine analysis:    Component Value Date/Time   COLORURINE YELLOW (A) 03/23/2018 2348   APPEARANCEUR Clear 04/12/2018 0855   LABSPEC 1.009 03/23/2018 2348   PHURINE 6.0 03/23/2018 2348   GLUCOSEU Negative 04/12/2018 0855   HGBUR MODERATE (A) 03/23/2018 2348   BILIRUBINUR Negative 04/12/2018 0855   KETONESUR NEGATIVE 03/23/2018 2348   PROTEINUR Negative 04/12/2018 0855   PROTEINUR NEGATIVE 03/23/2018 2348   NITRITE Negative  04/12/2018 0855   NITRITE NEGATIVE 03/23/2018 2348   LEUKOCYTESUR Negative 04/12/2018 0855   This document was prepared using Dragon Voice Recognition software and may include unintentional dictation errors.  Dr. Sedalia Muta [facility]  If 7PM-7AM, please contact overnight-coverage provider If 7AM-7PM, please contact day attending provider [email or web address]  06/13/2023, 6:04 PM

## 2023-06-13 NOTE — ED Notes (Signed)
 First nurse note: Pt here via [facility] from home with c/o CP that started a few hours ago and pain has been intermittent for 2 weeks. Rest relieves CP. Pt has HX of CAD. EMS reported tingling in L arm    500mg  of Bayer ASA PTA to EMS arrival.    136/80 HR: 62 97% RA  18G R AC.

## 2023-06-13 NOTE — Assessment & Plan Note (Signed)
 Continue with aspirin 81 mg daily, Brilinta 90 mg p.o. twice daily

## 2023-06-13 NOTE — ED Triage Notes (Signed)
 Pt to ED via [facility] from home. Pt reports felt like his throat was closing and centralized CP that started around 8am. Pt reports pain would ease off and come back. Pt also reports coughing spells and some SOB. Pt reports hx heart blockage. Pt denies blood thinner. Hx COPD

## 2023-06-13 NOTE — ED Notes (Signed)
 Paraschos, MD, arrived at bedside

## 2023-06-13 NOTE — ED Notes (Signed)
 CODE STEMI  CALLED  PER DR  Roxan Hockey  MD

## 2023-06-13 NOTE — Assessment & Plan Note (Addendum)
 As needed nicotine patch ordered > 4 minutes spent on counseling patient on tobacco cessation including increased risk of future cardiac blockage and stroke and cancer Patient endorses understanding and compliance

## 2023-06-13 NOTE — Assessment & Plan Note (Signed)
-   Rosuvastatin 40 mg daily resumed 

## 2023-06-13 NOTE — ED Notes (Signed)
 Patient transported to cath lab

## 2023-06-13 NOTE — Plan of Care (Signed)
  Problem: Clinical Measurements: Goal: Diagnostic test results will improve Outcome: Progressing Goal: Respiratory complications will improve Outcome: Progressing Goal: Cardiovascular complication will be avoided Outcome: Progressing   Problem: Education: Goal: Understanding of CV disease, CV risk reduction, and recovery process will improve Outcome: Progressing   Problem: Cardiovascular: Goal: Vascular access site(s) Level 0-1 will be maintained Outcome: Progressing Note: Assess vascular access site(s) for complications and intervene as needed

## 2023-06-14 ENCOUNTER — Other Ambulatory Visit: Payer: Self-pay | Admitting: *Deleted

## 2023-06-14 ENCOUNTER — Inpatient Hospital Stay (HOSPITAL_COMMUNITY)
Admit: 2023-06-14 | Discharge: 2023-06-14 | Disposition: A | Discharge status: Home / Self Care | Attending: Internal Medicine | Admitting: Internal Medicine

## 2023-06-14 ENCOUNTER — Other Ambulatory Visit: Payer: Self-pay

## 2023-06-14 ENCOUNTER — Encounter: Payer: Self-pay | Admitting: Cardiology

## 2023-06-14 DIAGNOSIS — I25110 Atherosclerotic heart disease of native coronary artery with unstable angina pectoris: Secondary | ICD-10-CM | POA: Diagnosis not present

## 2023-06-14 DIAGNOSIS — F17219 Nicotine dependence, cigarettes, with unspecified nicotine-induced disorders: Secondary | ICD-10-CM

## 2023-06-14 DIAGNOSIS — I2129 ST elevation (STEMI) myocardial infarction involving other sites: Secondary | ICD-10-CM | POA: Diagnosis not present

## 2023-06-14 DIAGNOSIS — I213 ST elevation (STEMI) myocardial infarction of unspecified site: Secondary | ICD-10-CM | POA: Diagnosis not present

## 2023-06-14 DIAGNOSIS — E782 Mixed hyperlipidemia: Secondary | ICD-10-CM

## 2023-06-14 LAB — CBC
HCT: 42.8 % (ref 39.0–52.0)
Hemoglobin: 14.7 g/dL (ref 13.0–17.0)
MCH: 31.1 pg (ref 26.0–34.0)
MCHC: 34.3 g/dL (ref 30.0–36.0)
MCV: 90.5 fL (ref 80.0–100.0)
Platelets: 205 10*3/uL (ref 150–400)
RBC: 4.73 MIL/uL (ref 4.22–5.81)
RDW: 13.2 % (ref 11.5–15.5)
WBC: 9 10*3/uL (ref 4.0–10.5)
nRBC: 0 % (ref 0.0–0.2)

## 2023-06-14 LAB — BASIC METABOLIC PANEL
Anion gap: 11 (ref 5–15)
BUN: 16 mg/dL (ref 8–23)
CO2: 22 mmol/L (ref 22–32)
Calcium: 8.7 mg/dL — ABNORMAL LOW (ref 8.9–10.3)
Chloride: 105 mmol/L (ref 98–111)
Creatinine, Ser: 0.93 mg/dL (ref 0.61–1.24)
GFR, Estimated: >60 mL/min (ref >60)
Glucose, Bld: 97 mg/dL (ref 70–99)
Potassium: 4 mmol/L (ref 3.5–5.1)
Sodium: 138 mmol/L (ref 135–145)

## 2023-06-14 LAB — ECHOCARDIOGRAM COMPLETE
AR max vel: 4.23 cm2
AV Area VTI: 4.19 cm2
AV Area mean vel: 4.12 cm2
AV Mean grad: 3 mmHg
AV Peak grad: 5.2 mmHg
Ao pk vel: 1.14 m/s
Area-P 1/2: 3.17 cm2
Height: 77 in
MV VTI: 2.75 cm2
S' Lateral: 3.4 cm
Weight: 3146.41 [oz_av]

## 2023-06-14 MED ORDER — TICAGRELOR 90 MG PO TABS
90.0000 mg | ORAL_TABLET | Freq: Two times a day (BID) | ORAL | 2 refills | Status: DC
  Filled 2023-06-14: qty 60, 30d supply, fill #0

## 2023-06-14 MED ORDER — PRASUGREL HCL 10 MG PO TABS
ORAL_TABLET | ORAL | 0 refills | Status: DC
  Filled 2023-06-14: qty 30, 25d supply, fill #0

## 2023-06-14 MED ORDER — NICOTINE 21 MG/24HR TD PT24
21.0000 mg | MEDICATED_PATCH | Freq: Every day | TRANSDERMAL | 0 refills | Status: DC | PRN
  Filled 2023-06-14: qty 28, 28d supply, fill #0

## 2023-06-14 NOTE — TOC CM/SW Note (Signed)
 Transition of Care [facility]) - Inpatient Brief Assessment   Patient Details  Name: Ryan Gallagher MRN: [id number] Date of Birth: 09-12-1955  [facility]) CM/SW Contact:    Margarito Liner, LCSW Phone Number: 06/14/2023, 3:48 PM   Clinical Narrative: Patient has orders to discharge home today. Chart reviewed. No TOC needs identified. CSW signing off.  Transition of Care Asessment: Insurance and Status: Insurance coverage has been reviewed Patient has primary care physician: Yes Home environment has been reviewed: Single family home Prior level of function:: Not documented Prior/Current Home Services: No current home services Social Drivers of Health Review: SDOH reviewed no interventions necessary Readmission risk has been reviewed: Yes Transition of care needs: no transition of care needs at this time

## 2023-06-14 NOTE — Discharge Summary (Addendum)
 Physician Discharge Summary   Patient: Ryan Gallagher MRN: [id number] DOB: [birth year]  Admit date:     06/13/2023  Discharge date: 06/14/2023  Discharge Physician: Pennie Banter   PCP: Sallyanne Kuster, NP   Recommendations at discharge:    Follow up with Cardiology Follow up with Primary Care Repeat CBC BMP at follow up  Discharge Diagnoses: Principal Problem:   STEMI (ST elevation myocardial infarction) (HCC) Active Problems:   BPH with obstruction/lower urinary tract symptoms   Nicotine dependence   Coronary artery disease involving native coronary artery of native heart with unstable angina pectoris (HCC)   Acute ST elevation myocardial infarction (STEMI) of lateral wall (HCC)   Hyperlipidemia  Resolved Problems:   * No resolved hospital problems. *  Hospital Course:  "Ryan Gallagher is a 68 year old male with history of daily tobacco use, hyperlipidemia, CAD, status post PCI to large OM1, presents emergency department for chief concerns of chest pain and shortness of breath. "   Pt was found to have STEMI, was emergently taken to the cath lab with cardiology, underwent successful PCI/DES to OM1 lesion 99% occluded.   Further hospital course and management as outlined below.  Assessment and Plan:  STEMI (ST elevation myocardial infarction) [facility]) --Cardiology following --ASA and EFFIENT x minimum 12 months (Brilinta cancelled due to high out of pocket cost - discussed with Cardiolody and Clinical Pharmacist)  --Effient 60 mg tomorrow, then 10 mg daily --Statin --Smoking cessation - sent for nicotine patches and continue Chantix --Echo post-cath - EF 60-65% --Follow up further Cardiology recommendations - cleared for discharge today   Hyperlipidemia --Statin   Coronary artery disease involving native coronary artery of native heart with unstable angina pectoris (HCC) --See STEMI   Nicotine dependence --nicotine patch ordered Pt counseled on  importance of tobacco cessation including increased risk of future cardiac blockage and stroke and cancer         Consultants: Cardiologu Procedures performed: as above  Disposition: Home Diet recommendation:  Discharge Diet Orders (From admission, onward)     Start     Ordered   06/14/23 0000  Diet - low sodium heart healthy        06/14/23 1532            DISCHARGE MEDICATION: Allergies as of 06/14/2023   No Known Allergies      Medication List     TAKE these medications    albuterol 108 (90 Base) MCG/ACT inhaler Commonly known as: VENTOLIN HFA Inhale 2 puffs into the lungs every 6 (six) hours as needed for wheezing or shortness of breath.   aspirin EC 81 MG tablet Take 1 tablet (81 mg total) by mouth daily. Swallow whole.   aspirin-acetaminophen-caffeine 250-250-65 MG tablet Commonly known as: EXCEDRIN MIGRAINE Take 1 tablet by mouth every 6 (six) hours as needed for headache.   BENADRYL ALLERGY PO Take by mouth.   Benadryl Itch Stopping 2 % Gel Generic drug: DIPHENHYDRAMINE HCL (TOPICAL) Apply topically.   calcium carbonate 500 MG chewable tablet Commonly known as: TUMS - dosed in mg elemental calcium Chew 1 tablet by mouth as needed for indigestion or heartburn.   ezetimibe 10 MG tablet Commonly known as: ZETIA Take 1 tablet (10 mg total) by mouth daily.   magnesium gluconate 500 (27 Mg) MG Tabs tablet Commonly known as: MAGONATE Take 500 mg by mouth daily at 6 (six) AM.   meloxicam 7.5 MG tablet Commonly known as: MOBIC Take 1 tablet (7.5 mg  total) by mouth daily.   methocarbamol 500 MG tablet Commonly known as: ROBAXIN Take 1 tablet (500 mg total) by mouth every 8 (eight) hours as needed for muscle spasms.   multivitamin with minerals tablet Take 1 tablet by mouth daily.   nicotine 21 mg/24hr patch Commonly known as: NICODERM CQ - dosed in mg/24 hours Place 1 patch (21 mg total) onto the skin daily as needed (nicotine craving).    nitroGLYCERIN 0.4 MG SL tablet Commonly known as: NITROSTAT Place 1 tablet (0.4 mg total) under the tongue every 5 (five) minutes as needed for chest pain.   prasugrel 10 MG Tabs tablet Commonly known as: Effient Take 6 tablets (60 mg total) by mouth daily for 1 day, THEN 1 tablet (10 mg total) daily. Start taking on: June 15, 2023   rosuvastatin 40 MG tablet Commonly known as: CRESTOR Take 1 tablet (40 mg total) by mouth daily.   Varenicline Tartrate (Starter) 0.5 MG X 11 & 1 MG X 42 Tbpk Take 0.5 mg by mouth once daily on days 1-3, then take 0.5 mg twice daily on days 4-7, then increase to 1 mg twice daily   vitamin B-12 100 MCG tablet Commonly known as: CYANOCOBALAMIN Take 100 mcg by mouth daily.        Discharge Exam: Filed Weights   06/13/23 1700  Weight: 89.2 kg   General exam: awake, alert, no acute distress HEENT: atraumatic, clear conjunctiva, anicteric sclera, moist mucus membranes, hearing grossly normal  Respiratory system: CTAB, no wheezes, rales or rhonchi, normal respiratory effort. Cardiovascular system: normal S1/S2, RRR, no JVD, murmurs, rubs, gallops, no pedal edema.   Gastrointestinal system: soft, NT, ND, no HSM felt, +bowel sounds. Central nervous system: A&O x 3. no gross focal neurologic deficits, normal speech Extremities: moves all , no edema, normal tone Skin: dry, intact, normal temperature, normal color, No rashes, lesions or ulcers Psychiatry: normal mood, congruent affect, judgement and insight appear normal   Condition at discharge: stable  The results of significant diagnostics from this hospitalization (including imaging, microbiology, ancillary and laboratory) are listed below for reference.   Imaging Studies: ECHOCARDIOGRAM COMPLETE Result Date: 06/14/2023    ECHOCARDIOGRAM REPORT   Patient Name:   Ryan Gallagher Date of Exam: 06/14/2023 Medical Rec #:  [id number]         Height:       77.0 in Accession #:    [id number]         Weight:       196.6 lb Date of Birth:  09/09/1955         BSA:          2.221 m Patient Age:    68 years          BP:           115/73 mmHg Patient Gender: M                 HR:           57 bpm. Exam Location:  [facility] Procedure: 2D Echo, Cardiac Doppler, Color Doppler, Strain Analysis and 3D Echo            (Both Spectral and Color Flow Doppler were utilized during            procedure). Indications:     Acute myocardial infarction, unspecified I21.9  History:         Patient has prior history of Echocardiogram examinations, most  recent 10/06/2022. COPD. Headache.  Sonographer:     Cristela Blue Referring Phys:  [id number] AMY N COX Diagnosing Phys: Julien Nordmann MD  Sonographer Comments: Global longitudinal strain was attempted. IMPRESSIONS  1. Left ventricular ejection fraction, by estimation, is 60 to 65%. The left ventricle has normal function. The left ventricle has no regional wall motion abnormalities. Left ventricular diastolic parameters are indeterminate. The average left ventricular global longitudinal strain is -13.8 %. The global longitudinal strain is abnormal.  2. Right ventricular systolic function is normal. The right ventricular size is normal. There is normal pulmonary artery systolic pressure. The estimated right ventricular systolic pressure is 22.1 mmHg.  3. The mitral valve is normal in structure. No evidence of mitral valve regurgitation. No evidence of mitral stenosis.  4. The aortic valve is normal in structure. Aortic valve regurgitation is not visualized. No aortic stenosis is present.  5. The inferior vena cava is normal in size with greater than 50% respiratory variability, suggesting right atrial pressure of 3 mmHg. FINDINGS  Left Ventricle: Left ventricular ejection fraction, by estimation, is 60 to 65%. The left ventricle has normal function. The left ventricle has no regional wall motion abnormalities. The average left ventricular global longitudinal strain is -13.8 %.  Strain was performed and the global longitudinal strain is abnormal. The left ventricular internal cavity size was normal in size. There is no left ventricular hypertrophy. Left ventricular diastolic parameters are indeterminate. Right Ventricle: The right ventricular size is normal. No increase in right ventricular wall thickness. Right ventricular systolic function is normal. There is normal pulmonary artery systolic pressure. The tricuspid regurgitant velocity is 2.07 m/s, and  with an assumed right atrial pressure of 5 mmHg, the estimated right ventricular systolic pressure is 22.1 mmHg. Left Atrium: Left atrial size was normal in size. Right Atrium: Right atrial size was normal in size. Pericardium: There is no evidence of pericardial effusion. Mitral Valve: The mitral valve is normal in structure. No evidence of mitral valve regurgitation. No evidence of mitral valve stenosis. MV peak gradient, 2.7 mmHg. The mean mitral valve gradient is 1.0 mmHg. Tricuspid Valve: The tricuspid valve is normal in structure. Tricuspid valve regurgitation is mild . No evidence of tricuspid stenosis. Aortic Valve: The aortic valve is normal in structure. Aortic valve regurgitation is not visualized. No aortic stenosis is present. Aortic valve mean gradient measures 3.0 mmHg. Aortic valve peak gradient measures 5.2 mmHg. Aortic valve area, by VTI measures 4.19 cm. Pulmonic Valve: The pulmonic valve was normal in structure. Pulmonic valve regurgitation is not visualized. No evidence of pulmonic stenosis. Aorta: The aortic root is normal in size and structure. Venous: The inferior vena cava is normal in size with greater than 50% respiratory variability, suggesting right atrial pressure of 3 mmHg. IAS/Shunts: No atrial level shunt detected by color flow Doppler. Additional Comments: 3D was performed not requiring image post processing on an independent workstation and was indeterminate.  LEFT VENTRICLE PLAX 2D LVIDd:         4.60  cm LVIDs:         3.40 cm   2D Longitudinal Strain LV PW:         0.90 cm   2D Strain GLS Avg:     -13.8 % LV IVS:        1.20 cm LVOT diam:     2.30 cm LV SV:         91 LV SV Index:   41 LVOT Area:  4.15 cm  RIGHT VENTRICLE RV Basal diam:  3.70 cm RV Mid diam:    3.00 cm RV S prime:     17.10 cm/s TAPSE (M-mode): 2.7 cm LEFT ATRIUM             Index        RIGHT ATRIUM           Index LA diam:        2.60 cm 1.17 cm/m   RA Area:     22.20 cm LA Vol (A2C):   34.3 ml 15.44 ml/m  RA Volume:   72.10 ml  32.47 ml/m LA Vol (A4C):   15.9 ml 7.16 ml/m LA Biplane Vol: 24.5 ml 11.03 ml/m  AORTIC VALVE AV Area (Vmax):    4.23 cm AV Area (Vmean):   4.12 cm AV Area (VTI):     4.19 cm AV Vmax:           114.00 cm/s AV Vmean:          73.700 cm/s AV VTI:            0.217 m AV Peak Grad:      5.2 mmHg AV Mean Grad:      3.0 mmHg LVOT Vmax:         116.00 cm/s LVOT Vmean:        73.000 cm/s LVOT VTI:          0.219 m LVOT/AV VTI ratio: 1.01  AORTA Ao Root diam: 3.00 cm MITRAL VALVE               TRICUSPID VALVE MV Area (PHT): 3.17 cm    TR Peak grad:   17.1 mmHg MV Area VTI:   2.75 cm    TR Vmax:        207.00 cm/s MV Peak grad:  2.7 mmHg MV Mean grad:  1.0 mmHg    SHUNTS MV Vmax:       0.81 m/s    Systemic VTI:  0.22 m MV Vmean:      52.2 cm/s   Systemic Diam: 2.30 cm MV Decel Time: 239 msec MV E velocity: 73.40 cm/s MV A velocity: 63.90 cm/s MV E/A ratio:  1.15 Julien Nordmann MD Electronically signed by Julien Nordmann MD Signature Date/Time: 06/14/2023/3:13:43 PM    Final    CARDIAC CATHETERIZATION Result Date: 06/13/2023   Mid RCA lesion is 20% stenosed.   Prox RCA lesion is 15% stenosed.   Mid LAD lesion is 40% stenosed.   1st Mrg lesion is 99% stenosed.   A stent was successfully placed.   A drug-eluting stent was successfully placed using a STENT ONYX FRONTIER 3.0X18.   Post intervention, there is a 0% residual stenosis.   The left ventricular systolic function is normal.   LV end diastolic pressure is  mildly elevated.   The left ventricular ejection fraction is 50-55% by visual estimate. 1.  Lateral wall STEMI 2.  99% stenosis large OM1 3.  Normal left ventricular function 4.  Successful primary PCI with 3.0 x 18 mm Onyx Frontier DES OM1 Recommendations 1.  Dual antiplatelet therapy uninterrupted x 1 year 2.  2D echocardiogram 3.  Resume rosuvastatin 40 mg daily 4.  Defer starting beta-blocker at this time due to baseline bradycardia   DG Chest 2 View Result Date: 06/13/2023 CLINICAL DATA:  Chest pain. EXAM: CHEST - 2 VIEW COMPARISON:  03/09/2021. FINDINGS: Bilateral lung fields are clear. Bilateral costophrenic angles are clear.  Normal cardio-mediastinal silhouette. No acute osseous abnormalities. The soft tissues are within normal limits. IMPRESSION: No active cardiopulmonary disease. Electronically Signed   By: Jules Schick M.D.   On: 06/13/2023 14:13    Microbiology: Results for orders placed or performed during the hospital encounter of 06/13/23  MRSA Next Gen by PCR, Nasal     Status: None   Collection Time: 06/13/23  4:40 PM   Specimen: Nasal Mucosa; Nasal Swab  Result Value Ref Range Status   MRSA by PCR Next Gen NOT DETECTED NOT DETECTED Final    Comment: (NOTE) The GeneXpert MRSA Assay (FDA approved for NASAL specimens only), is one component of a comprehensive MRSA colonization surveillance program. It is not intended to diagnose MRSA infection nor to guide or monitor treatment for MRSA infections. Test performance is not FDA approved in patients less than 38 years old. Performed at [facility], [address] [place], [place] [address]     Labs: CBC: Recent Labs  Lab 06/13/23 1116 06/14/23 0435  WBC 13.0* 9.0  HGB 16.3 14.7  HCT 48.6 42.8  MCV 91.7 90.5  PLT 232 205   Basic Metabolic Panel: Recent Labs  Lab 06/13/23 1116 06/14/23 0435  NA 135 138  K 4.6 4.0  CL 101 105  CO2 26 22  GLUCOSE 104* 97  BUN 16 16  CREATININE 1.01 0.93  CALCIUM  9.4 8.7*   Liver Function Tests: No results for input(s): "AST", "ALT", "ALKPHOS", "BILITOT", "PROT", "ALBUMIN" in the last 168 hours. CBG: Recent Labs  Lab 06/13/23 1633  GLUCAP 97    Discharge time spent: less than 30 minutes.  Signed: Pennie Banter, DO [facility] 06/14/2023

## 2023-06-14 NOTE — Progress Notes (Signed)
*  PRELIMINARY RESULTS* Echocardiogram 2D Echocardiogram has been performed.  Cristela Blue 06/14/2023, 2:57 PM

## 2023-06-14 NOTE — Progress Notes (Signed)
  Progress Note   Patient: Ryan Gallagher HKV:[id number] DOB: Jun 01, 1955 DOA: 06/13/2023     1 DOS: the patient was seen and examined on 06/14/2023   Brief hospital course: "Ryan Gallagher is a 68 year old male with history of daily tobacco use, hyperlipidemia, CAD, status post PCI to large OM1, presents emergency department for chief concerns of chest pain and shortness of breath. "  Pt was found to have STEMI, was emergently taken to the cath lab with cardiology, underwent successful PCI/DES to OM1 lesion 99% occluded.  Further hospital course and management as outlined below.   Assessment and Plan:  STEMI (ST elevation myocardial infarction) [facility]) --Cardiology following --ASA and Brilinta x minimum 12 months --Statin --Smoking cessation --Follow up pending Echo --Follow up further Cardiology recommendations   Hyperlipidemia --Statin  Coronary artery disease involving native coronary artery of native heart with unstable angina pectoris (HCC) --See STEMI  Nicotine dependence --nicotine patch ordered Pt counseled on importance of tobacco cessation including increased risk of future cardiac blockage and stroke and cancer       Subjective: Pt seen in stepdown unit this AM.  He'd had an episode of chest discomfort earlier this AM, feels better now.  Denies other complaints or issues.  Physical Exam: Vitals:   06/14/23 1130 06/14/23 1145 06/14/23 1200 06/14/23 1215  BP:   115/73   Pulse: 61 (!) 58 62 (!) 57  Resp: 12 15 20 16   Temp:      TempSrc:      SpO2: 95% 94% 94% 94%  Weight:      Height:       General exam: awake, alert, no acute distress HEENT: atraumatic, clear conjunctiva, anicteric sclera, moist mucus membranes, hearing grossly normal  Respiratory system: CTAB, no wheezes, rales or rhonchi, normal respiratory effort. Cardiovascular system: normal S1/S2, RRR, no JVD, murmurs, rubs, gallops, no pedal edema.   Gastrointestinal system: soft, NT, ND, no  HSM felt, +bowel sounds. Central nervous system: A&O x 3. no gross focal neurologic deficits, normal speech Extremities: moves all, no edema, normal tone Skin: dry, intact, normal temperature Psychiatry: normal mood, congruent affect, judgement and insight appear normal   Data Reviewed:  Notable labs -- normal BMP except Ca 8.7, normal CBC  Family Communication: wife had stepped out when pt seen on rounds  Disposition: Status is: Inpatient Remains inpatient appropriate because: pending clearance by cardiology for discharge   Planned Discharge Destination: Home    Time spent: 42 minutes  Author: Pennie Banter, DO 06/14/2023 1:24 PM  For on call review [email or web address].

## 2023-06-14 NOTE — Progress Notes (Addendum)
 Cardiology Progress Note   Patient Name: Ryan Gallagher Date of Encounter: 06/14/2023  Primary Cardiologist: Lorine Bears, MD  Subjective   Reports having some left chest discomfort when moving his left arm Denies central chest pain Discussed events from yesterday, wife at the bedside Stuttering chest pain radiating into his chest and neck over the past several days, severe yesterday, STEMI called while in the hospital emergency room with acute EKG ST elevation in V5 V6 I aVL  Objective   Inpatient Medications    Scheduled Meds:  aspirin  81 mg Oral Daily   Chlorhexidine Gluconate Cloth  6 each Topical Daily   ezetimibe  10 mg Oral Daily   heparin  5,000 Units Subcutaneous Q8H   rosuvastatin  40 mg Oral Daily   sodium chloride flush  3 mL Intravenous Q12H   ticagrelor  90 mg Oral BID   Continuous Infusions:  sodium chloride     amiodarone 30 mg/hr (06/14/23 0632)   sodium chloride     PRN Meds: sodium chloride, acetaminophen **OR** acetaminophen, acetaminophen, morphine injection, morphine injection, nicotine, nitroGLYCERIN, ondansetron (ZOFRAN) IV, ondansetron **OR** ondansetron (ZOFRAN) IV, sodium chloride flush   Vital Signs    Vitals:   06/14/23 0715 06/14/23 0730 06/14/23 0805 06/14/23 0815  BP:   115/67   Pulse: (!) 56 (!) 58 (!) 57 64  Resp: 11 10 14 13   Temp:      TempSrc:      SpO2: 94% 95% 95%   Weight:      Height:        Intake/Output Summary (Last 24 hours) at 06/14/2023 0850 Last data filed at 06/14/2023 0730 Gross per 24 hour  Intake 1279.43 ml  Output 1450 ml  Net -170.57 ml   Filed Weights   06/13/23 1700  Weight: 89.2 kg    Physical Exam   Constitutional:  oriented to person, place, and time. No distress.  HENT:  Head: Grossly normal Eyes:  no discharge. No scleral icterus.  Neck: No JVD, no carotid bruits  Cardiovascular: Regular rate and rhythm, no murmurs appreciated Pulmonary/Chest: Clear to auscultation bilaterally, no  wheezes or rails Abdominal: Soft.  no distension.  no tenderness.  Musculoskeletal: Normal range of motion Neurological:  normal muscle tone. Coordination normal. No atrophy Skin: Skin warm and dry Psychiatric: normal affect, pleasant  Labs    Chemistry Recent Labs  Lab 06/13/23 1116 06/14/23 0435  NA 135 138  K 4.6 4.0  CL 101 105  CO2 26 22  GLUCOSE 104* 97  BUN 16 16  CREATININE 1.01 0.93  CALCIUM 9.4 8.7*  GFRNONAA >60 >60  ANIONGAP 8 11     Hematology Recent Labs  Lab 06/13/23 1116 06/14/23 0435  WBC 13.0* 9.0  RBC 5.30 4.73  HGB 16.3 14.7  HCT 48.6 42.8  MCV 91.7 90.5  MCH 30.8 31.1  MCHC 33.5 34.3  RDW 13.2 13.2  PLT 232 205    Cardiac Enzymes  Recent Labs  Lab 06/13/23 1116 06/13/23 1354  TROPONINIHS 21* 323*     Lipids  Lab Results  Component Value Date   CHOL 154 01/11/2023   HDL 37 (L) 01/11/2023   LDLCALC 94 01/11/2023   TRIG 131 01/11/2023   CHOLHDL 4.2 01/11/2023    HbA1c  Lab Results  Component Value Date   HGBA1C 5.9 (H) 05/03/2022    Radiology    DG Chest 2 View Result Date: 06/13/2023 CLINICAL DATA:  Chest pain. EXAM: CHEST -  2 VIEW COMPARISON:  03/09/2021. FINDINGS: Bilateral lung fields are clear. Bilateral costophrenic angles are clear. Normal cardio-mediastinal silhouette. No acute osseous abnormalities. The soft tissues are within normal limits. IMPRESSION: No active cardiopulmonary disease. Electronically Signed   By: Jules Schick M.D.   On: 06/13/2023 14:13     Telemetry    Normal sinus rhythm- Personally Reviewed  ECG     - Personally Reviewed  Cardiac Studies   Echo pending   2D Echocardiogram 7.2024  1. Left ventricular ejection fraction, by estimation, is 55 to 60%. Left  ventricular ejection fraction by 2D MOD biplane is 59.6 %. The left  ventricle has normal function. The left ventricle has no regional wall  motion abnormalities. Left ventricular  diastolic parameters were normal.   2. Right  ventricular systolic function is normal. The right ventricular  size is normal.   3. The mitral valve is normal in structure. No evidence of mitral valve  regurgitation.   4. The aortic valve is tricuspid. Aortic valve regurgitation is not  visualized.   5. Aortic dilatation noted. There is mild dilatation of the ascending  aorta, measuring 40 mm.   6. The inferior vena cava is normal in size with greater than 50%  respiratory variability, suggesting right atrial pressure of 3 mmHg.  _____________   Cardiac Catheterization and Percutaneous Coronary Intervention 3.19.2025  Diagnostic Dominance: Right  Intervention - OM1  3.0 x 18 Onyx Frontier DES   Left Ventricle The left ventricular size is normal. The left ventricular systolic function is normal. LV end diastolic pressure is mildly elevated. The left ventricular ejection fraction is 50-55% by visual estimate. No regional wall motion abnormalities.  _____________   Patient Profile     68 y.o. male w/ a h/o nonobs CAD by cath in 09/2022, who presented 3/19 w/ recurrent chest and neck pain followed by development of lateral ST elevation  S/p PCI/DES OM1.  EF 55% by LV gram.  Assessment & Plan    1.  Acute lateral STEMI/CAD:   Stuttering chest pain over the past several days, Acute STEMI while in the emergency room with ST elevation anterolaterally Taken urgently to the catheterization lab, 99% marginal branch noted s/p PCI/DES to the OM1.    Cont asa, statin, brilinta.  Smoking cessation recommended Echocardiogram pending Not on beta-blocker secondary to bradycardia Cardiac rehab  2.  NSVT:   Noted during cath and placed on IV amio.   No significant arrhythmia overnight, will wean off amiodarone  3.  HL:   LDL 140 in 07/2022.   On rosuvastatin 40.   F/u fasting lipids in AM.  4.  Tob Abuse: We have encouraged him to continue to work on weaning his cigarettes and smoking cessation. He will continue to work on this and  does not want any assistance with chantix.    Family at the bedside, long discussion concerning recent symptoms, cardiac catheterization results, recovery, timing ongoing home  Signed, Dossie Arbour, MD, Ph.D [facility]  For questions or updates, please contact   Please consult [email or web address] for contact info under Cardiology/STEMI.

## 2023-06-15 LAB — LIPOPROTEIN A (LPA): Lipoprotein (a): 16.2 nmol/L (ref <75.0)

## 2023-06-18 ENCOUNTER — Telehealth: Payer: Self-pay | Admitting: Nurse Practitioner

## 2023-06-18 NOTE — Telephone Encounter (Signed)
Lvm to schedule hospital follow up

## 2023-07-03 ENCOUNTER — Ambulatory Visit (INDEPENDENT_AMBULATORY_CARE_PROVIDER_SITE_OTHER): Admitting: Nurse Practitioner

## 2023-07-03 ENCOUNTER — Encounter: Payer: Self-pay | Admitting: Nurse Practitioner

## 2023-07-03 VITALS — BP 128/72 | HR 72 | Temp 98.3°F | Resp 16 | Ht 77.0 in | Wt 233.4 lb

## 2023-07-03 DIAGNOSIS — G4701 Insomnia due to medical condition: Secondary | ICD-10-CM | POA: Diagnosis not present

## 2023-07-03 DIAGNOSIS — I25110 Atherosclerotic heart disease of native coronary artery with unstable angina pectoris: Secondary | ICD-10-CM

## 2023-07-03 DIAGNOSIS — F4321 Adjustment disorder with depressed mood: Secondary | ICD-10-CM

## 2023-07-03 DIAGNOSIS — Z09 Encounter for follow-up examination after completed treatment for conditions other than malignant neoplasm: Secondary | ICD-10-CM

## 2023-07-03 DIAGNOSIS — I2129 ST elevation (STEMI) myocardial infarction involving other sites: Secondary | ICD-10-CM

## 2023-07-03 MED ORDER — TRAZODONE HCL 50 MG PO TABS: 50.0000 mg | ORAL_TABLET | Freq: Every day | ORAL | 5 refills | Status: DC

## 2023-07-03 NOTE — Progress Notes (Signed)
 [facility] [place], [place] [address] [place] [address] [phone]                                   Transitional Care Clinic   [facility] Discharge Acute Issues Care Follow Up                                                                        Patient Demographics  Ryan Gallagher, is a 68 y.o. male  DOB 05-Sep-1955  MRN [id number].  Primary MD  Sallyanne Kuster, NP  Admit date:     06/13/2023  Discharge date: 06/14/2023    Reason for TCC follow Up - STEMI   Past Medical History:  Diagnosis Date   Arthritis    COPD, mild (HCC)    Diverticulitis    Enlarged prostate    GERD (gastroesophageal reflux disease)    OCC   Headache    MIGRAINES   History of hiatal hernia     Past Surgical History:  Procedure Laterality Date   CATARACT EXTRACTION W/PHACO Left 11/29/2021   Procedure: CATARACT EXTRACTION PHACO AND INTRAOCULAR LENS PLACEMENT (IOC) LEFT;  Surgeon: Galen Manila, MD;  Location: [facility];  Service: Ophthalmology;  Laterality: Left;  6.28 1:07.0   CORONARY PRESSURE/FFR STUDY N/A 10/02/2022   Procedure: CORONARY PRESSURE/FFR STUDY;  Surgeon: Iran Ouch, MD;  Location: [facility] INVASIVE CV LAB;  Service: Cardiovascular;  Laterality: N/A;   CORONARY/GRAFT ACUTE MI REVASCULARIZATION N/A 06/13/2023   Procedure: Coronary/Graft Acute MI Revascularization;  Surgeon: Marcina Millard, MD;  Location: [facility] INVASIVE CV LAB;  Service: Cardiovascular;  Laterality: N/A;   HOLEP-LASER ENUCLEATION OF THE PROSTATE WITH MORCELLATION N/A 04/19/2018   Procedure: HOLEP-LASER ENUCLEATION OF THE PROSTATE WITH MORCELLATION;  Surgeon: Sondra Come, MD;  Location: [facility] ORS;  Service: Urology;  Laterality: N/A;   LEFT HEART CATH AND CORONARY ANGIOGRAPHY Left 10/02/2022   Procedure: LEFT HEART CATH AND CORONARY ANGIOGRAPHY;  Surgeon: Iran Ouch, MD;  Location: [facility] INVASIVE CV LAB;  Service: Cardiovascular;  Laterality: Left;   LEFT  HEART CATH AND CORONARY ANGIOGRAPHY N/A 06/13/2023   Procedure: LEFT HEART CATH AND CORONARY ANGIOGRAPHY;  Surgeon: Marcina Millard, MD;  Location: [facility] INVASIVE CV LAB;  Service: Cardiovascular;  Laterality: N/A;   NO PAST SURGERIES         Recent HPI and Hospital Course  Hospital Course:   "Mr. Ryan Gallagher is a 68 year old male with history of daily tobacco use, hyperlipidemia, CAD, status post PCI to large OM1, presents emergency department for chief concerns of chest pain and shortness of breath. "   Pt was found to have STEMI, was emergently taken to the cath lab with cardiology, underwent successful PCI/DES to OM1 lesion 99% occluded.   Further hospital course and management as outlined below.   Assessment and Plan:   STEMI (ST elevation myocardial infarction) [facility]) --Cardiology following --ASA and EFFIENT x minimum 12 months (Brilinta cancelled due to high out of pocket cost - discussed with Cardiolody and Clinical Pharmacist)  --Effient 60 mg tomorrow, then 10 mg daily --Statin --Smoking cessation - sent for nicotine patches and continue  Chantix --Echo post-cath - EF 60-65% --Follow up further Cardiology recommendations - cleared for discharge today   Hyperlipidemia --Statin   Coronary artery disease involving native coronary artery of native heart with unstable angina pectoris (HCC) --See STEMI   Nicotine dependence --nicotine patch ordered Pt counseled on importance of tobacco cessation including increased risk of future cardiac blockage and stroke and cancer        Post Hospital Acute Care Issue to be followed in the Clinic   Principal Problem:   STEMI (ST elevation myocardial infarction) (HCC) Active Problems:   BPH with obstruction/lower urinary tract symptoms   Nicotine dependence   Coronary artery disease involving native coronary artery of native heart with unstable angina pectoris (HCC)   Acute ST elevation myocardial infarction (STEMI) of  lateral wall (HCC)   Hyperlipidemia   Subjective:   Dara Ear today has, No headache, No chest pain, No abdominal pain - No Nausea, No new weakness tingling or numbness, No Cough - SOB. Depressed mood, trouble sleeping, anhedonia  Assessment & Plan   1. Hospital discharge follow-up (Primary) Treated with PCI and stent placement while in the hospital. Was supposed to be started on brillinta but his copay was very high at more than $400 per month so he was started on effient instead for blood thinner.   2. Acute ST elevation myocardial infarction (STEMI) of lateral wall (HCC) PCI with stent placement while admitted in the hospital.   3. Coronary artery disease involving native coronary artery of native heart with unstable angina pectoris (HCC) Continue to follow up with cardiology   4. Insomnia due to medical condition Start trazodone as prescribed. Follow up 1 month  - traZODone (DESYREL) 50 MG tablet; Take 1-2 tablets (50-100 mg total) by mouth at bedtime.  Dispense: 60 tablet; Refill: 5  5. Situational depression The trazodone may help with his depressed mood as well.     Reason for frequent admissions/ER visits    MI COPD CAD   Objective:   Vitals:   07/03/23 1511  BP: 128/72  Pulse: 72  Resp: 16  Temp: 98.3 F (36.8 C)  SpO2: 96%  Weight: 233 lb 6.4 oz (105.9 kg)  Height: 6\' 5"  (1.956 m)    Wt Readings from Last 3 Encounters:  07/03/23 233 lb 6.4 oz (105.9 kg)  06/13/23 196 lb 10.4 oz (89.2 kg)  05/09/23 230 lb 3.2 oz (104.4 kg)    Allergies as of 07/03/2023   No Known Allergies      Medication List        Accurate as of July 03, 2023  3:38 PM. If you have any questions, ask your nurse or doctor.          albuterol 108 (90 Base) MCG/ACT inhaler Commonly known as: VENTOLIN HFA Inhale 2 puffs into the lungs every 6 (six) hours as needed for wheezing or shortness of breath.   aspirin EC 81 MG tablet Take 1 tablet (81 mg total) by mouth  daily. Swallow whole.   aspirin-acetaminophen-caffeine 250-250-65 MG tablet Commonly known as: EXCEDRIN MIGRAINE Take 1 tablet by mouth every 6 (six) hours as needed for headache.   BENADRYL ALLERGY PO Take by mouth.   Benadryl Itch Stopping 2 % Gel Generic drug: DIPHENHYDRAMINE HCL (TOPICAL) Apply topically.   calcium carbonate 500 MG chewable tablet Commonly known as: TUMS - dosed in mg elemental calcium Chew 1 tablet by mouth as needed for indigestion or heartburn.   ezetimibe 10 MG tablet Commonly  known as: ZETIA Take 1 tablet (10 mg total) by mouth daily.   magnesium gluconate 500 (27 Mg) MG Tabs tablet Commonly known as: MAGONATE Take 500 mg by mouth daily at 6 (six) AM.   meloxicam 7.5 MG tablet Commonly known as: MOBIC Take 1 tablet (7.5 mg total) by mouth daily.   methocarbamol 500 MG tablet Commonly known as: ROBAXIN Take 1 tablet (500 mg total) by mouth every 8 (eight) hours as needed for muscle spasms.   multivitamin with minerals tablet Take 1 tablet by mouth daily.   Nicotine Step 1 21 MG/24HR patch Generic drug: nicotine Place 1 patch (21 mg total) onto the skin daily as needed (nicotine craving).   nitroGLYCERIN 0.4 MG SL tablet Commonly known as: NITROSTAT Place 1 tablet (0.4 mg total) under the tongue every 5 (five) minutes as needed for chest pain.   prasugrel 10 MG Tabs tablet Commonly known as: Effient Take 6 tablets (60 mg total) by mouth daily for 1 day, THEN 1 tablet (10 mg total) daily. Start taking on: June 15, 2023   rosuvastatin 40 MG tablet Commonly known as: CRESTOR Take 1 tablet (40 mg total) by mouth daily.   traZODone 50 MG tablet Commonly known as: DESYREL Take 1-2 tablets (50-100 mg total) by mouth at bedtime. Started by: Zyona Pettaway   Varenicline Tartrate (Starter) 0.5 MG X 11 & 1 MG X 42 Tbpk Take 0.5 mg by mouth once daily on days 1-3, then take 0.5 mg twice daily on days 4-7, then increase to 1 mg twice daily    vitamin B-12 100 MCG tablet Commonly known as: CYANOCOBALAMIN Take 100 mcg by mouth daily.         Physical Exam: Constitutional: Patient appears well-developed and well-nourished. Not in obvious distress. HENT: Normocephalic, atraumatic, External right and left ear normal. Oropharynx is clear and moist.  Eyes: Conjunctivae and EOM are normal. PERRLA, no scleral icterus. Neck: Normal ROM. Neck supple. No JVD. No tracheal deviation. No thyromegaly. CVS: RRR, S1/S2 +, no murmurs, no gallops, no carotid bruit.  Pulmonary: Effort and breath sounds normal, no stridor, rhonchi, wheezes, rales.  Abdominal: Soft. BS +, no distension, tenderness, rebound or guarding.  Musculoskeletal: Normal range of motion. No edema and no tenderness.  Lymphadenopathy: No lymphadenopathy noted, cervical, inguinal or axillary Neuro: Alert. Normal reflexes, muscle tone coordination. No cranial nerve deficit. Skin: Skin is warm and dry. No rash noted. Not diaphoretic. No erythema. No pallor. Psychiatric: Normal mood and affect. Behavior, judgment, thought content normal.   Data Review   Micro Results No results found for this or any previous visit (from the past 240 hours).   CBC No results for input(s): "WBC", "HGB", "HCT", "PLT", "MCV", "MCH", "MCHC", "RDW", "LYMPHSABS", "MONOABS", "EOSABS", "BASOSABS", "BANDABS" in the last 168 hours.  Invalid input(s): "NEUTRABS", "BANDSABD"  Chemistries  No results for input(s): "NA", "K", "CL", "CO2", "GLUCOSE", "BUN", "CREATININE", "CALCIUM", "MG", "AST", "ALT", "ALKPHOS", "BILITOT" in the last 168 hours.  Invalid input(s): "GFRCGP" ------------------------------------------------------------------------------------------------------------------ estimated creatinine clearance is 93.1 mL/min (by C-G formula based on SCr of 0.93 mg/dL). ------------------------------------------------------------------------------------------------------------------ No results  for input(s): "HGBA1C" in the last 72 hours. ------------------------------------------------------------------------------------------------------------------ No results for input(s): "CHOL", "HDL", "LDLCALC", "TRIG", "CHOLHDL", "LDLDIRECT" in the last 72 hours. ------------------------------------------------------------------------------------------------------------------ No results for input(s): "TSH", "T4TOTAL", "T3FREE", "THYROIDAB" in the last 72 hours.  Invalid input(s): "FREET3" ------------------------------------------------------------------------------------------------------------------ No results for input(s): "VITAMINB12", "FOLATE", "FERRITIN", "TIBC", "IRON", "RETICCTPCT" in the last 72 hours.  Coagulation profile No results for input(s): "INR", "  PROTIME" in the last 168 hours.  No results for input(s): "DDIMER" in the last 72 hours.  Cardiac Enzymes No results for input(s): "CKMB", "TROPONINI", "MYOGLOBIN" in the last 168 hours.  Invalid input(s): "CK" ------------------------------------------------------------------------------------------------------------------ Invalid input(s): "POCBNP"  Return in about 4 weeks (around 07/31/2023) for F/U, eval new med, Lashaunta Sicard PCP trazodone.   Time Spent in minutes  45 Time spent with patient included reviewing progress notes, labs, imaging studies, and discussing plan for follow up.   This patient was seen by Laurence Pons, FNP-C in collaboration with Dr. Verneta Gone as a part of collaborative care agreement.   Laurence Pons MSN, FNP-C on 07/03/2023 at 3:38 PM   **Disclaimer: This note may have been dictated with voice recognition software. Similar sounding words can inadvertently be transcribed and this note may contain transcription errors which may not have been corrected upon publication of note.**

## 2023-07-05 ENCOUNTER — Ambulatory Visit: Attending: Medical | Admitting: Medical

## 2023-07-05 ENCOUNTER — Encounter: Payer: Self-pay | Admitting: Medical

## 2023-07-05 VITALS — BP 114/74 | HR 76 | Ht 77.0 in | Wt 231.6 lb

## 2023-07-05 DIAGNOSIS — E782 Mixed hyperlipidemia: Secondary | ICD-10-CM | POA: Insufficient documentation

## 2023-07-05 DIAGNOSIS — I2129 ST elevation (STEMI) myocardial infarction involving other sites: Secondary | ICD-10-CM | POA: Insufficient documentation

## 2023-07-05 DIAGNOSIS — Z720 Tobacco use: Secondary | ICD-10-CM | POA: Insufficient documentation

## 2023-07-05 DIAGNOSIS — I2510 Atherosclerotic heart disease of native coronary artery without angina pectoris: Secondary | ICD-10-CM | POA: Insufficient documentation

## 2023-07-05 MED ORDER — EZETIMIBE 10 MG PO TABS: 10.0000 mg | ORAL_TABLET | Freq: Every day | ORAL | 3 refills | Status: AC

## 2023-07-05 MED ORDER — PRASUGREL HCL 10 MG PO TABS: 10.0000 mg | ORAL_TABLET | Freq: Every day | ORAL | 3 refills | Status: AC

## 2023-07-05 MED ORDER — NITROGLYCERIN 0.4 MG SL SUBL: 0.4000 mg | SUBLINGUAL_TABLET | SUBLINGUAL | 3 refills | Status: DC | PRN

## 2023-07-05 NOTE — Patient Instructions (Signed)
 Medication Instructions:  STOP crestor 40mg  daily -- statin holiday   Continue all other current meds  *If you need a refill on your cardiac medications before your next appointment, please call your pharmacy*   Follow-Up: At [facility], you and your health needs are our priority.  As part of our continuing mission to provide you with exceptional heart care, our providers are all part of one team.  This team includes your primary Cardiologist (physician) and Advanced Practice Providers or APPs (Physician Assistants and Nurse Practitioners) who all work together to provide you with the care you need, when you need it.  Your next appointment:   4 weeks with Cadence PA  You have been referred to Cardiac Rehab

## 2023-07-05 NOTE — Progress Notes (Signed)
 Cardiology Office Note:  .   Date:  07/05/2023  ID:  Ryan Gallagher, DOB 09/10/1955, MRN [id number] PCP: Sallyanne Kuster, NP  [facility] Providers Cardiologist:  Lorine Bears, MD {    History of Present Illness: Ryan Gallagher is a 68 y.o. male with a hx of COPD/tobacco, CAD s/p PCI/DES Om1, HLD, ascending aortic dilation use who presents for hospital follow-up.   Patient was seen 08/18/2022 as a new patient reporting dizziness, shortness of breath and fatigue.  He reported worsening exertional shortness of breath and fatigue over the last 6 months.  He also reported associated dizziness.  CT scan of the lungs in March 2024 showed evidence of aortic and 3 vessel coronary artery calcification.  Echo and cardiac CT were ordered. Cardiac CTA showed coronary calcium score 492, 75th percentile for age and sex matched, severe proximal left circumflex stenosis greater than 70%, moderate proximal LAD stenosis. It was sent for FFR, which showed significant stenosis in the proximal left circumflex (FFR 0.78), and Cardiac cath was recommended. Cardiac cath showed moderate 60% stenosis in the mid left circumflex which was no significant by FFR, normal LVSF with mildly elevated LVEDP. No PCI was performed. Echo showed LVEF 55-60%, no WMA, aortic dilation measuring 40mm.   The patient presented 06/13/2023 with her lateral STEMI.  Cardiac cath showed 99% subtotal large OM1 that was treated with primary PCI/DES with excellent result.  Patient was started on DAPT with aspirin and Brilinta.  Echo showed EF 60 to 65%, normal RV SF.  Today, the patient reports he is aching all over, which my be from the statin. He feels breathing is the same. No further chest pain. Cath site is stable he is smoking 3 cigarettes daily.   Studies Reviewed: Marland Kitchen   EKG Interpretation Date/Time:  Thursday July 05 2023 10:59:21 EDT Ventricular Rate:  76 PR Interval:  160 QRS Duration:  94 QT Interval:  384 QTC  Calculation: 432 R Axis:   94  Text Interpretation: Normal sinus rhythm Possible Left atrial enlargement Rightward axis When compared with ECG of 14-Jun-2023 07:57, PREVIOUS ECG IS PRESENT Confirmed by Fransico Michael, Janene Yousuf ([id number]) on 07/05/2023 11:02:03 AM    Echo 05/2023 1. Left ventricular ejection fraction, by estimation, is 60 to 65%. The  left ventricle has normal function. The left ventricle has no regional  wall motion abnormalities. Left ventricular diastolic parameters are  indeterminate. The average left  ventricular global longitudinal strain is -13.8 %. The global longitudinal  strain is abnormal.   2. Right ventricular systolic function is normal. The right ventricular  size is normal. There is normal pulmonary artery systolic pressure. The  estimated right ventricular systolic pressure is 22.1 mmHg.   3. The mitral valve is normal in structure. No evidence of mitral valve  regurgitation. No evidence of mitral stenosis.   4. The aortic valve is normal in structure. Aortic valve regurgitation is  not visualized. No aortic stenosis is present.   5. The inferior vena cava is normal in size with greater than 50%  respiratory variability, suggesting right atrial pressure of 3 mmHg.   LHC 05/2023   Mid RCA lesion is 20% stenosed.   Prox RCA lesion is 15% stenosed.   Mid LAD lesion is 40% stenosed.   1st Mrg lesion is 99% stenosed.   A stent was successfully placed.   A drug-eluting stent was successfully placed using a STENT ONYX FRONTIER 3.0X18.   Post intervention, there is  a 0% residual stenosis.   The left ventricular systolic function is normal.   LV end diastolic pressure is mildly elevated.   The left ventricular ejection fraction is 50-55% by visual estimate.   1.  Lateral wall STEMI 2.  99% stenosis large OM1 3.  Normal left ventricular function 4.  Successful primary PCI with 3.0 x 18 mm Onyx Frontier DES OM1   Recommendations   1.  Dual antiplatelet therapy  uninterrupted x 1 year 2.  2D echocardiogram 3.  Resume rosuvastatin 40 mg daily 4.  Defer starting beta-blocker at this time due to baseline bradycardia      Physical Exam:   VS:  BP 114/74 (BP Location: Left Arm, Patient Position: Sitting, Cuff Size: Normal)   Pulse 76   Ht 6\' 5"  (1.956 m)   Wt 231 lb 9.6 oz (105.1 kg)   SpO2 97%   BMI 27.46 kg/m    Wt Readings from Last 3 Encounters:  07/05/23 231 lb 9.6 oz (105.1 kg)  07/03/23 233 lb 6.4 oz (105.9 kg)  06/13/23 196 lb 10.4 oz (89.2 kg)    GEN: Well nourished, well developed in no acute distress NECK: No JVD; No carotid bruits CARDIAC: RRR, no murmurs, rubs, gallops RESPIRATORY:  Clear to auscultation without rales, wheezing or rhonchi  ABDOMEN: Soft, non-tender, non-distended EXTREMITIES:  No edema; No deformity   ASSESSMENT AND PLAN: .    CAD Recent STEMI with PCI/DES OM1. He was started on ASA and Brilinta, but Brilinta was changed to Effient. He denies anginal symptoms. Cath site is stable. I will refer him to cardiac rehab. Continue ASA 81mg  daily Effient 10mg  daily, Zetia 10mg  daily, SL NTG. No BB due to baseline bradycardia. I will refill Effient today.  Tobacco use He is still smoking 3 cigarettes daily.   HLD LDL 94, HDL 37, TG 131, total chol 154. He feels achy all over, which may be from the statin. We will do a statin Holiday. Continue Zetia 10mg  daily. He may consider injectable at follow-up (may qualify for leqvio).     Cardiac Rehabilitation Eligibility Assessment       Dispo: Follow-up in 1 month  Signed, Quashon Jesus David Stall, PA-C

## 2023-07-07 ENCOUNTER — Encounter: Payer: Self-pay | Admitting: Nurse Practitioner

## 2023-07-30 DIAGNOSIS — Z961 Presence of intraocular lens: Secondary | ICD-10-CM | POA: Diagnosis not present

## 2023-07-30 DIAGNOSIS — H2511 Age-related nuclear cataract, right eye: Secondary | ICD-10-CM | POA: Diagnosis not present

## 2023-07-30 DIAGNOSIS — H0012 Chalazion right lower eyelid: Secondary | ICD-10-CM | POA: Diagnosis not present

## 2023-07-30 DIAGNOSIS — H179 Unspecified corneal scar and opacity: Secondary | ICD-10-CM | POA: Diagnosis not present

## 2023-08-03 ENCOUNTER — Encounter: Payer: Self-pay | Admitting: Medical

## 2023-08-03 ENCOUNTER — Ambulatory Visit: Attending: Medical | Admitting: Medical

## 2023-08-03 VITALS — BP 120/78 | HR 74 | Ht 77.0 in | Wt 221.0 lb

## 2023-08-03 DIAGNOSIS — E785 Hyperlipidemia, unspecified: Secondary | ICD-10-CM | POA: Diagnosis not present

## 2023-08-03 DIAGNOSIS — I2510 Atherosclerotic heart disease of native coronary artery without angina pectoris: Secondary | ICD-10-CM | POA: Diagnosis not present

## 2023-08-03 DIAGNOSIS — E782 Mixed hyperlipidemia: Secondary | ICD-10-CM | POA: Insufficient documentation

## 2023-08-03 MED ORDER — ROSUVASTATIN CALCIUM 5 MG PO TABS: 5.0000 mg | ORAL_TABLET | Freq: Every day | ORAL | 3 refills | Status: AC

## 2023-08-03 NOTE — Patient Instructions (Addendum)
 Medication Instructions:  Your physician has recommended you make the following change in your medication:   DECREASE Crestor  (Rosuvastatin ) to 5 mg once daily   *If you need a refill on your cardiac medications before your next appointment, please call your pharmacy*  Lab Work: Lipid & LFT to be done here in our office in 6-8 weeks. No appointment is needed. Hours for our lab are 8-4 and they are closed 1-2 for lunch. These are fasting labs so nothing to eat or drink after midnight except sip of water with your medications.   If you have labs (blood work) drawn today and your tests are completely normal, you will receive your results only by: MyChart Message (if you have MyChart) OR A paper copy in the mail If you have any lab test that is abnormal or we need to change your treatment, we will call you to review the results.  Testing/Procedures: None  Follow-Up: At [facility], you and your health needs are our priority.  As part of our continuing mission to provide you with exceptional heart care, our providers are all part of one team.  This team includes your primary Cardiologist (physician) and Advanced Practice Providers or APPs (Physician Assistants and Nurse Practitioners) who all work together to provide you with the care you need, when you need it.  Your next appointment:   3 month(s)  Provider:   Antionette Kirks, MD or Cadence Gennaro Khat, PA-C

## 2023-08-03 NOTE — Progress Notes (Signed)
 Cardiology Office Note:  .   Date:  08/03/2023  ID:  Ryan Gallagher, DOB 1955-06-11, MRN [id number] PCP: Laurence Pons, NP  [facility] Providers Cardiologist:  Antionette Kirks, MD     History of Present Illness: Ryan Gallagher is a 68 y.o. male  with a hx of COPD/tobacco use, CAD s/p PCI/DES Om1, HLD who presents for follow-up for CAD.   Patient was seen 08/18/2022 as a new patient reporting dizziness, shortness of breath and fatigue.  He reported worsening exertional shortness of breath and fatigue over the last 6 months.  He also reported associated dizziness.  CT scan of the lungs in March 2024 showed evidence of aortic and 3 vessel coronary artery calcification.  Echo and cardiac CT were ordered. Cardiac CTA showed coronary calcium  score 492, 75th percentile for age and sex matched, severe proximal left circumflex stenosis greater than 70%, moderate proximal LAD stenosis. It was sent for FFR, which showed significant stenosis in the proximal left circumflex (FFR 0.78), and Cardiac cath was recommended. Cardiac cath showed moderate 60% stenosis in the mid left circumflex which was no significant by FFR, normal LVSF with mildly elevated LVEDP. No PCI was performed. Echo showed LVEF 55-60%, no WMA, aortic dilation measuring 40mm.    The patient presented 06/13/2023 with her lateral STEMI.  Cardiac cath showed 99% subtotal large OM1 that was treated with primary PCI/DES with excellent result.  Patient was started on DAPT with aspirin  and Brilinta .  Echo showed EF 60 to 65%, normal RV SF.  Patient was last seen 07/05/23 reported that he was aching all over, suspected from the statin, so this was held.  Today, feels aches improved off the statin. He denies chest pain or SOB. He is working at home and taking breaks as needed. Blood thinner causes him to bruise easily. He is still smoking 4-5 cigarettes a day. He is not going to do cardiac rehab.   Studies Reviewed: [name]   EKG  Interpretation Date/Time:  Friday Aug 03 2023 09:11:17 EDT Ventricular Rate:  74 PR Interval:  156 QRS Duration:  90 QT Interval:  398 QTC Calculation: 441 R Axis:   96  Text Interpretation: Normal sinus rhythm with sinus arrhythmia Rightward axis When compared with ECG of 05-Jul-2023 10:59, No significant change was found Confirmed by Gennaro Khat, Adileny Delon ([id number]) on 08/03/2023 9:27:33 AM    Echo 05/2023 1. Left ventricular ejection fraction, by estimation, is 60 to 65%. The  left ventricle has normal function. The left ventricle has no regional  wall motion abnormalities. Left ventricular diastolic parameters are  indeterminate. The average left  ventricular global longitudinal strain is -13.8 %. The global longitudinal  strain is abnormal.   2. Right ventricular systolic function is normal. The right ventricular  size is normal. There is normal pulmonary artery systolic pressure. The  estimated right ventricular systolic pressure is 22.1 mmHg.   3. The mitral valve is normal in structure. No evidence of mitral valve  regurgitation. No evidence of mitral stenosis.   4. The aortic valve is normal in structure. Aortic valve regurgitation is  not visualized. No aortic stenosis is present.   5. The inferior vena cava is normal in size with greater than 50%  respiratory variability, suggesting right atrial pressure of 3 mmHg.    LHC 05/2023   Mid RCA lesion is 20% stenosed.   Prox RCA lesion is 15% stenosed.   Mid LAD lesion is 40% stenosed.  1st Mrg lesion is 99% stenosed.   A stent was successfully placed.   A drug-eluting stent was successfully placed using a STENT ONYX FRONTIER 3.0X18.   Post intervention, there is a 0% residual stenosis.   The left ventricular systolic function is normal.   LV end diastolic pressure is mildly elevated.   The left ventricular ejection fraction is 50-55% by visual estimate.   1.  Lateral wall STEMI 2.  99% stenosis large OM1 3.  Normal left  ventricular function 4.  Successful primary PCI with 3.0 x 18 mm Onyx Frontier DES OM1   Recommendations   1.  Dual antiplatelet therapy uninterrupted x 1 year 2.  2D echocardiogram 3.  Resume rosuvastatin  40 mg daily 4.  Defer starting beta-blocker at this time due to baseline bradycardia     Physical Exam:   VS:  BP 120/78 (BP Location: Left Arm, Patient Position: Sitting, Cuff Size: Large)   Pulse 74   Ht 6\' 5"  (1.956 m)   Wt 221 lb (100.2 kg)   SpO2 96%   BMI 26.21 kg/m    Wt Readings from Last 3 Encounters:  08/03/23 221 lb (100.2 kg)  07/05/23 231 lb 9.6 oz (105.1 kg)  07/03/23 233 lb 6.4 oz (105.9 kg)    GEN: Well nourished, well developed in no acute distress NECK: No JVD; No carotid bruits CARDIAC: RRR, no murmurs, rubs, gallops RESPIRATORY:  Clear to auscultation without rales, wheezing or rhonchi  ABDOMEN: Soft, non-tender, non-distended EXTREMITIES:  No edema; No deformity   ASSESSMENT AND PLAN: .    CAD He was admitted 05/2023 for STEMI treated with PCI/DES OM1. He does have bruising on DAPT, but this is tolerable. Continue DAPT with ASA and Effient  x 1 year. He denies anginal symptoms. He does not plan on doing cardiac rehab. He is staying busy at home. Continue ASA 81mg  daily, Effient  10mg  daily, Zetia  10mg  daily, SL NTG. No BB due to bradycardia, however 2 prior EKGs have shown NSR with HR in the 70s. May be able to add BB at follow-up.   HLD LDL 94. He had myalgias on high dose Crestor . I will start low dose Crestor  5mg  daily. Re-check Lipids/LFTs in 6-8 weeks.   Tobacco use He is still smoking 4-5 cigarettes daily. He did not tolerate chantix . He is trying to quit.     Cardiac Rehabilitation Eligibility Assessment  The patient has declined or is not appropriate for cardiac rehabilitation.       Dispo: Follow-up in 2-3 months  Signed, Cecilio Ohlrich Rebekah Canada, PA-C

## 2023-08-06 ENCOUNTER — Ambulatory Visit: Payer: Medicare Other | Admitting: Nurse Practitioner

## 2023-08-14 DIAGNOSIS — E785 Hyperlipidemia, unspecified: Secondary | ICD-10-CM | POA: Diagnosis not present

## 2023-08-14 DIAGNOSIS — E782 Mixed hyperlipidemia: Secondary | ICD-10-CM | POA: Diagnosis not present

## 2023-08-14 DIAGNOSIS — I2510 Atherosclerotic heart disease of native coronary artery without angina pectoris: Secondary | ICD-10-CM | POA: Diagnosis not present

## 2023-08-15 LAB — LIPID PANEL
Chol/HDL Ratio: 4.1 ratio (ref 0.0–5.0)
Cholesterol, Total: 150 mg/dL (ref 100–199)
HDL: 37 mg/dL — ABNORMAL LOW (ref >39)
LDL Chol Calc (NIH): 91 mg/dL (ref 0–99)
Triglycerides: 120 mg/dL (ref 0–149)
VLDL Cholesterol Cal: 22 mg/dL (ref 5–40)

## 2023-08-15 LAB — HEPATIC FUNCTION PANEL
ALT: 26 IU/L (ref 0–44)
AST: 25 IU/L (ref 0–40)
Albumin: 4.6 g/dL (ref 3.9–4.9)
Alkaline Phosphatase: 81 IU/L (ref 44–121)
Bilirubin Total: 0.4 mg/dL (ref 0.0–1.2)
Bilirubin, Direct: 0.15 mg/dL (ref 0.00–0.40)
Total Protein: 7 g/dL (ref 6.0–8.5)

## 2023-08-16 ENCOUNTER — Ambulatory Visit: Payer: Self-pay | Admitting: Medical

## 2023-08-16 DIAGNOSIS — Z79899 Other long term (current) drug therapy: Secondary | ICD-10-CM

## 2023-08-29 ENCOUNTER — Encounter: Payer: Self-pay | Admitting: Nurse Practitioner

## 2023-08-29 ENCOUNTER — Ambulatory Visit (INDEPENDENT_AMBULATORY_CARE_PROVIDER_SITE_OTHER): Admitting: Nurse Practitioner

## 2023-08-29 VITALS — BP 128/76 | HR 75 | Temp 97.1°F | Resp 16 | Ht 77.0 in | Wt 223.8 lb

## 2023-08-29 DIAGNOSIS — J432 Centrilobular emphysema: Secondary | ICD-10-CM

## 2023-08-29 DIAGNOSIS — I25110 Atherosclerotic heart disease of native coronary artery with unstable angina pectoris: Secondary | ICD-10-CM

## 2023-08-29 DIAGNOSIS — G4701 Insomnia due to medical condition: Secondary | ICD-10-CM | POA: Diagnosis not present

## 2023-08-29 DIAGNOSIS — F4321 Adjustment disorder with depressed mood: Secondary | ICD-10-CM

## 2023-08-29 NOTE — Progress Notes (Signed)
 [facility] [address] [place], [place] [address]  Internal MEDICINE  Office Visit Note  Patient Name: Ryan Gallagher  [id number]  [id number]  Date of Service: 08/29/2023  Chief Complaint  Patient presents with   Gastroesophageal Reflux   Follow-up    HPI [place] presents for a follow-up visit for insomnia Insomnia -- has been taking 1 tablet of the trazodone  at night and states it is helping some but he has been very busy lately.  Has a vacation planned in a couple of weeks Increased stress at work.  Using nicotine  lozenges for smoking cessation.  Sees cardiology again in August.    Current Medication: Outpatient Encounter Medications as of 08/29/2023  Medication Sig   albuterol  (VENTOLIN  HFA) 108 (90 Base) MCG/ACT inhaler Inhale 2 puffs into the lungs every 6 (six) hours as needed for wheezing or shortness of breath.   aspirin  EC 81 MG tablet Take 1 tablet (81 mg total) by mouth daily. Swallow whole.   aspirin -acetaminophen -caffeine  (EXCEDRIN  MIGRAINE) 250-250-65 MG per tablet Take 1 tablet by mouth every 6 (six) hours as needed for headache.   calcium  carbonate (TUMS - DOSED IN MG ELEMENTAL CALCIUM ) 500 MG chewable tablet Chew 1 tablet by mouth as needed for indigestion or heartburn.   diphenhydrAMINE HCl (BENADRYL ALLERGY PO) Take by mouth.   DIPHENHYDRAMINE HCL, TOPICAL, (BENADRYL ITCH STOPPING) 2 % GEL Apply topically.   ezetimibe  (ZETIA ) 10 MG tablet Take 1 tablet (10 mg total) by mouth daily.   Magnesium  Gluconate 500 (27 Mg) MG TABS Take 500 mg by mouth daily at 6 (six) AM.   meloxicam  (MOBIC ) 7.5 MG tablet Take 1 tablet (7.5 mg total) by mouth daily.   methocarbamol  (ROBAXIN ) 500 MG tablet Take 1 tablet (500 mg total) by mouth every 8 (eight) hours as needed for muscle spasms.   Multiple Vitamins-Minerals (MULTIVITAMIN WITH MINERALS) tablet Take 1 tablet by mouth daily.   nicotine  (NICODERM CQ  - DOSED IN MG/24 HOURS) 21 mg/24hr patch Place 1 patch (21 mg  total) onto the skin daily as needed (nicotine  craving).   nitroGLYCERIN  (NITROSTAT ) 0.4 MG SL tablet Place 1 tablet (0.4 mg total) under the tongue every 5 (five) minutes as needed for chest pain.   prasugrel  (EFFIENT ) 10 MG TABS tablet Take 1 tablet (10 mg total) by mouth daily.   rosuvastatin  (CRESTOR ) 5 MG tablet Take 1 tablet (5 mg total) by mouth daily.   traZODone  (DESYREL ) 50 MG tablet Take 1-2 tablets (50-100 mg total) by mouth at bedtime.   Varenicline  Tartrate, Starter, 0.5 MG X 11 & 1 MG X 42 TBPK Take 0.5 mg by mouth once daily on days 1-3, then take 0.5 mg twice daily on days 4-7, then increase to 1 mg twice daily   vitamin B-12 (CYANOCOBALAMIN ) 100 MCG tablet Take 100 mcg by mouth daily.   No facility-administered encounter medications on file as of 08/29/2023.    Surgical History: Past Surgical History:  Procedure Laterality Date   CATARACT EXTRACTION W/PHACO Left 11/29/2021   Procedure: CATARACT EXTRACTION PHACO AND INTRAOCULAR LENS PLACEMENT (IOC) LEFT;  Surgeon: Jaye Fallow, MD;  Location[facility];  Service: Ophthalmology;  Laterality: Left;  6.28 1:07.0   CORONARY PRESSURE/FFR STUDY N/A 10/02/2022   Procedure: CORONARY PRESSURE/FFR STUDY;  Surgeon: Darron Deatrice LABOR, MD;  Location: [facility] INV[facility];  Service: Cardiovascular;  Laterality: N/A;   CORONARY/GRAFT ACUTE MI REVASCULARIZATION N/A 06/13/2023   Procedure: Coronary/Graft Acute MI Revascularization;  Surgeon: Ammon Blunt,  MD;  Location: [facility] INVASIVE CV LAB;  Service: Cardiovascular;  Laterality: N/A;   HOLEP-LASER ENUCLEATION OF THE PROSTATE WITH MORCELLATION N/A 04/19/2018   Procedure: HOLEP-LASER ENUCLEATION OF THE PROSTATE WITH MORCELLATION;  Surgeon: Francisca Redell BROCKS, MD;  Location: [facility] ORS;  Service: Urology;  Laterality: N/A;   LEFT HEART CATH AND CORONARY ANGIOGRAPHY Left 10/02/2022   Procedure: LEFT HEART CATH AND CORONARY ANGIOGRAPHY;  Surgeon: Darron Deatrice LABOR, MD;  Location: [facility]  INVASIVE CV LAB;  Service: Cardiovascular;  Laterality: Left;   LEFT HEART CATH AND CORONARY ANGIOGRAPHY N/A 06/13/2023   Procedure: LEFT HEART CATH AND CORONARY ANGIOGRAPHY;  Surgeon: Ammon Blunt, MD;  Location: [facility] INVASIVE CV [facility];  Service: Cardiovascular;  Laterality: N/A;   NO PAST SURGERIES      Medical History: Past Medical History:  Diagnosis Date   Arthritis    COPD, mild (HCC)    Diverticulitis    Enlarged prostate    GERD (gastroesophageal reflux disease)    OCC   Headache    MIGRAINES   History of hiatal hernia     Family History: Family History  Problem Relation Age of Onset   Alzheimer's disease Mother    Heart disease Father    Alzheimer's disease Father    Diabetes Paternal Uncle    Prostate cancer Neg Hx    Bladder Cancer Neg Hx    Kidney cancer Neg Hx     Social History   Socioeconomic History   Marital status: Single    Spouse name: Not on file   Number of children: Not on file   Years of education: Not on file   Highest education level: Not on file  Occupational History   Not on file  Tobacco Use   Smoking status: Every Day    Current packs/day: 0.50    Average packs/day: 0.5 packs/day for 50.0 years (25.0 ttl pk-yrs)    Types: Cigarettes   Smokeless tobacco: Never  Vaping Use   Vaping status: Never Used  Substance and Sexual Activity   Alcohol use: Yes    Comment: occ   Drug use: Never   Sexual activity: Yes    Birth control/protection: None  Other Topics Concern   Not on file  Social History Narrative   Not on file   Social Drivers of Health   Financial Resource Strain: Not on file  Food Insecurity: No Food Insecurity (06/13/2023)   Hunger Vital Sign    Worried About Running Out of Food in the Last Year: Never true    Ran Out of Food in the Last Year: Never true  Transportation Needs: No Transportation Needs (06/13/2023)   PRAPARE - Administrator, Civil Service (Medical): No    Lack of Transportation  (Non-Medical): No  Physical Activity: Not on file  Stress: Not on file  Social Connections: Not on file  Intimate Partner Violence: Not At Risk (06/13/2023)   Humiliation, Afraid, Rape, and Kick questionnaire    Fear of Current or Ex-Partner: No    Emotionally Abused: No    Physically Abused: No    Sexually Abused: No      Review of Systems  Constitutional: Negative.   HENT: Negative.    Respiratory: Negative.  Negative for cough, chest tightness, shortness of breath and wheezing.   Cardiovascular: Negative.  Negative for chest pain and palpitations.  Musculoskeletal: Negative.  Negative for arthralgias.  Psychiatric/Behavioral:  Positive for behavioral problems and sleep disturbance. Negative for self-injury. The patient  is nervous/anxious.     Vital Signs: BP 128/76   Pulse 75   Temp (!) 97.1 F (36.2 C)   Resp 16   Ht 6' 5 (1.956 m)   Wt 223 lb 12.8 oz (101.5 kg)   SpO2 95%   BMI 26.54 kg/m    Physical Exam Vitals reviewed.  Constitutional:      General: He is not in acute distress.    Appearance: Normal appearance. He is not ill-appearing.  HENT:     Head: Normocephalic and atraumatic.  Eyes:     Pupils: Pupils are equal, round, and reactive to light.  Cardiovascular:     Rate and Rhythm: Normal rate and regular rhythm.  Pulmonary:     Effort: Pulmonary effort is normal. No respiratory distress.  Skin:    Capillary Refill: Capillary refill takes less than 2 seconds.  Neurological:     Mental Status: He is alert and oriented to person, place, and time.  Psychiatric:        Mood and Affect: Mood normal.        Behavior: Behavior normal.        Assessment/Plan: 1. Insomnia due to medical condition (Primary) Continue trazodone  as prescribed.   2. Coronary artery disease involving native coronary artery of native heart with unstable angina pectoris (HCC) Continue medications as prescribed and follow up with cardiology.   3. Centrilobular emphysema  (HCC) Continue breztri inhaler as prescribed.   4. Situational depression Slightly improved, continue trazodone  as prescribed.    General Counseling: elnathan fulford understanding of the findings of todays visit and agrees with plan of treatment. I have discussed any further diagnostic evaluation that may be needed or ordered today. We also reviewed his medications today. he has been encouraged to call the office with any questions or concerns that should arise related to todays visit.    No orders of the defined types were placed in this encounter.   No orders of the defined types were placed in this encounter.   Return in about 3 months (around 11/29/2023) for F/U, Timeka Goette PCP.   Total time spent:30 Minutes Time spent includes review of chart, medications, test results, and follow up plan with the patient.   [place] Controlled Substance Database was reviewed by me.  This patient was seen by Mardy Maxin, FNP-C in collaboration with Dr. Sigrid Bathe as a part of collaborative care agreement.   Cintya Daughety R. Maxin, MSN, FNP-C Internal medicine

## 2023-09-05 ENCOUNTER — Other Ambulatory Visit: Payer: Self-pay | Admitting: Cardiovascular Disease

## 2023-09-23 ENCOUNTER — Encounter: Payer: Self-pay | Admitting: Nurse Practitioner

## 2023-10-23 ENCOUNTER — Other Ambulatory Visit: Payer: Self-pay

## 2023-10-23 DIAGNOSIS — E785 Hyperlipidemia, unspecified: Secondary | ICD-10-CM

## 2023-10-23 DIAGNOSIS — I2510 Atherosclerotic heart disease of native coronary artery without angina pectoris: Secondary | ICD-10-CM

## 2023-11-02 DIAGNOSIS — I2510 Atherosclerotic heart disease of native coronary artery without angina pectoris: Secondary | ICD-10-CM | POA: Diagnosis not present

## 2023-11-02 DIAGNOSIS — E785 Hyperlipidemia, unspecified: Secondary | ICD-10-CM | POA: Diagnosis not present

## 2023-11-03 LAB — LIPID PANEL
Chol/HDL Ratio: 4 ratio (ref 0.0–5.0)
Cholesterol, Total: 152 mg/dL (ref 100–199)
HDL: 38 mg/dL — ABNORMAL LOW (ref >39)
LDL Chol Calc (NIH): 88 mg/dL (ref 0–99)
Triglycerides: 147 mg/dL (ref 0–149)
VLDL Cholesterol Cal: 26 mg/dL (ref 5–40)

## 2023-11-03 LAB — HEPATIC FUNCTION PANEL
ALT: 17 IU/L (ref 0–44)
AST: 28 IU/L (ref 0–40)
Albumin: 4.4 g/dL (ref 3.9–4.9)
Alkaline Phosphatase: 83 IU/L (ref 44–121)
Bilirubin Total: 0.3 mg/dL (ref 0.0–1.2)
Bilirubin, Direct: 0.12 mg/dL (ref 0.00–0.40)
Total Protein: 6.8 g/dL (ref 6.0–8.5)

## 2023-11-05 ENCOUNTER — Ambulatory Visit: Attending: Medical | Admitting: Medical

## 2023-11-05 ENCOUNTER — Encounter: Payer: Self-pay | Admitting: Medical

## 2023-11-05 VITALS — BP 130/60 | HR 60 | Ht 77.0 in | Wt 221.2 lb

## 2023-11-05 DIAGNOSIS — E785 Hyperlipidemia, unspecified: Secondary | ICD-10-CM | POA: Diagnosis not present

## 2023-11-05 DIAGNOSIS — Z720 Tobacco use: Secondary | ICD-10-CM | POA: Insufficient documentation

## 2023-11-05 DIAGNOSIS — I2510 Atherosclerotic heart disease of native coronary artery without angina pectoris: Secondary | ICD-10-CM | POA: Diagnosis not present

## 2023-11-05 MED ORDER — ISOSORBIDE MONONITRATE ER 30 MG PO TB24: 15.0000 mg | ORAL_TABLET | Freq: Every day | ORAL | 3 refills | Status: AC

## 2023-11-05 NOTE — Progress Notes (Signed)
 Cardiology Office Note   Date:  11/05/2023  ID:  Ryan Gallagher, DOB 05-05-1955, MRN [id number] PCP: Liana Fish, NP  [facility] Providers Cardiologist:  Deatrice Cage, MD   History of Present Illness Ryan Gallagher is a 68 y.o. male with a hx of COPD/tobacco use, CAD s/p PCI/DES Om1 05/2023, HLD who presents for follow-up of CAD.   Patient was seen 08/18/2022 as a new patient reporting dizziness, shortness of breath and fatigue.  He reported worsening exertional shortness of breath and fatigue over the last 6 months.  He also reported associated dizziness.  CT scan of the lungs in March 2024 showed evidence of aortic and 3 vessel coronary artery calcification.  Echo and cardiac CT were ordered. Cardiac CTA showed coronary calcium  score 492, 75th percentile for age and sex matched, severe proximal left circumflex stenosis greater than 70%, moderate proximal LAD stenosis. It was sent for FFR, which showed significant stenosis in the proximal left circumflex (FFR 0.78), and Cardiac cath was recommended. Cardiac cath showed moderate 60% stenosis in the mid left circumflex which was no significant by FFR, normal LVSF with mildly elevated LVEDP. No PCI was performed. Echo showed LVEF 55-60%, no WMA, aortic dilation measuring 40mm.    The patient presented 06/13/2023 with her lateral STEMI.  Cardiac cath showed 99% subtotal large OM1 that was treated with primary PCI/DES with excellent result.  Patient was started on DAPT with aspirin  and Brilinta .  Echo showed EF 60 to 65%, normal RV SF.  Patient was last seen 08/03/2023 reporting improvement off of statin.  He denied any anginal symptoms.  Today,the patient reports knee pain. It's from a past injury from many years ago. When he squats then knee hurts worse. Balance is worse. He has not fallen. He is still taking Crestor  5mg  daily and Zetia  10mg  daily. He has occasional chest tightness and SOB when he exerts. he  Studies Reviewed EKG  Interpretation Date/Time:  Monday November 05 2023 09:26:51 EDT Ventricular Rate:  60 PR Interval:  152 QRS Duration:  96 QT Interval:  394 QTC Calculation: 394 R Axis:   69  Text Interpretation: Normal sinus rhythm Low voltage QRS When compared with ECG of 03-Aug-2023 09:11, QT has shortened Confirmed by Franchester, Aquinnah Devin ([id number]) on 11/05/2023 9:39:20 AM    Echo 05/2023 1. Left ventricular ejection fraction, by estimation, is 60 to 65%. The  left ventricle has normal function. The left ventricle has no regional  wall motion abnormalities. Left ventricular diastolic parameters are  indeterminate. The average left  ventricular global longitudinal strain is -13.8 %. The global longitudinal  strain is abnormal.   2. Right ventricular systolic function is normal. The right ventricular  size is normal. There is normal pulmonary artery systolic pressure. The  estimated right ventricular systolic pressure is 22.1 mmHg.   3. The mitral valve is normal in structure. No evidence of mitral valve  regurgitation. No evidence of mitral stenosis.   4. The aortic valve is normal in structure. Aortic valve regurgitation is  not visualized. No aortic stenosis is present.   5. The inferior vena cava is normal in size with greater than 50%  respiratory variability, suggesting right atrial pressure of 3 mmHg.    LHC 05/2023   Mid RCA lesion is 20% stenosed.   Prox RCA lesion is 15% stenosed.   Mid LAD lesion is 40% stenosed.   1st Mrg lesion is 99% stenosed.   A stent was successfully placed.  A drug-eluting stent was successfully placed using a STENT ONYX FRONTIER 3.0X18.   Post intervention, there is a 0% residual stenosis.   The left ventricular systolic function is normal.   LV end diastolic pressure is mildly elevated.   The left ventricular ejection fraction is 50-55% by visual estimate.   1.  Lateral wall STEMI 2.  99% stenosis large OM1 3.  Normal left ventricular function 4.  Successful  primary PCI with 3.0 x 18 mm Onyx Frontier DES OM1   Recommendations   1.  Dual antiplatelet therapy uninterrupted x 1 year 2.  2D echocardiogram 3.  Resume rosuvastatin  40 mg daily 4.  Defer starting beta-blocker at this time due to baseline bradycardia  Physical Exam VS:  BP 130/60 (BP Location: Left Arm, Patient Position: Sitting, Cuff Size: Normal)   Pulse 60   Ht 6' 5 (1.956 m)   Wt 221 lb 3.2 oz (100.3 kg)   SpO2 98%   BMI 26.23 kg/m        Wt Readings from Last 3 Encounters:  11/05/23 221 lb 3.2 oz (100.3 kg)  08/29/23 223 lb 12.8 oz (101.5 kg)  08/03/23 221 lb (100.2 kg)    GEN: Well nourished, well developed in no acute distress NECK: No JVD; No carotid bruits CARDIAC: RRR, no murmurs, rubs, gallops RESPIRATORY:  Clear to auscultation without rales, wheezing or rhonchi  ABDOMEN: Soft, non-tender, non-distended EXTREMITIES:  No edema; No deformity   ASSESSMENT AND PLAN  CAD s/p PCI/DES OM1 05/2023 Patient reports stable angina.  He also has bruising on DAPT, but this is stable as well.  We discussed continuing DAPT 1 year post stent before stopping one antiplatelet.  Patient remains active at home.  I will start Imdur  15 mg daily.  Continue aspirin  81 mg daily and Effient  10 mg daily for 1 year.  Continue Zetia  10 mg daily and sublingual nitroglycerin .  Patient is on low-dose statin due to intolerance of higher statin dose.  No beta-blocker given borderline heart rate.  HLD LDL 88. Continue Crestor  5mg  daily and Zeita 10mg  daily. He did not tolerate higher doses of statin.   Tobacco use He is still smoking 3-4 cigarettes daily. He is trying to quit.        Dispo: Follow-up in 4 months  Signed, Taia Bramlett VEAR Fishman, PA-C

## 2023-11-05 NOTE — Patient Instructions (Signed)
 Medication Instructions:  Your physician recommends the following medication changes.  START TAKING: Imdur  15 mg by mouth daily    *If you need a refill on your cardiac medications before your next appointment, please call your pharmacy*  Lab Work: No labs ordered today    Testing/Procedures: No labs ordered today   Follow-Up: At [facility], you and your health needs are our priority.  As part of our continuing mission to provide you with exceptional heart care, our providers are all part of one team.  This team includes your primary Cardiologist (physician) and Advanced Practice Providers or APPs (Physician Assistants and Nurse Practitioners) who all work together to provide you with the care you need, when you need it.  Your next appointment:   4 month(s)  Provider:   Deatrice Cage, MD or Cadence Franchester, PA-C

## 2023-11-06 ENCOUNTER — Ambulatory Visit: Payer: Self-pay | Admitting: Cardiovascular Disease

## 2023-11-07 ENCOUNTER — Telehealth: Payer: Self-pay | Admitting: Pharmacy Technician

## 2023-11-07 ENCOUNTER — Other Ambulatory Visit (HOSPITAL_COMMUNITY): Payer: Self-pay

## 2023-11-07 NOTE — Telephone Encounter (Signed)
 Pharmacy Patient Advocate Encounter  Received notification from [employer] that Prior Authorization for repatha has been APPROVED from 03/28/23 to 03/26/24. Ran test claim, Copay is $416.96- one month (DEDUCTIBLE). This test claim was processed through [facility]- copay amounts may vary at other pharmacies due to pharmacy/plan contracts, or as the patient moves through the different stages of their insurance plan.   PA #/Case ID/Reference #: [id number]

## 2023-11-07 NOTE — Telephone Encounter (Signed)
 Pharmacy Patient Advocate Encounter   Received notification from Physician's Office (result notes) that prior authorization for repatha is required/requested.   Insurance verification completed.   The patient is insured through [employer] .   Per test claim: PA required; PA submitted to above mentioned insurance via Latent Key/confirmation #/EOC [id number] Status is pending

## 2023-11-13 ENCOUNTER — Other Ambulatory Visit (HOSPITAL_COMMUNITY): Payer: Self-pay

## 2023-11-13 ENCOUNTER — Telehealth: Payer: Self-pay | Admitting: Pharmacy Technician

## 2023-11-13 NOTE — Telephone Encounter (Signed)
 Patient Advocate Encounter   The patient was approved for a [employer] grant that will help cover the cost of Repatha Total amount awarded, 2500.00.  Effective: 10/14/23 - 10/12/24   APW:[id number] ERW:[id number] Hmnle:[id number] PI:[id number]  [employer] ID: [id number]   Pharmacy provided with approval and processing information. Patient informed via mychart

## 2023-11-27 MED ORDER — REPATHA SURECLICK 140 MG/ML ~~LOC~~ SOAJ: 140.0000 mg | SUBCUTANEOUS | 3 refills | Status: AC

## 2023-11-29 ENCOUNTER — Encounter: Payer: Self-pay | Admitting: Nurse Practitioner

## 2023-11-29 ENCOUNTER — Ambulatory Visit: Admitting: Nurse Practitioner

## 2023-11-29 VITALS — BP 134/76 | HR 69 | Temp 97.9°F | Resp 16 | Ht 77.0 in | Wt 221.0 lb

## 2023-11-29 DIAGNOSIS — M159 Polyosteoarthritis, unspecified: Secondary | ICD-10-CM

## 2023-11-29 DIAGNOSIS — J432 Centrilobular emphysema: Secondary | ICD-10-CM

## 2023-11-29 DIAGNOSIS — G720 Drug-induced myopathy: Secondary | ICD-10-CM

## 2023-11-29 DIAGNOSIS — T466X5A Adverse effect of antihyperlipidemic and antiarteriosclerotic drugs, initial encounter: Secondary | ICD-10-CM | POA: Insufficient documentation

## 2023-11-29 DIAGNOSIS — F321 Major depressive disorder, single episode, moderate: Secondary | ICD-10-CM

## 2023-11-29 DIAGNOSIS — R7303 Prediabetes: Secondary | ICD-10-CM | POA: Diagnosis not present

## 2023-11-29 MED ORDER — MELOXICAM 7.5 MG PO TABS: 7.5000 mg | ORAL_TABLET | Freq: Every day | ORAL | 3 refills | Status: DC

## 2023-11-29 MED ORDER — BREZTRI AEROSPHERE 160-9-4.8 MCG/ACT IN AERO: 2.0000 | INHALATION_SPRAY | Freq: Two times a day (BID) | RESPIRATORY_TRACT | Status: AC

## 2023-11-29 NOTE — Progress Notes (Signed)
 [facility] [address] [place], [place] [address]  Internal MEDICINE  Office Visit Note  Patient Name: Ryan Gallagher  [id number]  [id number]  Date of Service: 11/29/2023  Chief Complaint  Patient presents with   Gastroesophageal Reflux   Follow-up    HPI Terrie presents for a follow-up visit for insomnia, irritability, arthritis, statin myopathy.  Insomnia -- stopped trazodone  was giving him headaches. Has not tried doxylamine Otc yet. Has tried melatonin but this has not helped much.  Increased irritability -- has tried taking different antidepressants but these have not helps much. He does not get good quality sleep every night so this could be a contributing factor to changes in his mood.  Statin myopathy -- rosuvastatin  does was decreased from 20 mg down to 5 mg daily by cardiology due to muscle and joint pains. Cardiology is working on getting repatha  injections approved now.  Joint pain/arthritis -- has taken meloxicam  before which helps some, wants to try this again.     Current Medication: Outpatient Encounter Medications as of 11/29/2023  Medication Sig   budesonide-glycopyrrolate-formoterol (BREZTRI  AEROSPHERE) 160-9-4.8 MCG/ACT AERO inhaler Inhale 2 puffs into the lungs 2 (two) times daily.   [DISCONTINUED] rosuvastatin  (CRESTOR ) 20 MG tablet Take 20 mg by mouth daily.   albuterol  (VENTOLIN  HFA) 108 (90 Base) MCG/ACT inhaler Inhale 2 puffs into the lungs every 6 (six) hours as needed for wheezing or shortness of breath.   aspirin  EC 81 MG tablet Take 1 tablet (81 mg total) by mouth daily. Swallow whole.   aspirin -acetaminophen -caffeine  (EXCEDRIN  MIGRAINE) 250-250-65 MG per tablet Take 1 tablet by mouth every 6 (six) hours as needed for headache.   calcium  carbonate (TUMS - DOSED IN MG ELEMENTAL CALCIUM ) 500 MG chewable tablet Chew 1 tablet by mouth as needed for indigestion or heartburn.   diphenhydrAMINE HCl (BENADRYL ALLERGY PO) Take by mouth.    DIPHENHYDRAMINE HCL, TOPICAL, (BENADRYL ITCH STOPPING) 2 % GEL Apply topically.   Evolocumab  (REPATHA  SURECLICK) 140 MG/ML SOAJ Inject 140 mg into the skin every 14 (fourteen) days.   ezetimibe  (ZETIA ) 10 MG tablet Take 1 tablet (10 mg total) by mouth daily.   isosorbide  mononitrate (IMDUR ) 30 MG 24 hr tablet Take 0.5 tablets (15 mg total) by mouth daily.   Magnesium  Gluconate 500 (27 Mg) MG TABS Take 500 mg by mouth daily at 6 (six) AM.   meloxicam  (MOBIC ) 7.5 MG tablet Take 1 tablet (7.5 mg total) by mouth daily.   methocarbamol  (ROBAXIN ) 500 MG tablet Take 1 tablet (500 mg total) by mouth every 8 (eight) hours as needed for muscle spasms.   Multiple Vitamins-Minerals (MULTIVITAMIN WITH MINERALS) tablet Take 1 tablet by mouth daily.   nicotine  (NICODERM CQ  - DOSED IN MG/24 HOURS) 21 mg/24hr patch Place 1 patch (21 mg total) onto the skin daily as needed (nicotine  craving).   nitroGLYCERIN  (NITROSTAT ) 0.4 MG SL tablet Place 1 tablet (0.4 mg total) under the tongue every 5 (five) minutes as needed for chest pain.   prasugrel  (EFFIENT ) 10 MG TABS tablet Take 1 tablet (10 mg total) by mouth daily.   rosuvastatin  (CRESTOR ) 5 MG tablet Take 1 tablet (5 mg total) by mouth daily.   Varenicline  Tartrate, Starter, 0.5 MG X 11 & 1 MG X 42 TBPK Take 0.5 mg by mouth once daily on days 1-3, then take 0.5 mg twice daily on days 4-7, then increase to 1 mg twice daily   vitamin B-12 (CYANOCOBALAMIN ) 100 MCG tablet Take 100  mcg by mouth daily.   [DISCONTINUED] meloxicam  (MOBIC ) 7.5 MG tablet Take 1 tablet (7.5 mg total) by mouth daily.   [DISCONTINUED] traZODone  (DESYREL ) 50 MG tablet Take 1-2 tablets (50-100 mg total) by mouth at bedtime.   No facility-administered encounter medications on file as of 11/29/2023.    Surgical History: Past Surgical History:  Procedure Laterality Date   CATARACT EXTRACTION W/PHACO Left 11/29/2021   Procedure: CATARACT EXTRACTION PHACO AND INTRAOCULAR LENS PLACEMENT (IOC) LEFT;   Surgeon: Jaye Fallow, MD;  Location: [facility];  Service: Ophthalmology;  Laterality: Left;  6.28 1:07.0   CORONARY PRESSURE/FFR STUDY N/A 10/02/2022   Procedure: CORONARY PRESSURE/FFR STUDY;  Surgeon: Darron Deatrice LABOR, MD;  Location: [facility];  Service: Cardiovascular;  Laterality: N/A;   CORONARY/GRAFT ACUTE MI REVASCULARIZATION N/A 06/13/2023   Procedure: Coronary/Graft Acute MI Revascularization;  Surgeon: Ammon Blunt, MD;  Location: [facility];  Service: Cardiovascular;  Laterality: N/A;   HOLEP-LASER ENUCLEATION OF THE PROSTATE WITH MORCELLATION N/A 04/19/2018   Procedure: HOLEP-LASER ENUCLEATION OF THE PROSTATE WITH MORCELLATION;  Surgeon: Francisca Redell BROCKS, MD;  Location: [facility];  Service: Urology;  Laterality: N/A;   LEFT HEART CATH AND CORONARY ANGIOGRAPHY Left 10/02/2022   Procedure: LEFT HEART CATH AND CORONARY ANGIOGRAPHY;  Surgeon: Darron Deatrice LABOR, MD;  Location: [facility];  Service: Cardiovascular;  Laterality: Left;   LEFT HEART CATH AND CORONARY ANGIOGRAPHY N/A 06/13/2023   Procedure: LEFT HEART CATH AND CORONARY ANGIOGRAPHY;  Surgeon: Ammon Blunt, MD;  Location: [facility];  Service: Cardiovascular;  Laterality: N/A;   NO PAST SURGERIES      Medical History: Past Medical History:  Diagnosis Date   Arthritis    COPD, mild (HCC)    Diverticulitis    Enlarged prostate    GERD (gastroesophageal reflux disease)    OCC   Headache    MIGRAINES   History of hiatal hernia     Family History: Family History  Problem Relation Age of Onset   Alzheimer's disease Mother    Heart disease Father    Alzheimer's disease Father    Diabetes Paternal Uncle    Prostate cancer Neg Hx    Bladder Cancer Neg Hx    Kidney cancer Neg Hx     Social History   Socioeconomic History   Marital status: Single    Spouse name: Not on file   Number of children: Not on file   Years of education: Not on file    Highest education level: Not on file  Occupational History   Not on file  Tobacco Use   Smoking status: Every Day    Current packs/day: 0.50    Average packs/day: 0.5 packs/day for 50.0 years (25.0 ttl pk-yrs)    Types: Cigarettes   Smokeless tobacco: Never  Vaping Use   Vaping status: Never Used  Substance and Sexual Activity   Alcohol use: Yes    Comment: occ   Drug use: Never   Sexual activity: Yes    Birth control/protection: None  Other Topics Concern   Not on file  Social History Narrative   Not on file   Social Drivers of Health   Financial Resource Strain: Not on file  Food Insecurity: No Food Insecurity (06/13/2023)   Hunger Vital Sign    Worried About Running Out of Food in the Last Year: Never true    Ran Out of Food in the Last Year: Never true  Transportation  Needs: No Transportation Needs (06/13/2023)   PRAPARE - Administrator, Civil Service (Medical): No    Lack of Transportation (Non-Medical): No  Physical Activity: Not on file  Stress: Not on file  Social Connections: Not on file  Intimate Partner Violence: Not At Risk (06/13/2023)   Humiliation, Afraid, Rape, and Kick questionnaire    Fear of Current or Ex-Partner: No    Emotionally Abused: No    Physically Abused: No    Sexually Abused: No      Review of Systems  Constitutional: Negative.   HENT: Negative.    Respiratory: Negative.  Negative for cough, chest tightness, shortness of breath and wheezing.   Cardiovascular: Negative.  Negative for chest pain and palpitations.  Musculoskeletal: Negative.  Negative for arthralgias.  Psychiatric/Behavioral:  Positive for behavioral problems and sleep disturbance. Negative for self-injury. The patient is nervous/anxious.     Vital Signs: BP 134/76   Pulse 69   Temp 97.9 F (36.6 C)   Resp 16   Ht 6' 5 (1.956 m)   Wt 221 lb (100.2 kg)   SpO2 99%   BMI 26.21 kg/m    Physical Exam Vitals reviewed.  Constitutional:      General:  He is not in acute distress.    Appearance: Normal appearance. He is not ill-appearing.  HENT:     Head: Normocephalic and atraumatic.  Eyes:     Pupils: Pupils are equal, round, and reactive to light.  Cardiovascular:     Rate and Rhythm: Normal rate and regular rhythm.  Pulmonary:     Effort: Pulmonary effort is normal. No respiratory distress.  Musculoskeletal:        General: Injury: .diagme.  Skin:    Capillary Refill: Capillary refill takes less than 2 seconds.  Neurological:     Mental Status: He is alert and oriented to person, place, and time.  Psychiatric:        Mood and Affect: Mood normal.        Behavior: Behavior normal.        Assessment/Plan: 1. Centrilobular emphysema (HCC) (Primary) Continue breztri  as prescribed which is provided by pap [employer].  - budesonide-glycopyrrolate-formoterol (BREZTRI  AEROSPHERE) 160-9-4.8 MCG/ACT AERO inhaler; Inhale 2 puffs into the lungs 2 (two) times daily.  2. Prediabetes Will repeat A1c later   3. Generalized osteoarthritis Continue meloxicam  as prescribed  - meloxicam  (MOBIC ) 7.5 MG tablet; Take 1 tablet (7.5 mg total) by mouth daily.  Dispense: 30 tablet; Refill: 3  4. Statin myopathy Cardiology is working on getting the patient on repatha .   5. Current moderate episode of major depressive disorder without prior episode (HCC) We have tried a few antidepressants but none have helped much so far.     General Counseling: mishael haran understanding of the findings of todays visit and agrees with plan of treatment. I have discussed any further diagnostic evaluation that may be needed or ordered today. We also reviewed his medications today. he has been encouraged to call the office with any questions or concerns that should arise related to todays visit.    No orders of the defined types were placed in this encounter.   Meds ordered this encounter  Medications   budesonide-glycopyrrolate-formoterol (BREZTRI   AEROSPHERE) 160-9-4.8 MCG/ACT AERO inhaler    Sig: Inhale 2 puffs into the lungs 2 (two) times daily.    Patient receives this medication via patient assistance program, [employer].   meloxicam  (MOBIC ) 7.5 MG tablet  Sig: Take 1 tablet (7.5 mg total) by mouth daily.    Dispense:  30 tablet    Refill:  3    Return in about 3 months (around 02/28/2024) for F/U, Panayiota Larkin PCP.   Total time spent:30 Minutes Time spent includes review of chart, medications, test results, and follow up plan with the patient.   [place] Controlled Substance Database was reviewed by me.  This patient was seen by Mardy Maxin, FNP-C in collaboration with Dr. Sigrid Bathe as a part of collaborative care agreement.   Radwan Cowley R. Maxin, MSN, FNP-C Internal medicine

## 2023-12-03 ENCOUNTER — Encounter: Payer: Self-pay | Admitting: Nurse Practitioner

## 2023-12-16 DIAGNOSIS — Z23 Encounter for immunization: Secondary | ICD-10-CM | POA: Diagnosis not present

## 2024-02-28 ENCOUNTER — Encounter: Payer: Self-pay | Admitting: Nurse Practitioner

## 2024-02-28 ENCOUNTER — Ambulatory Visit: Admitting: Nurse Practitioner

## 2024-02-28 VITALS — BP 130/72 | HR 67 | Temp 96.8°F | Resp 16 | Ht 77.0 in | Wt 224.6 lb

## 2024-02-28 DIAGNOSIS — R7303 Prediabetes: Secondary | ICD-10-CM | POA: Diagnosis not present

## 2024-02-28 DIAGNOSIS — E559 Vitamin D deficiency, unspecified: Secondary | ICD-10-CM

## 2024-02-28 DIAGNOSIS — F17210 Nicotine dependence, cigarettes, uncomplicated: Secondary | ICD-10-CM | POA: Diagnosis not present

## 2024-02-28 DIAGNOSIS — G720 Drug-induced myopathy: Secondary | ICD-10-CM | POA: Diagnosis not present

## 2024-02-28 DIAGNOSIS — J432 Centrilobular emphysema: Secondary | ICD-10-CM

## 2024-02-28 DIAGNOSIS — E782 Mixed hyperlipidemia: Secondary | ICD-10-CM | POA: Diagnosis not present

## 2024-02-28 DIAGNOSIS — M159 Polyosteoarthritis, unspecified: Secondary | ICD-10-CM

## 2024-02-28 MED ORDER — MELOXICAM 7.5 MG PO TABS: 7.5000 mg | ORAL_TABLET | Freq: Every day | ORAL | 3 refills | Status: AC

## 2024-02-28 NOTE — Progress Notes (Unsigned)
 [facility] [address] [place], [place] [address]  Internal MEDICINE  Office Visit Note  Patient Name: Ryan Gallagher  [id number]  [id number]  Date of Service: 02/28/2024  Chief Complaint  Patient presents with   Gastroesophageal Reflux   Follow-up    HPI Taysen presents for a follow-up visit for high cholesterol and COPD High cholesterol -- on repatha  injections per cardiology.  Chronic joint pains Due for routine lung cancer screening, still smoking about 1/2 ppd.    Current Medication: Outpatient Encounter Medications as of 02/28/2024  Medication Sig   albuterol  (VENTOLIN  HFA) 108 (90 Base) MCG/ACT inhaler Inhale 2 puffs into the lungs every 6 (six) hours as needed for wheezing or shortness of breath.   aspirin  EC 81 MG tablet Take 1 tablet (81 mg total) by mouth daily. Swallow whole.   aspirin -acetaminophen -caffeine  (EXCEDRIN  MIGRAINE) 250-250-65 MG per tablet Take 1 tablet by mouth every 6 (six) hours as needed for headache.   budesonide-glycopyrrolate-formoterol (BREZTRI  AEROSPHERE) 160-9-4.8 MCG/ACT AERO inhaler Inhale 2 puffs into the lungs 2 (two) times daily.   calcium  carbonate (TUMS - DOSED IN MG ELEMENTAL CALCIUM ) 500 MG chewable tablet Chew 1 tablet by mouth as needed for indigestion or heartburn.   diphenhydrAMINE HCl (BENADRYL ALLERGY PO) Take by mouth.   DIPHENHYDRAMINE HCL, TOPICAL, (BENADRYL ITCH STOPPING) 2 % GEL Apply topically.   Evolocumab  (REPATHA  SURECLICK) 140 MG/ML SOAJ Inject 140 mg into the skin every 14 (fourteen) days.   ezetimibe  (ZETIA ) 10 MG tablet Take 1 tablet (10 mg total) by mouth daily.   isosorbide  mononitrate (IMDUR ) 30 MG 24 hr tablet Take 0.5 tablets (15 mg total) by mouth daily.   Magnesium  Gluconate 500 (27 Mg) MG TABS Take 500 mg by mouth daily at 6 (six) AM.   meloxicam  (MOBIC ) 7.5 MG tablet Take 1 tablet (7.5 mg total) by mouth daily.   methocarbamol  (ROBAXIN ) 500 MG tablet Take 1 tablet (500 mg total) by mouth  every 8 (eight) hours as needed for muscle spasms.   Multiple Vitamins-Minerals (MULTIVITAMIN WITH MINERALS) tablet Take 1 tablet by mouth daily.   nitroGLYCERIN  (NITROSTAT ) 0.4 MG SL tablet Place 1 tablet (0.4 mg total) under the tongue every 5 (five) minutes as needed for chest pain.   prasugrel  (EFFIENT ) 10 MG TABS tablet Take 1 tablet (10 mg total) by mouth daily.   rosuvastatin  (CRESTOR ) 5 MG tablet Take 1 tablet (5 mg total) by mouth daily.   vitamin B-12 (CYANOCOBALAMIN ) 100 MCG tablet Take 100 mcg by mouth daily.   [DISCONTINUED] meloxicam  (MOBIC ) 7.5 MG tablet Take 1 tablet (7.5 mg total) by mouth daily.   [DISCONTINUED] nicotine  (NICODERM CQ  - DOSED IN MG/24 HOURS) 21 mg/24hr patch Place 1 patch (21 mg total) onto the skin daily as needed (nicotine  craving).   [DISCONTINUED] Varenicline  Tartrate, Starter, 0.5 MG X 11 & 1 MG X 42 TBPK Take 0.5 mg by mouth once daily on days 1-3, then take 0.5 mg twice daily on days 4-7, then increase to 1 mg twice daily   No facility-administered encounter medications on file as of 02/28/2024.    Surgical History: Past Surgical History:  Procedure Laterality Date   CATARACT EXTRACTION W/PHACO Left 11/29/2021   Procedure: CATARACT EXTRACTION PHACO AND INTRAOCULAR LENS PLACEMENT (IOC) LEFT;  Surgeon: Jaye Fallow, MD;  Location: [facility];  Service: Ophthalmology;  Laterality: Left;  6.28 1:07.0   CORONARY PRESSURE/FFR STUDY N/A 10/02/2022   Procedure: CORONARY PRESSURE/FFR STUDY;  Surgeon: Darron Deatrice LABOR,  MD;  Location: [facility];  Service: Cardiovascular;  Laterality: N/A;   CORONARY/GRAFT ACUTE MI REVASCULARIZATION N/A 06/13/2023   Procedure: Coronary/Graft Acute MI Revascularization;  Surgeon: Ammon Blunt, MD;  Location: [facility];  Service: Cardiovascular;  Laterality: N/A;   HOLEP-LASER ENUCLEATION OF THE PROSTATE WITH MORCELLATION N/A 04/19/2018   Procedure: HOLEP-LASER ENUCLEATION OF THE PROSTATE WITH  MORCELLATION;  Surgeon: Francisca Redell BROCKS, MD;  Location: [facility];  Service: Urology;  Laterality: N/A;   LEFT HEART CATH AND CORONARY ANGIOGRAPHY Left 10/02/2022   Procedure: LEFT HEART CATH AND CORONARY ANGIOGRAPHY;  Surgeon: Darron Deatrice LABOR, MD;  Location: [facility];  Service: Cardiovascular;  Laterality: Left;   LEFT HEART CATH AND CORONARY ANGIOGRAPHY N/A 06/13/2023   Procedure: LEFT HEART CATH AND CORONARY ANGIOGRAPHY;  Surgeon: Ammon Blunt, MD;  Location: [facility];  Service: Cardiovascular;  Laterality: N/A;   NO PAST SURGERIES      Medical History: Past Medical History:  Diagnosis Date   Arthritis    COPD, mild (HCC)    Diverticulitis    Enlarged prostate    GERD (gastroesophageal reflux disease)    OCC   Headache    MIGRAINES   History of hiatal hernia     Family History: Family History  Problem Relation Age of Onset   Alzheimer's disease Mother    Heart disease Father    Alzheimer's disease Father    Diabetes Paternal Uncle    Prostate cancer Neg Hx    Bladder Cancer Neg Hx    Kidney cancer Neg Hx     Social History   Socioeconomic History   Marital status: Single    Spouse name: Not on file   Number of children: Not on file   Years of education: Not on file   Highest education level: Not on file  Occupational History   Not on file  Tobacco Use   Smoking status: Every Day    Current packs/day: 0.50    Average packs/day: 0.5 packs/day for 50.0 years (25.0 ttl pk-yrs)    Types: Cigarettes   Smokeless tobacco: Never  Vaping Use   Vaping status: Never Used  Substance and Sexual Activity   Alcohol use: Yes    Comment: occ   Drug use: Never   Sexual activity: Yes    Birth control/protection: None  Other Topics Concern   Not on file  Social History Narrative   Not on file   Social Drivers of Health   Financial Resource Strain: Not on file  Food Insecurity: No Food Insecurity (06/13/2023)   Hunger Vital Sign     Worried About Running Out of Food in the Last Year: Never true    Ran Out of Food in the Last Year: Never true  Transportation Needs: No Transportation Needs (06/13/2023)   PRAPARE - Administrator, Civil Service (Medical): No    Lack of Transportation (Non-Medical): No  Physical Activity: Not on file  Stress: Not on file  Social Connections: Not on file  Intimate Partner Violence: Not At Risk (06/13/2023)   Humiliation, Afraid, Rape, and Kick questionnaire    Fear of Current or Ex-Partner: No    Emotionally Abused: No    Physically Abused: No    Sexually Abused: No      Review of Systems  Vital Signs: BP 130/72   Pulse 67   Temp (!) 96.8 F (36 C)   Resp 16   Ht  6' 5 (1.956 m)   Wt 224 lb 9.6 oz (101.9 kg)   SpO2 97%   BMI 26.63 kg/m    Physical Exam     Assessment/Plan:   General Counseling: decarlos empey understanding of the findings of todays visit and agrees with plan of treatment. I have discussed any further diagnostic evaluation that may be needed or ordered today. We also reviewed his medications today. he has been encouraged to call the office with any questions or concerns that should arise related to todays visit.    Orders Placed This Encounter  Procedures   CT CHEST LCS NODULE F/U LOW DOSE WO CONTRAST   CBC with Differential/Platelet   CMP14+EGFR   Lipid Profile   Vitamin D  (25 hydroxy)   Hgb A1C w/o eAG    Meds ordered this encounter  Medications   meloxicam  (MOBIC ) 7.5 MG tablet    Sig: Take 1 tablet (7.5 mg total) by mouth daily.    Dispense:  30 tablet    Refill:  3    Return for previously scheduled, AWV, Darron Stuck PCP in february. have labs done before the visit.   Total time spent:*** Minutes Time spent includes review of chart, medications, test results, and follow up plan with the patient.   [place] Controlled Substance Database was reviewed by me.  This patient was seen by Mardy Maxin, FNP-C in collaboration  with Dr. Sigrid Bathe as a part of collaborative care agreement.   Katriona Schmierer R. Maxin, MSN, FNP-C Internal medicine

## 2024-02-29 ENCOUNTER — Encounter: Payer: Self-pay | Admitting: Nurse Practitioner

## 2024-03-04 ENCOUNTER — Ambulatory Visit: Attending: Medical | Admitting: Medical

## 2024-03-04 ENCOUNTER — Encounter: Payer: Self-pay | Admitting: Medical

## 2024-03-04 VITALS — BP 128/82 | HR 59 | Ht 77.0 in | Wt 227.2 lb

## 2024-03-04 DIAGNOSIS — J449 Chronic obstructive pulmonary disease, unspecified: Secondary | ICD-10-CM | POA: Diagnosis not present

## 2024-03-04 DIAGNOSIS — E782 Mixed hyperlipidemia: Secondary | ICD-10-CM | POA: Diagnosis not present

## 2024-03-04 DIAGNOSIS — I25110 Atherosclerotic heart disease of native coronary artery with unstable angina pectoris: Secondary | ICD-10-CM | POA: Diagnosis not present

## 2024-03-04 DIAGNOSIS — R0609 Other forms of dyspnea: Secondary | ICD-10-CM | POA: Diagnosis not present

## 2024-03-04 DIAGNOSIS — Z720 Tobacco use: Secondary | ICD-10-CM

## 2024-03-04 MED ORDER — RANOLAZINE ER 500 MG PO TB12: 500.0000 mg | ORAL_TABLET | Freq: Two times a day (BID) | ORAL | 3 refills | Status: AC

## 2024-03-04 MED ORDER — NITROGLYCERIN 0.4 MG SL SUBL: 0.4000 mg | SUBLINGUAL_TABLET | SUBLINGUAL | 3 refills | Status: AC | PRN

## 2024-03-04 NOTE — Progress Notes (Signed)
 Cardiology Office Note   Date:  03/04/2024  ID:  Ryan Gallagher, DOB 1955-11-29, MRN [id number] PCP: Liana Fish, NP  [facility] Providers Cardiologist:  Deatrice Cage, MD   History of Present Illness Ryan Gallagher is a 68 y.o. male with a hx of COPD/tobacco use, CAD s/p PCI/DES OM1 05/2023, HLD who presents for follow-up of CAD.   Patient was seen 08/18/2022 as a new patient reporting dizziness, shortness of breath and fatigue.  He reported worsening exertional shortness of breath and fatigue over the last 6 months.  He also reported associated dizziness.  CT scan of the lungs in March 2024 showed evidence of aortic and 3 vessel coronary artery calcification.  Echo and cardiac CT were ordered. Cardiac CTA showed coronary calcium  score 492, 75th percentile for age and sex matched, severe proximal left circumflex stenosis greater than 70%, moderate proximal LAD stenosis. It was sent for FFR, which showed significant stenosis in the proximal left circumflex (FFR 0.78), and Cardiac cath was recommended. Cardiac cath showed moderate 60% stenosis in the mid left circumflex which was no significant by FFR, normal LVSF with mildly elevated LVEDP. No PCI was performed. Echo showed LVEF 55-60%, no WMA, aortic dilation measuring 40mm.    The patient presented 06/13/2023 with her lateral STEMI.  Cardiac cath showed 99% subtotal large OM1 that was treated with primary PCI/DES with excellent result.  Patient was started on DAPT with aspirin  and Brilinta .  Echo showed EF 60 to 65%, normal RV SF.   The patient was last seen 11/05/23 reporting stable angina. No changes were made.  Today, the patient reports he feels breathing is getting worse. He feels he can't walk as far without getting SOB. He also has COPD and is unsure if the breathing is from his heart or his lungs. PCP is managing COPD.  He denies any chest pain. He does exercise, and walks 3-4 miles a day.   Studies Reviewed       Echo 05/2023 1. Left ventricular ejection fraction, by estimation, is 60 to 65%. The  left ventricle has normal function. The left ventricle has no regional  wall motion abnormalities. Left ventricular diastolic parameters are  indeterminate. The average left  ventricular global longitudinal strain is -13.8 %. The global longitudinal  strain is abnormal.   2. Right ventricular systolic function is normal. The right ventricular  size is normal. There is normal pulmonary artery systolic pressure. The  estimated right ventricular systolic pressure is 22.1 mmHg.   3. The mitral valve is normal in structure. No evidence of mitral valve  regurgitation. No evidence of mitral stenosis.   4. The aortic valve is normal in structure. Aortic valve regurgitation is  not visualized. No aortic stenosis is present.   5. The inferior vena cava is normal in size with greater than 50%  respiratory variability, suggesting right atrial pressure of 3 mmHg.    LHC 05/2023   Mid RCA lesion is 20% stenosed.   Prox RCA lesion is 15% stenosed.   Mid LAD lesion is 40% stenosed.   1st Mrg lesion is 99% stenosed.   A stent was successfully placed.   A drug-eluting stent was successfully placed using a STENT ONYX FRONTIER 3.0X18.   Post intervention, there is a 0% residual stenosis.   The left ventricular systolic function is normal.   LV end diastolic pressure is mildly elevated.   The left ventricular ejection fraction is 50-55% by visual estimate.   1.  Lateral wall STEMI 2.  99% stenosis large OM1 3.  Normal left ventricular function 4.  Successful primary PCI with 3.0 x 18 mm Onyx Frontier DES OM1   Recommendations   1.  Dual antiplatelet therapy uninterrupted x 1 year 2.  2D echocardiogram 3.  Resume rosuvastatin  40 mg daily 4.  Defer starting beta-blocker at this time due to baseline bradycardia       Physical Exam VS:  BP 128/82   Pulse (!) 59   Ht 6' 5 (1.956 m)   Wt 227 lb 3.2 oz (103.1  kg)   SpO2 96%   BMI 26.94 kg/m        Wt Readings from Last 3 Encounters:  03/04/24 227 lb 3.2 oz (103.1 kg)  02/28/24 224 lb 9.6 oz (101.9 kg)  11/29/23 221 lb (100.2 kg)    GEN: Well nourished, well developed in no acute distress NECK: No JVD; No carotid bruits CARDIAC: RRR, no murmurs, rubs, gallops RESPIRATORY:  Clear to auscultation without rales, wheezing or rhonchi  ABDOMEN: Soft, non-tender, non-distended EXTREMITIES:  No edema; No deformity   ASSESSMENT AND PLAN  DOE CAD s/p PCI/DES OM1 05/2023 The patient reports persistent DOE that he feels is getting worse. He does not have any chest pain. He did not tolerate Imdur  due to headaches. After a long discussion we decided to pursue a Cardiac PET stress test. We will try Ranexa  500mg  BID. Continue ASA 81mg , Effient  10mg  daily, Zetia , Repatha , and Crestor .   HLD LDL 91. Continue Crestor  5mg  daily, Zetia  10mg  daily and Repatha .   COPD Tobacco use PCP is managing COPD. Complete cessation recommended.    Dispo: Follow-up in 2 months  Signed, Birda Didonato VEAR Fishman, PA-C

## 2024-03-04 NOTE — Patient Instructions (Signed)
 Medication Instructions:  Your physician recommends the following medication changes.  START TAKING: Ranexa  500 mg by mouth twice a day   *If you need a refill on your cardiac medications before your next appointment, please call your pharmacy*  Lab Work: No labs ordered today    Testing/Procedures:   Please report to Radiology at [facility] Main Entrance, medical mall, 30 mins prior to your test.  [address]  [place], [place]  How to Prepare for Your Cardiac PET/CT Stress Test:  Nothing to eat or drink, except water, 3 hours prior to arrival time.  NO caffeine /decaffeinated products, or chocolate 12 hours prior to arrival. (Please note decaffeinated beverages (teas/coffees) still contain caffeine ).  If you have caffeine  within 12 hours prior, the test will need to be rescheduled.  Medication instructions: Do not take erectile dysfunction medications for 72 hours prior to test (sildenafil, tadalafil) Do not take nitrates (isosorbide  mononitrate, Ranexa ) the day before or day of test Do not take tamsulosin  the day before or morning of test Hold theophylline containing medications for 12 hours. Hold Dipyridamole 48 hours prior to the test.  Diabetic Preparation: If able to eat breakfast prior to 3 hour fasting, you may take all medications, including your insulin. Do not worry if you miss your breakfast dose of insulin - start at your next meal. If you do not eat prior to 3 hour fast-Hold all diabetes (oral and insulin) medications. Patients who wear a continuous glucose monitor MUST remove the device prior to scanning.  You may take your remaining medications with water.  NO perfume, cologne or lotion on chest or abdomen area. FEMALES - Please avoid wearing dresses to this appointment.  Total time is 1 to 2 hours; you may want to bring reading material for the waiting time.  IF YOU THINK YOU MAY BE PREGNANT, OR ARE NURSING PLEASE INFORM THE  TECHNOLOGIST.  In preparation for your appointment, medication and supplies will be purchased.  Appointment availability is limited, so if you need to cancel or reschedule, please call the Radiology Department Scheduler at [phone] 24 hours in advance to avoid a cancellation fee of $100.00  What to Expect When you Arrive:  Once you arrive and check in for your appointment, you will be taken to a preparation room within the Radiology Department.  A technologist or Nurse will obtain your medical history, verify that you are correctly prepped for the exam, and explain the procedure.  Afterwards, an IV will be started in your arm and electrodes will be placed on your skin for EKG monitoring during the stress portion of the exam. Then you will be escorted to the PET/CT scanner.  There, staff will get you positioned on the scanner and obtain a blood pressure and EKG.  During the exam, you will continue to be connected to the EKG and blood pressure machines.  A small, safe amount of a radioactive tracer will be injected in your IV to obtain a series of pictures of your heart along with an injection of a stress agent.    After your Exam:  It is recommended that you eat a meal and drink a caffeinated beverage to counter act any effects of the stress agent.  Drink plenty of fluids for the remainder of the day and urinate frequently for the first couple of hours after the exam.  Your doctor will inform you of your test results within 7-10 business days.  For more information and frequently asked  questions, please visit our website: [email or web address]  For questions about your test or how to prepare for your test, please call: Cardiac Imaging Nurse Navigators Office: [phone]   Follow-Up: At [facility], you and your health needs are our priority.  As part of our continuing mission to provide you with exceptional heart care, our providers are all part of one team.  This team  includes your primary Cardiologist (physician) and Advanced Practice Providers or APPs (Physician Assistants and Nurse Practitioners) who all work together to provide you with the care you need, when you need it.  Your next appointment:   2 month(s)  Provider:   Deatrice Cage, MD or Cadence Franchester, PA-C

## 2024-03-06 ENCOUNTER — Ambulatory Visit: Admitting: Medical

## 2024-03-18 NOTE — Addendum Note (Signed)
 Addended by: Jeani Fassnacht on: 03/18/2024 12:46 PM   Modules accepted: Orders

## 2024-03-25 ENCOUNTER — Inpatient Hospital Stay
Admission: RE | Admit: 2024-03-25 | Discharge: 2024-03-25 | Disposition: A | Source: Ambulatory Visit | Attending: Nurse Practitioner

## 2024-03-25 DIAGNOSIS — F17210 Nicotine dependence, cigarettes, uncomplicated: Secondary | ICD-10-CM

## 2024-04-04 ENCOUNTER — Telehealth: Payer: Self-pay

## 2024-04-09 NOTE — Telephone Encounter (Signed)
 Sent message to alyssa as per alyssa pt already  had appt to discuss ct

## 2024-04-10 ENCOUNTER — Ambulatory Visit: Payer: Self-pay | Admitting: Nurse Practitioner

## 2024-04-10 DIAGNOSIS — E010 Iodine-deficiency related diffuse (endemic) goiter: Secondary | ICD-10-CM

## 2024-04-10 NOTE — Progress Notes (Signed)
 Thyroid  gland is slightly enlarged on CT chest imaging, thyroid  ultrasound ordered

## 2024-04-10 NOTE — Telephone Encounter (Signed)
 Left message for patient to give office a call back.

## 2024-04-10 NOTE — Telephone Encounter (Signed)
 Patient wife notified.

## 2024-04-10 NOTE — Telephone Encounter (Signed)
-----   Message from Mardy Maxin, NP sent at 04/10/2024  9:17 AM EST ----- Thyroid  gland is slightly enlarged on CT chest imaging, thyroid  ultrasound ordered

## 2024-04-23 ENCOUNTER — Encounter (HOSPITAL_COMMUNITY): Payer: Self-pay

## 2024-04-24 ENCOUNTER — Ambulatory Visit
Admission: RE | Admit: 2024-04-24 | Discharge: 2024-04-24 | Disposition: A | Discharge status: Home / Self Care | Source: Ambulatory Visit | Attending: Medical | Admitting: Medical

## 2024-04-24 ENCOUNTER — Ambulatory Visit: Payer: Self-pay | Admitting: Medical

## 2024-04-24 DIAGNOSIS — Z122 Encounter for screening for malignant neoplasm of respiratory organs: Secondary | ICD-10-CM | POA: Diagnosis present

## 2024-04-24 DIAGNOSIS — R0609 Other forms of dyspnea: Secondary | ICD-10-CM | POA: Diagnosis not present

## 2024-04-24 DIAGNOSIS — I77810 Thoracic aortic ectasia: Secondary | ICD-10-CM | POA: Diagnosis not present

## 2024-04-24 DIAGNOSIS — R001 Bradycardia, unspecified: Secondary | ICD-10-CM | POA: Insufficient documentation

## 2024-04-24 LAB — NM PET CT CARDIAC PERFUSION MULTI W/ABSOLUTE BLOODFLOW
MBFR: 2.75
Nuc Rest EF: 54 %
Nuc Stress EF: 66 %
Rest MBF: 0.63 ml/g/min
Rest Nuclear Isotope Dose: 25.1 mCi
SRS: 1
SSS: 1
ST Depression (mm): 0 mm
Stress MBF: 1.73 ml/g/min
Stress Nuclear Isotope Dose: 25 mCi
TID: 1.1

## 2024-04-24 MED ORDER — REGADENOSON 0.4 MG/5ML IV SOLN
INTRAVENOUS | Status: AC
  Filled 2024-04-24: qty 5

## 2024-04-24 MED ORDER — REGADENOSON 0.4 MG/5ML IV SOLN
0.4000 mg | Freq: Once | INTRAVENOUS | Status: AC
  Administered 2024-04-24: 0.4 mg via INTRAVENOUS
  Filled 2024-04-24: qty 5

## 2024-04-24 MED ORDER — RUBIDIUM RB82 GENERATOR (RUBYFILL)
25.0000 | PACK | Freq: Once | INTRAVENOUS | Status: AC
  Administered 2024-04-24: 24.99 via INTRAVENOUS

## 2024-04-24 MED ORDER — RUBIDIUM RB82 GENERATOR (RUBYFILL)
25.0000 | PACK | Freq: Once | INTRAVENOUS | Status: AC
  Administered 2024-04-24: 25.13 via INTRAVENOUS

## 2024-05-07 ENCOUNTER — Ambulatory Visit: Admitting: Medical

## 2024-05-08 ENCOUNTER — Ambulatory Visit: Admitting: Medical

## 2024-05-12 ENCOUNTER — Ambulatory Visit: Payer: Medicare Other | Admitting: Nurse Practitioner
